# Patient Record
Sex: Female | Born: 1950 | Race: White | Hispanic: No | Marital: Married | Smoking: Current every day smoker
Health system: Southern US, Community
[De-identification: ages and names within clinical notes are randomized; demographics above are authoritative.]

## PROBLEM LIST (undated history)

## (undated) DIAGNOSIS — H269 Unspecified cataract: Secondary | ICD-10-CM

## (undated) DIAGNOSIS — S62109A Fracture of unspecified carpal bone, unspecified wrist, initial encounter for closed fracture: Secondary | ICD-10-CM

## (undated) DIAGNOSIS — D126 Benign neoplasm of colon, unspecified: Secondary | ICD-10-CM

## (undated) DIAGNOSIS — E785 Hyperlipidemia, unspecified: Secondary | ICD-10-CM

## (undated) DIAGNOSIS — M5030 Other cervical disc degeneration, unspecified cervical region: Secondary | ICD-10-CM

## (undated) DIAGNOSIS — N6019 Diffuse cystic mastopathy of unspecified breast: Secondary | ICD-10-CM

## (undated) DIAGNOSIS — K219 Gastro-esophageal reflux disease without esophagitis: Secondary | ICD-10-CM

## (undated) DIAGNOSIS — R55 Syncope and collapse: Secondary | ICD-10-CM

## (undated) DIAGNOSIS — I10 Essential (primary) hypertension: Secondary | ICD-10-CM

## (undated) DIAGNOSIS — J439 Emphysema, unspecified: Secondary | ICD-10-CM

## (undated) DIAGNOSIS — T7840XA Allergy, unspecified, initial encounter: Secondary | ICD-10-CM

## (undated) DIAGNOSIS — N3010 Interstitial cystitis (chronic) without hematuria: Secondary | ICD-10-CM

## (undated) DIAGNOSIS — F419 Anxiety disorder, unspecified: Secondary | ICD-10-CM

## (undated) DIAGNOSIS — D649 Anemia, unspecified: Secondary | ICD-10-CM

## (undated) DIAGNOSIS — M8580 Other specified disorders of bone density and structure, unspecified site: Secondary | ICD-10-CM

## (undated) DIAGNOSIS — J302 Other seasonal allergic rhinitis: Secondary | ICD-10-CM

## (undated) HISTORY — DX: Benign neoplasm of colon, unspecified: D12.6

## (undated) HISTORY — DX: Hyperlipidemia, unspecified: E78.5

## (undated) HISTORY — PX: WISDOM TOOTH EXTRACTION: SHX21

## (undated) HISTORY — PX: CATARACT EXTRACTION, BILATERAL: SHX1313

## (undated) HISTORY — DX: Interstitial cystitis (chronic) without hematuria: N30.10

## (undated) HISTORY — DX: Other seasonal allergic rhinitis: J30.2

## (undated) HISTORY — PX: COLONOSCOPY W/ POLYPECTOMY: SHX1380

## (undated) HISTORY — DX: Unspecified cataract: H26.9

## (undated) HISTORY — DX: Anemia, unspecified: D64.9

## (undated) HISTORY — PX: CERVICAL FUSION: SHX112

## (undated) HISTORY — DX: Fracture of unspecified carpal bone, unspecified wrist, initial encounter for closed fracture: S62.109A

## (undated) HISTORY — DX: Other specified disorders of bone density and structure, unspecified site: M85.80

## (undated) HISTORY — DX: Essential (primary) hypertension: I10

## (undated) HISTORY — DX: Syncope and collapse: R55

## (undated) HISTORY — DX: Emphysema, unspecified: J43.9

## (undated) HISTORY — PX: OTHER SURGICAL HISTORY: SHX169

## (undated) HISTORY — DX: Gastro-esophageal reflux disease without esophagitis: K21.9

## (undated) HISTORY — DX: Allergy, unspecified, initial encounter: T78.40XA

## (undated) HISTORY — DX: Anxiety disorder, unspecified: F41.9

## (undated) HISTORY — PX: TUBAL LIGATION: SHX77

## (undated) HISTORY — PX: SEPTOPLASTY: SUR1290

## (undated) HISTORY — DX: Other cervical disc degeneration, unspecified cervical region: M50.30

## (undated) HISTORY — DX: Diffuse cystic mastopathy of unspecified breast: N60.19

---

## 1994-07-14 HISTORY — PX: ABDOMINAL HYSTERECTOMY: SHX81

## 1997-12-01 ENCOUNTER — Other Ambulatory Visit: Admission: RE | Admit: 1997-12-01 | Discharge: 1997-12-01 | Payer: Self-pay | Admitting: Obstetrics and Gynecology

## 1998-05-30 ENCOUNTER — Encounter: Payer: Self-pay | Admitting: Emergency Medicine

## 1998-05-30 ENCOUNTER — Emergency Department (HOSPITAL_COMMUNITY): Admission: EM | Admit: 1998-05-30 | Discharge: 1998-05-30 | Payer: Self-pay | Admitting: Emergency Medicine

## 1998-12-17 ENCOUNTER — Other Ambulatory Visit: Admission: RE | Admit: 1998-12-17 | Discharge: 1998-12-17 | Payer: Self-pay | Admitting: Obstetrics and Gynecology

## 1999-08-12 ENCOUNTER — Inpatient Hospital Stay (HOSPITAL_COMMUNITY): Admission: RE | Admit: 1999-08-12 | Discharge: 1999-08-13 | Payer: Self-pay | Admitting: *Deleted

## 1999-08-12 ENCOUNTER — Encounter: Payer: Self-pay | Admitting: *Deleted

## 1999-09-18 ENCOUNTER — Encounter: Payer: Self-pay | Admitting: *Deleted

## 1999-09-18 ENCOUNTER — Ambulatory Visit (HOSPITAL_COMMUNITY): Admission: RE | Admit: 1999-09-18 | Discharge: 1999-09-18 | Payer: Self-pay | Admitting: *Deleted

## 1999-10-09 ENCOUNTER — Encounter: Payer: Self-pay | Admitting: *Deleted

## 1999-10-09 ENCOUNTER — Ambulatory Visit (HOSPITAL_COMMUNITY): Admission: RE | Admit: 1999-10-09 | Discharge: 1999-10-09 | Payer: Self-pay | Admitting: *Deleted

## 1999-12-04 ENCOUNTER — Encounter: Payer: Self-pay | Admitting: *Deleted

## 1999-12-04 ENCOUNTER — Ambulatory Visit (HOSPITAL_COMMUNITY): Admission: RE | Admit: 1999-12-04 | Discharge: 1999-12-04 | Payer: Self-pay | Admitting: *Deleted

## 1999-12-26 ENCOUNTER — Other Ambulatory Visit: Admission: RE | Admit: 1999-12-26 | Discharge: 1999-12-26 | Payer: Self-pay | Admitting: Obstetrics and Gynecology

## 2000-05-30 ENCOUNTER — Emergency Department (HOSPITAL_COMMUNITY): Admission: EM | Admit: 2000-05-30 | Discharge: 2000-05-30 | Payer: Self-pay | Admitting: Emergency Medicine

## 2000-05-30 ENCOUNTER — Encounter: Payer: Self-pay | Admitting: Emergency Medicine

## 2000-07-25 ENCOUNTER — Emergency Department (HOSPITAL_COMMUNITY): Admission: EM | Admit: 2000-07-25 | Discharge: 2000-07-25 | Payer: Self-pay | Admitting: *Deleted

## 2000-12-28 ENCOUNTER — Other Ambulatory Visit: Admission: RE | Admit: 2000-12-28 | Discharge: 2000-12-28 | Payer: Self-pay | Admitting: Obstetrics and Gynecology

## 2003-09-14 ENCOUNTER — Emergency Department (HOSPITAL_COMMUNITY): Admission: EM | Admit: 2003-09-14 | Discharge: 2003-09-15 | Payer: Self-pay

## 2004-09-13 ENCOUNTER — Ambulatory Visit: Payer: Self-pay | Admitting: Internal Medicine

## 2004-09-16 ENCOUNTER — Ambulatory Visit: Payer: Self-pay | Admitting: Internal Medicine

## 2005-07-14 DIAGNOSIS — R55 Syncope and collapse: Secondary | ICD-10-CM

## 2005-07-14 HISTORY — DX: Syncope and collapse: R55

## 2005-09-12 ENCOUNTER — Ambulatory Visit: Payer: Self-pay | Admitting: Internal Medicine

## 2005-09-30 ENCOUNTER — Emergency Department (HOSPITAL_COMMUNITY): Admission: EM | Admit: 2005-09-30 | Discharge: 2005-09-30 | Payer: Self-pay | Admitting: Emergency Medicine

## 2005-10-01 ENCOUNTER — Ambulatory Visit: Payer: Self-pay | Admitting: Internal Medicine

## 2005-10-16 ENCOUNTER — Ambulatory Visit: Payer: Self-pay | Admitting: Internal Medicine

## 2005-10-28 ENCOUNTER — Encounter (INDEPENDENT_AMBULATORY_CARE_PROVIDER_SITE_OTHER): Payer: Self-pay | Admitting: Specialist

## 2005-10-28 ENCOUNTER — Ambulatory Visit: Payer: Self-pay | Admitting: Internal Medicine

## 2006-09-09 ENCOUNTER — Ambulatory Visit: Payer: Self-pay | Admitting: Internal Medicine

## 2006-09-09 LAB — CONVERTED CEMR LAB
ALT: 22 units/L (ref 0–40)
Basophils Absolute: 0 10*3/uL (ref 0.0–0.1)
CO2: 29 meq/L (ref 19–32)
Calcium: 9.4 mg/dL (ref 8.4–10.5)
Cholesterol: 195 mg/dL (ref 0–200)
Eosinophils Absolute: 0.1 10*3/uL (ref 0.0–0.6)
Eosinophils Relative: 0.7 % (ref 0.0–5.0)
Free T4: 0.8 ng/dL (ref 0.6–1.6)
GFR calc non Af Amer: 92 mL/min
HDL: 63.5 mg/dL (ref >39.0)
MCV: 92.9 fL (ref 78.0–100.0)
Monocytes Absolute: 0.3 10*3/uL (ref 0.2–0.7)
Monocytes Relative: 4.7 % (ref 3.0–11.0)
Neutro Abs: 4.8 10*3/uL (ref 1.4–7.7)
Potassium: 4.1 meq/L (ref 3.5–5.1)
Sodium: 142 meq/L (ref 135–145)
T3, Free: 3 pg/mL (ref 2.3–4.2)
TSH: 0.9 microintl units/mL (ref 0.35–5.50)
Total CHOL/HDL Ratio: 3.1

## 2006-09-21 ENCOUNTER — Ambulatory Visit: Payer: Self-pay | Admitting: Internal Medicine

## 2006-12-01 ENCOUNTER — Ambulatory Visit: Payer: Self-pay | Admitting: Family Medicine

## 2006-12-17 ENCOUNTER — Encounter: Payer: Self-pay | Admitting: Internal Medicine

## 2007-05-24 ENCOUNTER — Telehealth (INDEPENDENT_AMBULATORY_CARE_PROVIDER_SITE_OTHER): Payer: Self-pay | Admitting: *Deleted

## 2007-05-27 DIAGNOSIS — N809 Endometriosis, unspecified: Secondary | ICD-10-CM | POA: Insufficient documentation

## 2007-05-27 DIAGNOSIS — N83209 Unspecified ovarian cyst, unspecified side: Secondary | ICD-10-CM | POA: Insufficient documentation

## 2007-05-27 DIAGNOSIS — M949 Disorder of cartilage, unspecified: Secondary | ICD-10-CM

## 2007-05-27 DIAGNOSIS — I10 Essential (primary) hypertension: Secondary | ICD-10-CM | POA: Insufficient documentation

## 2007-05-27 DIAGNOSIS — M899 Disorder of bone, unspecified: Secondary | ICD-10-CM | POA: Insufficient documentation

## 2007-06-25 ENCOUNTER — Encounter: Payer: Self-pay | Admitting: Internal Medicine

## 2007-07-23 ENCOUNTER — Encounter: Payer: Self-pay | Admitting: Internal Medicine

## 2007-07-29 ENCOUNTER — Ambulatory Visit: Payer: Self-pay | Admitting: Internal Medicine

## 2007-07-29 DIAGNOSIS — N3010 Interstitial cystitis (chronic) without hematuria: Secondary | ICD-10-CM | POA: Insufficient documentation

## 2008-03-07 ENCOUNTER — Ambulatory Visit: Payer: Self-pay | Admitting: Internal Medicine

## 2008-03-07 DIAGNOSIS — R1310 Dysphagia, unspecified: Secondary | ICD-10-CM

## 2008-03-07 DIAGNOSIS — Z720 Tobacco use: Secondary | ICD-10-CM

## 2008-03-07 DIAGNOSIS — F17210 Nicotine dependence, cigarettes, uncomplicated: Secondary | ICD-10-CM | POA: Insufficient documentation

## 2008-03-07 DIAGNOSIS — E785 Hyperlipidemia, unspecified: Secondary | ICD-10-CM | POA: Insufficient documentation

## 2008-03-07 DIAGNOSIS — R1319 Other dysphagia: Secondary | ICD-10-CM | POA: Insufficient documentation

## 2008-03-16 ENCOUNTER — Ambulatory Visit: Payer: Self-pay | Admitting: Internal Medicine

## 2008-03-17 ENCOUNTER — Ambulatory Visit: Payer: Self-pay | Admitting: Internal Medicine

## 2008-03-17 ENCOUNTER — Encounter (INDEPENDENT_AMBULATORY_CARE_PROVIDER_SITE_OTHER): Payer: Self-pay | Admitting: *Deleted

## 2008-03-17 LAB — CONVERTED CEMR LAB: OCCULT 1: NEGATIVE

## 2008-03-18 ENCOUNTER — Encounter (INDEPENDENT_AMBULATORY_CARE_PROVIDER_SITE_OTHER): Payer: Self-pay | Admitting: *Deleted

## 2008-03-21 ENCOUNTER — Encounter (INDEPENDENT_AMBULATORY_CARE_PROVIDER_SITE_OTHER): Payer: Self-pay | Admitting: *Deleted

## 2008-03-21 ENCOUNTER — Telehealth (INDEPENDENT_AMBULATORY_CARE_PROVIDER_SITE_OTHER): Payer: Self-pay | Admitting: *Deleted

## 2008-03-22 ENCOUNTER — Encounter: Admission: RE | Admit: 2008-03-22 | Discharge: 2008-03-22 | Payer: Self-pay | Admitting: Obstetrics and Gynecology

## 2008-03-27 ENCOUNTER — Encounter: Admission: RE | Admit: 2008-03-27 | Discharge: 2008-03-27 | Payer: Self-pay | Admitting: Internal Medicine

## 2008-03-28 ENCOUNTER — Telehealth (INDEPENDENT_AMBULATORY_CARE_PROVIDER_SITE_OTHER): Payer: Self-pay | Admitting: *Deleted

## 2008-03-28 ENCOUNTER — Encounter (INDEPENDENT_AMBULATORY_CARE_PROVIDER_SITE_OTHER): Payer: Self-pay | Admitting: *Deleted

## 2008-03-28 DIAGNOSIS — E041 Nontoxic single thyroid nodule: Secondary | ICD-10-CM | POA: Insufficient documentation

## 2008-04-06 ENCOUNTER — Encounter: Admission: RE | Admit: 2008-04-06 | Discharge: 2008-04-06 | Payer: Self-pay | Admitting: Internal Medicine

## 2008-04-11 ENCOUNTER — Telehealth (INDEPENDENT_AMBULATORY_CARE_PROVIDER_SITE_OTHER): Payer: Self-pay | Admitting: *Deleted

## 2008-09-14 ENCOUNTER — Encounter: Admission: RE | Admit: 2008-09-14 | Discharge: 2008-09-14 | Payer: Self-pay | Admitting: Obstetrics and Gynecology

## 2008-09-19 ENCOUNTER — Ambulatory Visit: Payer: Self-pay | Admitting: Internal Medicine

## 2008-09-19 ENCOUNTER — Telehealth (INDEPENDENT_AMBULATORY_CARE_PROVIDER_SITE_OTHER): Payer: Self-pay | Admitting: *Deleted

## 2008-09-19 DIAGNOSIS — Z860100 Personal history of colon polyps, unspecified: Secondary | ICD-10-CM | POA: Insufficient documentation

## 2008-09-19 DIAGNOSIS — F411 Generalized anxiety disorder: Secondary | ICD-10-CM | POA: Insufficient documentation

## 2008-11-16 ENCOUNTER — Encounter: Payer: Self-pay | Admitting: Internal Medicine

## 2009-01-16 ENCOUNTER — Encounter: Payer: Self-pay | Admitting: Internal Medicine

## 2009-01-16 ENCOUNTER — Ambulatory Visit: Payer: Self-pay | Admitting: Family Medicine

## 2009-02-05 ENCOUNTER — Encounter (INDEPENDENT_AMBULATORY_CARE_PROVIDER_SITE_OTHER): Payer: Self-pay | Admitting: *Deleted

## 2009-03-20 ENCOUNTER — Encounter: Payer: Self-pay | Admitting: Internal Medicine

## 2009-03-20 ENCOUNTER — Ambulatory Visit: Payer: Self-pay | Admitting: Internal Medicine

## 2009-03-20 DIAGNOSIS — Z8669 Personal history of other diseases of the nervous system and sense organs: Secondary | ICD-10-CM | POA: Insufficient documentation

## 2009-03-22 ENCOUNTER — Encounter (INDEPENDENT_AMBULATORY_CARE_PROVIDER_SITE_OTHER): Payer: Self-pay | Admitting: *Deleted

## 2009-03-25 LAB — CONVERTED CEMR LAB
ALT: 16 units/L (ref 0–35)
CO2: 29 meq/L (ref 19–32)
Calcium: 9.2 mg/dL (ref 8.4–10.5)
Cholesterol: 189 mg/dL (ref 0–200)
Creatinine, Ser: 0.8 mg/dL (ref 0.4–1.2)
Eosinophils Relative: 1.1 % (ref 0.0–5.0)
GFR calc non Af Amer: 78.01 mL/min (ref >60)
Glucose, Bld: 82 mg/dL (ref 70–99)
HCT: 45.9 % (ref 36.0–46.0)
Hemoglobin: 15.7 g/dL — ABNORMAL HIGH (ref 12.0–15.0)
LDL Cholesterol: 112 mg/dL — ABNORMAL HIGH (ref 0–99)
Lymphs Abs: 2.1 10*3/uL (ref 0.7–4.0)
MCHC: 34.3 g/dL (ref 30.0–36.0)
Neutro Abs: 5.1 10*3/uL (ref 1.4–7.7)
Platelets: 165 10*3/uL (ref 150.0–400.0)
Total Protein: 7.2 g/dL (ref 6.0–8.3)
Vit D, 25-Hydroxy: 40 ng/mL (ref 30–89)
WBC: 7.7 10*3/uL (ref 4.5–10.5)

## 2009-03-27 ENCOUNTER — Encounter (INDEPENDENT_AMBULATORY_CARE_PROVIDER_SITE_OTHER): Payer: Self-pay | Admitting: *Deleted

## 2009-03-28 ENCOUNTER — Ambulatory Visit: Payer: Self-pay | Admitting: Internal Medicine

## 2009-03-28 ENCOUNTER — Encounter (INDEPENDENT_AMBULATORY_CARE_PROVIDER_SITE_OTHER): Payer: Self-pay | Admitting: *Deleted

## 2009-03-28 LAB — CONVERTED CEMR LAB: OCCULT 3: NEGATIVE

## 2009-04-02 ENCOUNTER — Telehealth (INDEPENDENT_AMBULATORY_CARE_PROVIDER_SITE_OTHER): Payer: Self-pay | Admitting: *Deleted

## 2009-04-03 ENCOUNTER — Encounter: Admission: RE | Admit: 2009-04-03 | Discharge: 2009-04-03 | Payer: Self-pay | Admitting: Obstetrics and Gynecology

## 2009-04-23 ENCOUNTER — Ambulatory Visit: Payer: Self-pay | Admitting: Internal Medicine

## 2009-05-01 ENCOUNTER — Ambulatory Visit (HOSPITAL_COMMUNITY): Admission: RE | Admit: 2009-05-01 | Discharge: 2009-05-01 | Payer: Self-pay | Admitting: Internal Medicine

## 2009-05-02 ENCOUNTER — Encounter (INDEPENDENT_AMBULATORY_CARE_PROVIDER_SITE_OTHER): Payer: Self-pay

## 2009-05-04 ENCOUNTER — Ambulatory Visit: Payer: Self-pay | Admitting: Internal Medicine

## 2009-05-15 ENCOUNTER — Ambulatory Visit: Payer: Self-pay | Admitting: Internal Medicine

## 2009-05-15 ENCOUNTER — Encounter: Payer: Self-pay | Admitting: Internal Medicine

## 2009-05-20 ENCOUNTER — Encounter: Payer: Self-pay | Admitting: Internal Medicine

## 2009-05-24 ENCOUNTER — Encounter: Payer: Self-pay | Admitting: Internal Medicine

## 2009-09-18 ENCOUNTER — Telehealth (INDEPENDENT_AMBULATORY_CARE_PROVIDER_SITE_OTHER): Payer: Self-pay | Admitting: *Deleted

## 2010-01-24 ENCOUNTER — Telehealth: Payer: Self-pay | Admitting: Internal Medicine

## 2010-03-14 ENCOUNTER — Telehealth (INDEPENDENT_AMBULATORY_CARE_PROVIDER_SITE_OTHER): Payer: Self-pay | Admitting: *Deleted

## 2010-05-09 ENCOUNTER — Encounter: Payer: Self-pay | Admitting: Internal Medicine

## 2010-05-09 ENCOUNTER — Ambulatory Visit: Payer: Self-pay | Admitting: Internal Medicine

## 2010-05-10 ENCOUNTER — Encounter: Payer: Self-pay | Admitting: Internal Medicine

## 2010-05-13 LAB — CONVERTED CEMR LAB
AST: 20 units/L (ref 0–37)
BUN: 9 mg/dL (ref 6–23)
Chloride: 100 meq/L (ref 96–112)
Eosinophils Absolute: 0.1 10*3/uL (ref 0.0–0.7)
Eosinophils Relative: 1.7 % (ref 0.0–5.0)
GFR calc non Af Amer: 92.18 mL/min (ref >60)
Glucose, Bld: 68 mg/dL — ABNORMAL LOW (ref 70–99)
HDL: 66.3 mg/dL (ref >39.00)
LDL Cholesterol: 95 mg/dL (ref 0–99)
Monocytes Relative: 4.9 % (ref 3.0–12.0)
Neutro Abs: 4.9 10*3/uL (ref 1.4–7.7)
Neutrophils Relative %: 65.1 % (ref 43.0–77.0)
Platelets: 175 10*3/uL (ref 150.0–400.0)
Potassium: 3.9 meq/L (ref 3.5–5.1)
RDW: 14 % (ref 11.5–14.6)
Sodium: 134 meq/L — ABNORMAL LOW (ref 135–145)
TSH: 0.63 microintl units/mL (ref 0.35–5.50)
Triglycerides: 104 mg/dL (ref 0.0–149.0)
VLDL: 20.8 mg/dL (ref 0.0–40.0)
Vit D, 25-Hydroxy: 70 ng/mL (ref 30–89)
WBC: 7.5 10*3/uL (ref 4.5–10.5)

## 2010-05-23 ENCOUNTER — Encounter: Payer: Self-pay | Admitting: Internal Medicine

## 2010-07-04 ENCOUNTER — Encounter: Payer: Self-pay | Admitting: Internal Medicine

## 2010-08-05 ENCOUNTER — Encounter: Payer: Self-pay | Admitting: Internal Medicine

## 2010-08-11 LAB — CONVERTED CEMR LAB
AST: 15 units/L (ref 0–37)
BUN: 11 mg/dL (ref 6–23)
Basophils Absolute: 0 10*3/uL (ref 0.0–0.1)
Basophils Relative: 0.6 % (ref 0.0–3.0)
Bilirubin, Direct: 0.1 mg/dL (ref 0.0–0.3)
CO2: 30 meq/L (ref 19–32)
Cholesterol: 162 mg/dL (ref 0–200)
Creatinine, Ser: 0.7 mg/dL (ref 0.4–1.2)
Glucose, Bld: 85 mg/dL (ref 70–99)
Lymphocytes Relative: 24.5 % (ref 12.0–46.0)
MCV: 95.5 fL (ref 78.0–100.0)
Monocytes Absolute: 0.3 10*3/uL (ref 0.1–1.0)
Monocytes Relative: 4.6 % (ref 3.0–12.0)
Neutro Abs: 4.7 10*3/uL (ref 1.4–7.7)
Neutrophils Relative %: 68.8 % (ref 43.0–77.0)
Potassium: 4.4 meq/L (ref 3.5–5.1)
Total Bilirubin: 0.8 mg/dL (ref 0.3–1.2)
Triglycerides: 134 mg/dL (ref 0–149)
WBC: 6.8 10*3/uL (ref 4.5–10.5)

## 2010-08-13 NOTE — Progress Notes (Signed)
Summary: NEEDS [facility] FOR LORAZAPAM  Phone Note Call from Patient Call back at Home Phone [phone]   Caller: Patient Summary of Call: SEES DR ON OCTOBER 27TH---OUT OF LORAZAPAM NOW------HAS FIVE DAYS LEFT ---PLEASE FAX REQUEST TO [facility] AS SOON AS POSSIBLE Initial call taken by: Jerolyn Shin,  March 14, 2010 10:40 AM    Prescriptions: LORAZEPAM 0.5 MG  TABS (LORAZEPAM) 1 q 6-8 hrs as needed only; not maintenance med  #90 x 0   Entered by:   Shonna Chock CMA   Authorized by:   Marga Melnick MD   Signed by:   Shonna Chock CMA on 03/14/2010   Method used:   Printed then faxed to ...       [facility] (retail)             ,          Ph: [phone]       Fax: [phone]   RxID:   [id number]

## 2010-08-13 NOTE — Letter (Signed)
Summary: [facility]  [facility]   Imported By: Lanelle Bal 06/04/2010 10:05:49  _____________________________________________________________________  External Attachment:    Type:   Image     Comment:   External Document

## 2010-08-13 NOTE — Progress Notes (Signed)
Summary: Lorazepam & Gum  Phone Note Call from Patient   Caller: Patient Summary of Call: pt states that she will be starting a quit class tomorrow for her smoking. she will be using the gum to help her stop but she is taking lorazepam. she was told to ask if she can take the lorazepam and the gum at the same time or will it mess her up? Please advise [phone] or [phone]. Initial call taken by: Lavell Islam,  January 24, 2010 9:27 AM  Follow-up for Phone Call        Patient notified that per MD she can use the two together. Lucious Groves CMA  January 24, 2010 12:53 PM

## 2010-08-13 NOTE — Assessment & Plan Note (Signed)
Summary: cpx/fasting//kn   Vital Signs:  Patient profile:   60 year old female Height:      62.5 inches Weight:      88.4 pounds BMI:     15.97 Temp:     98.2 degrees F oral Pulse rate:   60 / minute Resp:     14 per minute BP sitting:   130 / 80  (left arm) Cuff size:   regular  Vitals Entered By: Shonna Chock CMA (May 09, 2010 9:48 AM)  CC: CPX with fasting labs , General Medical Evaluation, Lipid Management   Primary Care Provider:  Marga Melnick, MD  CC:  CPX with fasting labs , General Medical Evaluation, and Lipid Management.  History of Present Illness:        Jennifer Stephenson is here for a physical; she is asymptomatic. The patient reports headaches related to chronic neck issues and  occasional ankle edema, but denies lightheadedness, urinary frequency, and fatigue.  Associated symptoms include dyspnea @ high levels of exercise.  The patient denies the following associated symptoms: chest pain, chest pressure, exercise intolerance, palpitations, and syncope.  Compliance with medications (by patient report) has been near 100%.  The patient reports that dietary compliance has been poor.  The patient reports exercising 3-4X per week as walking , calisthentics.  Currently NOT  employing  salt restriction.   BP @ home 120/70 on average.  Lipid Management History:      Positive NCEP/ATP III risk factors include female age 25 years old or older, current tobacco user, and hypertension.  Negative NCEP/ATP III risk factors include no history of early menopause without estrogen hormone replacement, non-diabetic, no family history for ischemic heart disease, no ASHD (atherosclerotic heart disease), no prior stroke/TIA, no peripheral vascular disease, and no history of aortic aneurysm.     Current Medications (verified): 1)  Lorazepam 0.5 Mg  Tabs (Lorazepam) .Marland Kitchen.. 1 Q 6-8 Hrs As Needed Only; Not Maintenance Med 2)  Premarin 0.625 Mg  Tabs (Estrogens Conjugated) .... Daily 3)  Diltiazem Hcl  Coated Beads 180 Mg  Cp24 (Diltiazem Hcl Coated Beads) .Marland Kitchen.. 1 By Mouth Qd 4)  Boniva 150 Mg  Tabs (Ibandronate Sodium) .Marland Kitchen.. 1 By Mouth Qmonth 5)  Gabapentin 100 Mg Caps (Gabapentin) .Marland Kitchen.. 1 Q 8 Hrs As Needed Headache 6)  Vitamin D 400 Unit Tabs (Cholecalciferol) .... Once Daily 7)  Vitamin C 500 Mg Chew (Ascorbic Acid) .... Take 2 Chewables Daily 8)  Calcium-Vitamin D 600-125 Mg-Unit Tabs (Calcium-Vitamin D) .... Take 2 Tablets Daily  Allergies: 1)  ! Paxil 2)  ! Prednisone 3)  ! Wellbutrin 4)  ! Sulfa 5)  ! Talwin  Past History:  Past Medical History: Anemia, PMH of  Hypertension Osteopenia (vit D level 52 in 2008); S/P wrist fractures bilaterally  @ age 63 (skating)Note: on Bisphoshonates 10 years,D/Ced 07/2009 Endometriosis ; Ovarian cyst, Dr Henderson Cloud , Gyn Fibrocytic breast disease , S/P 2 biopsies Hyperlipidemia: LDL   goal = < 100 based on NMR Lipoprofile. Framingham Study LDL goal = < 130. Interstitial cystitis, Dr Isabel Caprice Colonic polyps, hx of adenomas 2004 & 2007, Dr Leone Payor DDD of cervical spine  Past Surgical History: Syncope 2007(ER ); recurrence 2010, ? related to heat exhaustion ( no evaluation) Cervical fusion X2 , 2001 C4-6 , Dr Gasper Sells; 2007 C6-7, Dr Lamount Cohen Colon polypectomy (adenomatous) 2004, 2007 Hysterectomy & BSO for Endometriosis, Dr Laureen Ochs 1996 Septoplasty; Laser for retinal bleed EGD with esophageal dilation  2010, Dr Leone Payor  [facility]  History: Father: CHF, COAD Mother: dementia Siblings: sister: ulcer;bro: ulcer;bro: CAD; bro :pancreatic cancer; 2 bro: DM,CVA MGM: cns cancer ; P uncle: DM; M uncle: ? prostate  cancer  Social History: no diet Current Smoker: 1/2 ppd or > Alcohol use-no Regular exercise-yes Occupation: Disabled due to neck Daily Caffeine Use -3 cups / day   Review of Systems  The patient denies anorexia, fever, weight loss, weight gain, vision loss, decreased hearing, hoarseness, abdominal pain, melena, hematochezia, severe  indigestion/heartburn, hematuria, suspicious skin lesions, depression, unusual weight change, abnormal bleeding, enlarged lymph nodes, and angioedema.         Easy bruising. Difficulty swallowing saliva w/o frank dysphagia.  Resp:  Denies chest pain with inspiration, cough, coughing up blood, shortness of breath, sputum productive, and wheezing.  Physical Exam  General:  Thin,in no acute distress; alert,appropriate and cooperative throughout examination Head:  Normocephalic and atraumatic without obvious abnormalities.  Eyes:  No corneal or conjunctival inflammation noted. Perrla. Funduscopic exam benign, without hemorrhages, exudates or papilledema.  Ears:  External ear exam shows no significant lesions or deformities.  Otoscopic examination reveals clear canals, tympanic membranes are intact bilaterally without bulging, retraction, inflammation or discharge. Hearing is grossly normal bilaterally. Nose:  External nasal examination shows no deformity or inflammation. Nasal mucosa are pink and moist without lesions or exudates. Mouth:  Oral mucosa and oropharynx without lesions or exudates.  Dentures Neck:  No deformities, masses, or tenderness noted. Thyroid asymmetric w/o palpable nodule Lungs:  Normal respiratory effort, chest expands symmetrically. Lungs are clear to auscultation, no crackles or wheezes. Heart:  Normal rate and regular rhythm. S1 and S2 normal without gallop, murmur, click, rub or other extra sounds. Abdomen:  Bowel sounds positive,abdomen soft and non-tender without masses, organomegaly or hernias noted. Aorta palpable w/o AAA Genitalia:  Dr Henderson Cloud Msk:  No deformity or scoliosis noted of thoracic or lumbar spine.   Pulses:  R and L carotid,radial and posterior tibial pulses are full and equal bilaterally. Decreased DPP Extremities:  No clubbing, cyanosis, edema, or deformity noted with normal full range of motion of all joints.   Neurologic:  alert & oriented X3 and DTRs  symmetrical and normal.   Skin:  Nevus  22X 15 R cheek Cervical Nodes:  No lymphadenopathy noted Axillary Nodes:  No palpable lymphadenopathy Psych:  memory intact for recent and remote, normally interactive, and good eye contact.     Impression & Recommendations:  Problem # 1:  ROUTINE GENERAL MEDICAL EXAM@HEALTH  CARE FACL (ICD-V70.0)  Orders: EKG w/ Interpretation (93000) Venipuncture (14782) TLB-Lipid Panel (80061-LIPID) TLB-BMP (Basic Metabolic Panel-BMET) (80048-METABOL) TLB-CBC Platelet - w/Differential (85025-CBCD) TLB-Hepatic/Liver Function Pnl (80076-HEPATIC) TLB-TSH (Thyroid Stimulating Hormone) (84443-TSH) T-Vitamin D (25-Hydroxy) (95621-30865) T-2 View CXR (71020TC)  Problem # 2:  HYPERTENSION (ICD-401.9) controlled Her updated medication list for this problem includes:    Diltiazem Hcl Coated Beads 180 Mg Cp24 (Diltiazem hcl coated beads) .Marland Kitchen... 1 by mouth qd  Problem # 3:  HYPERLIPIDEMIA (ICD-272.4)  Problem # 4:  CIGARETTE SMOKER (ICD-305.1)  risks discussed  Orders: T-2 View CXR (71020TC)  Problem # 5:  OSTEOPENIA (ICD-733.90)  The following medications were removed from the medication list:    Boniva 150 Mg Tabs (Ibandronate sodium) .Marland Kitchen... 1 by mouth qmonth  Orders: T-Vitamin D (25-Hydroxy) (78469-62952)  Complete Medication List: 1)  Lorazepam 0.5 Mg Tabs (Lorazepam) .Marland Kitchen.. 1 q 6-8 hrs as needed only; not maintenance med 2)  Premarin 0.625 Mg Tabs (Estrogens conjugated) .... Daily 3)  Diltiazem  Hcl Coated Beads 180 Mg Cp24 (Diltiazem hcl coated beads) .Marland Kitchen.. 1 by mouth qd 4)  Gabapentin 100 Mg Caps (Gabapentin) .Marland Kitchen.. 1 q 8 hrs as needed headache 5)  Vitamin D 400 Unit Tabs (Cholecalciferol) .... Once daily 6)  Vitamin C 500 Mg Chew (Ascorbic acid) .... Take 2 chewables daily 7)  Calcium-vitamin D 600-125 Mg-unit Tabs (Calcium-vitamin d) .... Take 2 tablets daily 8)  Hydrocodone-acetaminophen 7.5-500 Mg Tabs (Hydrocodone-acetaminophen) .... 1/2 -1 by  mouth three times a day as needed for pain  Other Orders: Admin 1st Vaccine (76160) Flu Vaccine 67yrs + (73710)  Lipid Assessment/Plan:      Based on NCEP/ATP III, the patient's risk factor category is "2 or more risk factors and a calculated 10 year CAD risk of < 20%".  The patient's lipid goals are as follows: Total cholesterol goal is 200; LDL cholesterol goal is 100; HDL cholesterol goal is 50; Triglyceride goal is 150.  Her LDL cholesterol goal has not been met.  Secondary causes for hyperlipidemia have been ruled out.  She has been counseled on adjunctive measures for lowering her cholesterol and has been provided with dietary instructions.    Patient Instructions: 1)  Stop Smoking Tips: Choose a Quit date. Cut down before the Quit date. decide what you will do as a substitute when you feel the urge to smoke(gum,toothpick,exercise). YOU WILL SUCCEED !  Prescriptions: DILTIAZEM HCL COATED BEADS 180 MG  CP24 (DILTIAZEM HCL COATED BEADS) 1 by mouth qd  #90 x 3   Entered and Authorized by:   Marga Melnick MD   Signed by:   Marga Melnick MD on 05/09/2010   Method used:   Print then Give to Patient   RxID:   [id number] LORAZEPAM 0.5 MG  TABS (LORAZEPAM) 1 q 6-8 hrs as needed only; not maintenance med  #90 x 0   Entered and Authorized by:   Marga Melnick MD   Signed by:   Marga Melnick MD on 05/09/2010   Method used:   Print then Give to Patient   RxID:   [id number]    Orders Added: 1)  Admin 1st Vaccine [90471] 2)  Flu Vaccine 38yrs + [93810] 3)  Est. Patient 40-64 years [99396] 4)  EKG w/ Interpretation [93000] 5)  Venipuncture [36415] 6)  TLB-Lipid Panel [80061-LIPID] 7)  TLB-BMP (Basic Metabolic Panel-BMET) [80048-METABOL] 8)  TLB-CBC Platelet - w/Differential [85025-CBCD] 9)  TLB-Hepatic/Liver Function Pnl [80076-HEPATIC] 10)  TLB-TSH (Thyroid Stimulating Hormone) [84443-TSH] 11)  T-Vitamin D (25-Hydroxy) [17510-25852] 12)  T-2 View CXR [71020TC] Flu Vaccine  Consent Questions     Do you have a history of severe allergic reactions to this vaccine? no    Any prior history of allergic reactions to egg and/or gelatin? no    Do you have a sensitivity to the preservative Thimersol? no    Do you have a past history of Guillan-Barre Syndrome? no    Do you currently have an acute febrile illness? no    Have you ever had a severe reaction to latex? no    Vaccine information given and explained to patient? yes    Are you currently pregnant? no    Lot Number:[id number]   Exp Date:01/11/2011   Site Given  Left Deltoid IM Added: 1)  Admin 1st Vaccine [90471] 2)  Flu Vaccine 71yrs + [77824]     .lbflu

## 2010-08-13 NOTE — Progress Notes (Signed)
Summary: Refill Request(Controlled Med)  Phone Note Refill Request Call back at Home Phone [phone] Message from:  Patient  Refills Requested: Medication #1:  LORAZEPAM 0.5 MG  TABS 1 q 6-8 hrs as needed only; not maintenance med  Medication #2:  DILTIAZEM HCL COATED BEADS 180 MG  CP24 1 by mouth qd [facility]  Initial call taken by: Shonna Chock,  September 18, 2009 10:50 AM  Follow-up for Phone Call        Last appointment 03/2009, Last Refill on Lorazepam was 03/2009 # 60 with 5 refills. Instructions indicate (NOT  a Maintenance Med)..  Dr.Hopper please advise on Lorazepam for a 90 day supply Follow-up by: Shonna Chock,  September 18, 2009 1:41 PM  Additional Follow-up for Phone Call Additional follow up Details #1::        1 q 8 -12 hrs as needed only; #90, RX2 Additional Follow-up by: Marga Melnick MD,  September 18, 2009 5:48 PM    Prescriptions: LORAZEPAM 0.5 MG  TABS (LORAZEPAM) 1 q 6-8 hrs as needed only; not maintenance med  #90 x 2   Entered by:   Shonna Chock   Authorized by:   Marga Melnick MD   Signed by:   Shonna Chock on 09/19/2009   Method used:   Printed then faxed to ...       [facility] (mail[facility]order)             ,          Ph: [phone]       Fax: [phone]   RxID:   [id number] DILTIAZEM HCL COATED BEADS 180 MG  CP24 (DILTIAZEM HCL COATED BEADS) 1 by mouth qd  #90 x 2   Entered by:   Shonna Chock   Authorized by:   Marga Melnick MD   Signed by:   Shonna Chock on 09/19/2009   Method used:   Printed then faxed to ...       [facility] (mail-order)             ,          Ph: [phone]       Fax: [phone]   RxID:   [id number]

## 2010-08-15 NOTE — Letter (Signed)
Summary: [facility]  [facility]   Imported By: Lanelle Bal 07/19/2010 10:45:23  _____________________________________________________________________  External Attachment:    Type:   Image     Comment:   External Document

## 2010-08-29 NOTE — Letter (Signed)
Summary: [facility]  [facility]   Imported By: Maryln Gottron 08/19/2010 15:50:35  _____________________________________________________________________  External Attachment:    Type:   Image     Comment:   External Document

## 2010-09-05 ENCOUNTER — Telehealth (INDEPENDENT_AMBULATORY_CARE_PROVIDER_SITE_OTHER): Payer: Self-pay | Admitting: *Deleted

## 2010-09-10 NOTE — Progress Notes (Signed)
Summary: Refill  Phone Note Refill Request Call back at Home Phone [phone] Message from:  Patient on September 05, 2010 12:26 PM  Refills Requested: Medication #1:  LORAZEPAM 0.5 MG  TABS 1 q 6-8 hrs as needed only; not maintenance med   Dosage confirmed as above?Dosage Confirmed   Supply Requested: 3 months [facility]  Next Appointment Scheduled: none Initial call taken by: Lavell Islam,  September 05, 2010 12:27 PM    Prescriptions: LORAZEPAM 0.5 MG  TABS (LORAZEPAM) 1 q 6-8 hrs as needed only; not maintenance med  #90 x 0   Entered by:   Shonna Chock CMA   Authorized by:   Marga Melnick MD   Signed by:   Shonna Chock CMA on 09/05/2010   Method used:   Printed then faxed to ...       [facility] (mail-order)             , [place]         Ph: [phone]       Fax: [phone]   RxID:   [id number]

## 2010-11-29 NOTE — Discharge Summary (Signed)
[place]. [facility]  Patient:    Jennifer Stephenson                       MRN: [id number] Adm. Date:  [id number] Disc. Date: [id number] Attending:  Evonnie Dawes CC:         Jearld Adjutant, M.D.                           Discharge Summary  ADMISSION DIAGNOSIS:  Cervical spondylosis with right cervical 5, and to a lesser degree, cervical radiculopathy.  HOSPITAL COURSE: This is a 60 year old whom I saw in my office with a mixed right C5 and C6 radiculopathy, and a clear cut rupture of the C4, 5 disk eccentric to the right and and spondylosis at C5-6.  The only that is disturbing in her history s that she has a history of bleeding.  The patient is on supplemental vitamin E. I performed a prothrombin time and partial thromboplastin time, and also a bleeding time on her; and, the bleeding time was normal as were the prothrombin time and  partial thromboplastin time, so it was probably the soft vitamin E.  The patient has clear cut of weakness of the deltoid muscles and to a lesser degree the supraspinatus, and some intermittent numbness and tingling involving the thumb.  For this reason I felt two levels were involved.  We performed an anterior approach and performed a C4-5 and C5-6 diskectomy, and  _____________ fused from C4 to C6 with Stryker graft and a Codman plate securely. Postoperative films showed excellent positioning.  The patient tolerated the procedure well and left surgery with voice intact, able to swallow and essentially no pain in the right arm.  The patient was discharged home.  She was given Vicodin on a p.r.n. basis for pain.  CONDITION AT DISCHARGE:  Markedly improved.  DISPOSITION:  Home. DD:  08/13/99 TD:  08/14/99 Job: [id number]

## 2010-11-29 NOTE — Op Note (Signed)
[place]. [facility]  Patient:    Jennifer Stephenson, Jennifer Stephenson                      MRN: [id number] Attending:  Donnal Debar. Gasper Sells, M.D.                           Operative Report  PREOPERATIVE DIAGNOSES:  Herniated nucleus pulposus, right L4-5, and tight foramen, right C5-6.  POSTOPERATIVE DIAGNOSES:  OPERATION: 1. C4-5 and C5-6 anterior ______ , microsurgical. 2. Corpectomy of C5 for harvesting of autograft. 3. Strut graft fusion, C4 to C6, using femoral cortical bone and lateral fusion    using Symphony autologous growth factor extraction substrate and the autograft    from the corpectomy of C5 at the C4-5 and C5-6 levels, bilaterally/bilaterally,    and finally fixation using Codman titanium plate, 38 mm, and 12-mm cancellous    screws x 6.  Intraoperative control x-ray confirmed the level.  SURGEON:  Ricky D. Gasper Sells, M.D.  ASSISTANT:  Annie Sable, Montez Hageman., M.D.  ANESTHESIA:  General controlled.  COMPLICATIONS:  Nil.  COUNTS:  Instrument count, sponge count and needle count correct.  SUMMARY:  This is a 60 year old female with a clear-cut right C5 radiculopathy s well as a C6 radiculopathy, mixed.  MRI showed a ruptured disc at C4-5 on the right and a tight foramen of C6 on the right.  It was felt that a C4 through C6 anterior decompression was required; this was carried out today in an uncomplicated fashion. Autologous growth factor harvested from the patients bone was then used. Patient tolerated the procedure well and awoke from surgery moving all four extremities  without her usual right arm pain.  Intraoperative control x-rays confirmed the appropriate level had been done.  DESCRIPTION OF PROCEDURE:  With the patient in the supine position, patient was  prepped and draped in the usual fashion for an anterior decompression of the cervical spine from the left side.  Bair Hugger blanket was in place as well as PAS hose.  A suitable  incision was made in the anterior triangle on the left in a convenient skin fold and subcutaneous dissection was carried out following hemostasis with  unipolar coagulation to expose approximately 8 cm of the platysma muscle, inferior and superiorly.  The platysma was then split along its muscle fibers and retracted medially and laterally with a total of four fishhooks and then dissection was carried out down to the carotid artery where a right-hand turn was made, exposing the anterior cervical spine.  Intraoperative control x-rays confirmed the level and then a Caspar pin was placed in the vertebral body of C4 and C6, with the retractor in place, and then the paraspinal muscles and ligaments, that is, longus colli ere dissected free of the anterior bodies of C4, C5 and C6, bilaterally, with Caspar retractors placed in so as to expose the C4 through C6 levels bilaterally.  A Leksell instrument was used to remove the anterior osteophytes of C4-5 and C5-6 and then diskectomies at C4-5 and C5-6 were carried out under the microscope using 15 blade knife followed by pituitary instruments.  A high-speed drill was then used to drill off the cartilage endplates of C4-5 and C5-6 and then a corpectomy of C5 as carried out with the Leksell instrument down to the cortical bone posteriorly.   Under the microscope, a diskectomy -- radical -- of C4-5 was carried out first  ith an eccentric-to-the-right rupture, which was identified.  C5 foraminotomies were carried out bilaterally and exactly the same procedure was carried out at C5-6,  except for the fact that was not a significant disc rupture.  Using a coronary artery dilator instrument, the foramina of C5 and C6 were cleared bilaterally.  A custom-made cortical bone strut was fashioned from femoral bone using the Midas Rex instrument and pounded into place.  The high-speed drill was then used to drill out the C4-5 and C5-6 levels  bilaterally; this was then packed with the patients own bone from the corpectomy as well as autologous growth factor extract, that s, platelets mixed with thrombin, having mixed the bone with that prior.  Once this was done, the Caspar pins were removed and a 38-mm plate was placed over the graft in the C4 through C6-7 levels and secured in place with six 12-mm Codman titanium screws.  The mid screws were between the cancellous bone and the strut so they eld it in place.  These were then locked in place.  An intraoperative control x-ray was done and the appropriate level had been done.   The remainder of the autologous  growth factor was injected under the plate to bathe the anterior graft in growth factor substrate.  Retractors were removed and hemostasis was achieved in the subcutaneous tissues and then the platysma was closed with three interrupted 2-0 Vicryl sutures and the skin was closed with 4-0 Vicryl inverted-interrupted x 4. Steri-Strips were used on the skin.  A Betadine ointment and dry dressings were  applied.  The patients Newton collar was placed on her and she left the operating room in good condition. DD:  08/12/99 TD:  08/13/99 Job: [id number]

## 2010-12-19 ENCOUNTER — Telehealth: Payer: Self-pay | Admitting: Internal Medicine

## 2010-12-19 MED ORDER — LORAZEPAM 0.5 MG PO TABS: ORAL_TABLET | ORAL | Status: DC

## 2010-12-19 NOTE — Telephone Encounter (Signed)
Patient needs new rx for lorazpam - [facility] - 90 day supply - she said pharmacy wont fax refill request

## 2010-12-19 NOTE — Telephone Encounter (Signed)
RX faxed to pharmacy.

## 2011-01-20 ENCOUNTER — Other Ambulatory Visit (HOSPITAL_COMMUNITY): Payer: Self-pay | Admitting: Otolaryngology

## 2011-01-24 ENCOUNTER — Ambulatory Visit (HOSPITAL_COMMUNITY)
Admission: RE | Admit: 2011-01-24 | Discharge: 2011-01-24 | Disposition: A | Discharge status: Home / Self Care | Payer: 59 | Source: Ambulatory Visit | Attending: Otolaryngology | Admitting: Otolaryngology

## 2011-01-24 DIAGNOSIS — R1310 Dysphagia, unspecified: Secondary | ICD-10-CM | POA: Insufficient documentation

## 2011-03-04 ENCOUNTER — Other Ambulatory Visit: Payer: Self-pay | Admitting: Internal Medicine

## 2011-03-04 NOTE — Telephone Encounter (Signed)
Patient need refill lorazapam .5 mg - [facility]  - cpx scheduled [id number]

## 2011-03-05 MED ORDER — LORAZEPAM 0.5 MG PO TABS: ORAL_TABLET | ORAL | Status: DC

## 2011-03-05 NOTE — Telephone Encounter (Signed)
RX sent to pharmacy  

## 2011-05-09 ENCOUNTER — Other Ambulatory Visit: Payer: Self-pay | Admitting: Obstetrics and Gynecology

## 2011-05-20 ENCOUNTER — Encounter: Payer: Self-pay | Admitting: Internal Medicine

## 2011-05-22 ENCOUNTER — Ambulatory Visit (INDEPENDENT_AMBULATORY_CARE_PROVIDER_SITE_OTHER)
Admission: RE | Admit: 2011-05-22 | Discharge: 2011-05-22 | Disposition: A | Discharge status: Home / Self Care | Payer: 59 | Source: Ambulatory Visit | Attending: Internal Medicine | Admitting: Internal Medicine

## 2011-05-22 ENCOUNTER — Encounter: Payer: Self-pay | Admitting: Internal Medicine

## 2011-05-22 ENCOUNTER — Ambulatory Visit (INDEPENDENT_AMBULATORY_CARE_PROVIDER_SITE_OTHER): Payer: 59 | Admitting: Internal Medicine

## 2011-05-22 VITALS — BP 122/70 | HR 86 | Temp 98.2°F | Resp 12 | Ht 62.25 in | Wt 84.2 lb

## 2011-05-22 DIAGNOSIS — E785 Hyperlipidemia, unspecified: Secondary | ICD-10-CM

## 2011-05-22 DIAGNOSIS — E041 Nontoxic single thyroid nodule: Secondary | ICD-10-CM

## 2011-05-22 DIAGNOSIS — Z86010 Personal history of colonic polyps: Secondary | ICD-10-CM

## 2011-05-22 DIAGNOSIS — N3010 Interstitial cystitis (chronic) without hematuria: Secondary | ICD-10-CM

## 2011-05-22 DIAGNOSIS — M899 Disorder of bone, unspecified: Secondary | ICD-10-CM

## 2011-05-22 DIAGNOSIS — Z23 Encounter for immunization: Secondary | ICD-10-CM

## 2011-05-22 DIAGNOSIS — R1319 Other dysphagia: Secondary | ICD-10-CM

## 2011-05-22 DIAGNOSIS — Z0000 Encounter for general adult medical examination without abnormal findings: Secondary | ICD-10-CM

## 2011-05-22 DIAGNOSIS — I10 Essential (primary) hypertension: Secondary | ICD-10-CM

## 2011-05-22 LAB — TSH: TSH: 0.94 u[IU]/mL (ref 0.35–5.50)

## 2011-05-22 LAB — HEPATIC FUNCTION PANEL
ALT: 15 U/L (ref 0–35)
Total Bilirubin: 0.7 mg/dL (ref 0.3–1.2)

## 2011-05-22 LAB — LIPID PANEL
LDL Cholesterol: 103 mg/dL — ABNORMAL HIGH (ref 0–99)
Total CHOL/HDL Ratio: 3
VLDL: 18.8 mg/dL (ref 0.0–40.0)

## 2011-05-22 LAB — BASIC METABOLIC PANEL
Chloride: 104 mEq/L (ref 96–112)
GFR: 79.74 mL/min (ref >60.00)
Potassium: 4.2 mEq/L (ref 3.5–5.1)
Sodium: 139 mEq/L (ref 135–145)

## 2011-05-22 LAB — CBC WITH DIFFERENTIAL/PLATELET
Basophils Relative: 0.6 % (ref 0.0–3.0)
Eosinophils Relative: 1.3 % (ref 0.0–5.0)
HCT: 45.6 % (ref 36.0–46.0)
Hemoglobin: 15.6 g/dL — ABNORMAL HIGH (ref 12.0–15.0)
Lymphs Abs: 2.2 10*3/uL (ref 0.7–4.0)
Monocytes Relative: 5.3 % (ref 3.0–12.0)
Neutro Abs: 5.6 10*3/uL (ref 1.4–7.7)
WBC: 8.4 10*3/uL (ref 4.5–10.5)

## 2011-05-22 MED ORDER — DILTIAZEM HCL ER 180 MG PO CP24: 180.0000 mg | ORAL_CAPSULE | Freq: Every day | ORAL | Status: DC

## 2011-05-22 MED ORDER — LORAZEPAM 0.5 MG PO TABS: ORAL_TABLET | ORAL | Status: DC

## 2011-05-22 NOTE — Progress Notes (Signed)
Subjective:    Patient ID: Jennifer Stephenson, female    DOB: 1951/02/09, 60 y.o.   MRN: [id number]  HPI  Jennifer Stephenson  is here for a physical;acute issues include ongoing shoulder pain treated by Dr Renae Fickle , Orthopedist.Cervical fusion by Dr Tyrone Sage, [facility] , did not relieve  neck pain, present since initial cervical procedure in 2001.       Review of Systems SHOULDER PAIN: Location: R    Onset: 06/2010   Severity: up to 3 Pain is described as: dull  Worse with: ROM    Better with: no relievers  Pain radiates to: no   Impaired range of motion: yes History of repetitive motion:  no  History of overuse or hyperextension:  no  History of trauma:  no   Symptoms Numbness/tingling:  no  Weakness:  yes, some in RUE Red Flags Fever:  no  Bowel/bladder dysfunction:  no       Objective:   Physical Exam Gen.: Extremely thin. Alert, appropriate and cooperative throughout exam. Head: Normocephalic without obvious abnormalities  Eyes: No corneal or conjunctival inflammation noted. Pupils equal round reactive to light and accommodation. Fundal exam is benign without hemorrhages, exudate, papilledema. Extraocular motion intact. Vision grossly normal with lenses. Ears: External  ear exam reveals no significant lesions or deformities. Canals clear .TMs normal. Hearing is grossly normal bilaterally. Nose: External nasal exam reveals no deformity or inflammation. Nasal mucosa are pink and moist. No lesions or exudates noted.   Mouth: Oral mucosa and oropharynx reveal no lesions or exudates. Dentures. Neck: No deformities, masses, or tenderness noted. Range of motion markedly decreased. Thyroid w/o nodules by palpation. Lungs: Normal respiratory effort; chest expands symmetrically. Lungs are clear to auscultation without rales, wheezes, or increased work of breathing. Heart: Normal rate and rhythm. Normal S1 and S2. No gallop, click, or rub. S 4 w/o  murmur. Abdomen: Bowel sounds normal; abdomen soft and  nontender. No masses, organomegaly or hernias noted.Aorta palpable w/o AAA Genitalia: Dr Henderson Cloud   .                                                                                   Musculoskeletal/extremities: No deformity or scoliosis noted of  the thoracic or lumbar spine. No clubbing, cyanosis, edema, or deformity noted. Range of motion  Norma except neck .Tone & strength  normal.Joints normal. Nail health  good. Vascular: Carotid, radial artery, dorsalis pedis and  posterior tibial pulses are full and equal. No bruits present. Neurologic: Alert and oriented x3. Deep tendon reflexes symmetrical and normal.          Skin: Intact without suspicious lesions or rashes. Lymph: No cervical, axillary lymphadenopathy present. Psych: Mood and affect are normal. 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 There is a Q wave in V1 and V2. Significantly father had heart attack after 55. Based on her advanced cholesterol testing; LDL goal should be less than 100. Her greatest risk at this time is the one pack per day smoking.

## 2011-05-22 NOTE — Patient Instructions (Signed)
Preventive Health Care: Exercise  30-45  minutes a day, 3-4 days a week. Walking is especially valuable in preventing Osteoporosis. Eat a low-fat diet with lots of fruits and vegetables, up to 7-9 servings per day. Consume less than 30 grams of sugar per day from foods & drinks with High Fructose Corn Syrup as # 1,2,3 or #4 on label. Health Care Power of Attorney & Living Will place you in charge of your health care  decisions. Verify these are  in place. Consider  [facility]'s smoking cessation program @ www.[place].com or 431 722 4498.   Please think about quitting smoking. Review the risks we discussed. Please call 1-800-QUIT-NOW  for free smoking cessation counseling.

## 2011-05-23 LAB — VITAMIN D 25 HYDROXY (VIT D DEFICIENCY, FRACTURES): Vit D, 25-Hydroxy: 60 ng/mL (ref 30–89)

## 2011-07-02 ENCOUNTER — Encounter: Payer: Self-pay | Admitting: Internal Medicine

## 2011-07-02 ENCOUNTER — Ambulatory Visit (INDEPENDENT_AMBULATORY_CARE_PROVIDER_SITE_OTHER): Payer: 59 | Admitting: Internal Medicine

## 2011-07-02 VITALS — BP 124/80 | HR 76 | Ht 62.0 in | Wt 88.0 lb

## 2011-07-02 DIAGNOSIS — R1319 Other dysphagia: Secondary | ICD-10-CM

## 2011-07-02 DIAGNOSIS — F17200 Nicotine dependence, unspecified, uncomplicated: Secondary | ICD-10-CM

## 2011-07-02 DIAGNOSIS — Z86010 Personal history of colonic polyps: Secondary | ICD-10-CM

## 2011-07-02 DIAGNOSIS — K219 Gastro-esophageal reflux disease without esophagitis: Secondary | ICD-10-CM

## 2011-07-02 NOTE — Assessment & Plan Note (Signed)
Recall colonoscopy was placed for April 2012. She is a bit overdue from that but medically, this is not urgent. We'll keep this on the radar screen and plan to do that in 2013, potentially even going to 2014 go we talked about doing it next year.

## 2011-07-02 NOTE — Assessment & Plan Note (Signed)
I'm not sure what the cause of this is. In the past it sounded more like a cervical esophageal problem. Now she has intact dysphagia that could be a distal esophageal stricture. There is a component of reflux symptomatology as well. We discussed different workup options include repeat barium swallow, esophageal manometry and repeat EGD with larger sized Maloney dilation. We have opted for the last.The risks and benefits as well as alternatives of endoscopic procedure(s) have been discussed and reviewed. All questions answered. The patient agrees to proceed. Further plans pending that. I anticipate that I will prescribe a PPI. Await the EGD. She will do this tomorrow.

## 2011-07-02 NOTE — Patient Instructions (Addendum)
You have been given a separate informational sheet regarding your tobacco use, the importance of quitting and local resources to help you quit. Keep trying to quit this as you are. You have been scheduled for an Endoscopy with separate instructions given.

## 2011-07-02 NOTE — Assessment & Plan Note (Signed)
Reemphasized need to quit. She is reducing, using nicotine gum, and intends to quit.

## 2011-07-02 NOTE — Progress Notes (Signed)
Subjective:    Patient ID: Jennifer Stephenson, female    DOB: 1950-07-27, 60 y.o.   MRN: [id number]  HPI This 60 year old white woman returns for problems with dysphagia. I had seen her in 2010 and thought that she had problems in the neck area and cervical esophagus, performed a 48 French dilation for suspected issues there is no not proven but it did not seem to help. She had cervical spine surgery again in March of this year and increase in dysphagia. At that time she was having some aspiration issues it sounds like early on with liquids. She is reporting now written solid food dysphagia fairly frequently. She actually has heartburn which I don't think is an issue in the past. Previously she described more like things feeling like they were stopping in her neck she went through she still has some of that but she also describes swallowing an impact dysphagia with things like bread and or rice. She saw Dr. Haroldine Laws of ENT. 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 She is also describing hoarseness but began actually before her recent C-spine surgery, having difficulty when she tries to sitting at high notes. Heartburn is most days of the week. Postprandial and at other times. She is not currently on any medication. Modified barium swallow was performed demonstrating reflux of fluids and to and fro motion in the esophagus. The oropharyngeal portion seemed relatively okay. No cough. She still smokes. Trying to quit. Caffeine about 3 cups of coffee and one soda a day. This is less than it used to be. Allergies  Allergen Reactions  . Bupropion Hcl     rash  . Paroxetine     rash  . Pentazocine Lactate     ([place])hospitalized due to mental status changes  . Prednisone     Nausea & cramps  . Sulfonamide Derivatives     nausea   Outpatient Prescriptions Prior to Visit  Medication Sig Dispense Refill  . Calcium-Vitamin D 600-125 MG-UNIT  TABS Take by mouth daily. Take 2       . cholecalciferol (VITAMIN D) 400 UNITS TABS Take by mouth daily.        Marland Kitchen diltiazem (DILACOR XR) 180 MG 24 hr capsule Take 1 capsule (180 mg total) by mouth daily.  90 capsule  3  . estrogens, conjugated, (PREMARIN) 0.625 MG tablet Take 0.625 mg by mouth daily. Take daily for 21 days then do not take for 7 days.       Marland Kitchen LORazepam (ATIVAN) 0.5 MG tablet 1 q 6-8 hrs as needed only; not maintenance med  90 tablet  1  . vitamin C (ASCORBIC ACID) 500 MG tablet Take 500 mg by mouth daily.         Past Medical History  Diagnosis Date  . Anemia     PMH of   . Hyperlipidemia   . Osteopenia   . Endometriosis   . Interstitial cystitis   . Adenomatous colon polyp 2007  . Syncope 2007    seen in ER ; Dx: dehydration  . Fracture of wrist, closed     bilaterally age 53 (  fell skating )  . HTN (hypertension)   . DDD (degenerative disc disease), cervical   . Fibrocystic breast disease    Past Surgical History  Procedure Date  . Cervical fusion     X3:.2001 C4-6, 2007 C6-7,2012C3-4 (?)  . Colonoscopy w/ polypectomy 10/28/2005  ADENOMATOUS polyp, internal hemorrhoids  . Abdominal hysterectomy 1996    & BSO FOR ENDOMETRIOSIS  . Septoplasty   . Egd with esophageal dilation 05/15/2009    gastritis   History   Social History  . Marital Status: Married    Spouse Name: N/A    Number of Children: N/A  . Years of Education: N/A   Occupational History  . DISABLED    Social History Main Topics  . Smoking status: Current Everyday Smoker -- 1.0 packs/day  . Smokeless tobacco: Never Used   Comment: Counseling sheet to quit smoking given in exam room   . Alcohol Use: No  . Drug Use: No  . Sexually Active: None   Other Topics Concern  . None   Social History Narrative    CAFFEINE 3 CUPS DAILY   Family History  Problem Relation Age of Onset  . Dementia Mother   . Ulcers Sister   . Ulcers Brother   . Diabetes Paternal Uncle   . Cancer Maternal  Grandmother     CNS  . Coronary artery disease Brother     MI after 52  . Cancer Brother     PANCREATIC   . Diabetes Brother     X 2  . Stroke Brother          Review of Systems As above. Objective:   Physical Exam General:  NAD thin Eyes:   Anicteric Pharynx: Normal Abdomen:  soft and nontender, BS+     Data Reviewed: X-ray reports modified barium swallow physician correspondence and notes          Assessment & Plan:

## 2011-07-03 ENCOUNTER — Encounter: Payer: Self-pay | Admitting: Internal Medicine

## 2011-07-03 ENCOUNTER — Ambulatory Visit (AMBULATORY_SURGERY_CENTER): Payer: 59 | Admitting: Internal Medicine

## 2011-07-03 DIAGNOSIS — K219 Gastro-esophageal reflux disease without esophagitis: Secondary | ICD-10-CM

## 2011-07-03 DIAGNOSIS — R1319 Other dysphagia: Secondary | ICD-10-CM

## 2011-07-03 MED ORDER — OMEPRAZOLE 20 MG PO CPDR: 20.0000 mg | DELAYED_RELEASE_CAPSULE | Freq: Every day | ORAL | Status: DC

## 2011-07-03 MED ORDER — SODIUM CHLORIDE 0.9 % IV SOLN: 500.0000 mL | INTRAVENOUS | Status: DC

## 2011-07-03 NOTE — Patient Instructions (Addendum)
No cause of your swallowing problems was seen. The esophagus and stomach looked normal. I did dilate the esophagus as we had discussed. Let's see how that works. I started omeprazole, an acid-blocking medication, also. It will be at your pharmacy and you can get that tomorrow. Make an appointment to see me in 2-3 months (end of February or early March). Iva Boop, MD, [facility]  Please take omeprazole 30 minutes before breakfast daily

## 2011-07-03 NOTE — Op Note (Signed)
[facility] [address] [place], [place]  [address]  ENDOSCOPY PROCEDURE REPORT  PATIENT:  Jennifer Stephenson, Jennifer Stephenson  MR#:  [id number] BIRTHDATE:  [birth year], 60 yrs. old  GENDER:  female  ENDOSCOPIST:  Iva Boop, MD, [facility]  PROCEDURE DATE:  07/03/2011 PROCEDURE:  Esophagoscopy, Elease Hashimoto Dilation of the Esophagus ASA CLASS:  Class II INDICATIONS:  1) dysphagia esophageal dilation  MEDICATIONS:   These medications were titrated to patient response per physician's verbal order, Fentanyl 50 mcg, Versed 7 mg IV TOPICAL ANESTHETIC:  cetacaine  DESCRIPTION OF PROCEDURE:   After the risks benefits and alternatives of the procedure were thoroughly explained, informed consent was obtained.  The [id number]-H180 [id number] endoscope was introduced through the mouth and advanced to the distal stomach, without limitations.  The instrument was slowly withdrawn as the mucosa was carefully examined. <<PROCEDUREIMAGES>>  The esophagus and gastroesophageal junction were completely normal in appearance.  The stomach was entered and closely examined. The antrum, angularis, and lesser curvature were well visualized, including a retroflexed view of the cardia and fundus. The stomach wall was normally distensable.     Dilation was then performed at the total esophagus  1) Dilator:  Elease Hashimoto  Size(s):  54 French Resistance:  minimal  Heme:  none  COMPLICATIONS:  None  ENDOSCOPIC IMPRESSION: 1) Normal esophagus - dilated 54 french due to dysphagia 2) Normal stomach RECOMMENDATIONS: 1)post-dilation diet today 2) start omeprazole for heartburn/reflux - prescription sent 3) See Dr. Leone Payor in 2-3 months  Iva Boop, MD, [facility]  CC:  The Patient  n. eSIGNED:   Iva Boop at 07/03/2011 04:26 PM  Mancel Bale, [phone]

## 2011-07-03 NOTE — Progress Notes (Signed)
Patient did not experience any of the following events: a burn prior to discharge; a fall within the facility; wrong site/side/patient/procedure/implant event; or a hospital transfer or hospital admission upon discharge from the facility. (G8907) Patient did not have preoperative order for IV antibiotic SSI prophylaxis. (G8918)  

## 2011-07-04 ENCOUNTER — Telehealth: Payer: Self-pay | Admitting: *Deleted

## 2011-07-04 NOTE — Telephone Encounter (Signed)

## 2011-07-15 HISTORY — PX: COLONOSCOPY: SHX174

## 2011-09-11 IMAGING — MG MM DIGITAL DIAGNOSTIC BILAT CAD
9 of 10 series · 9 of 10 positions shown · non-contrast
Comparison: 03/22/2008 and 09/14/2008.

CLINICAL DATA: Annual mammogram and follow-up left breast
calcifications.

DIGITAL DIAGNOSTIC BILATERAL MAMMOGRAM WITH CAD

[R CC]
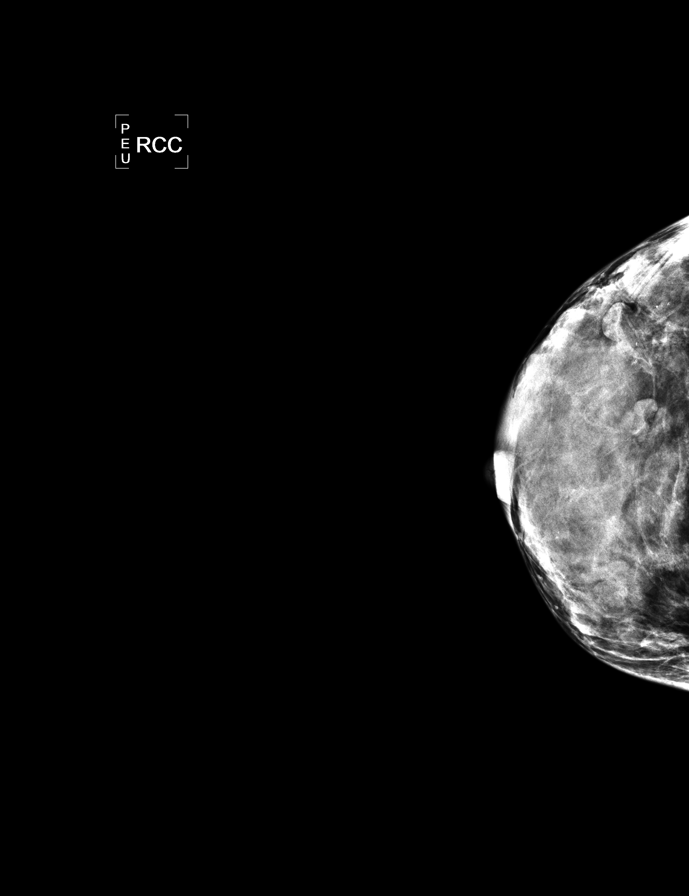

[L CC (1 of 3)]
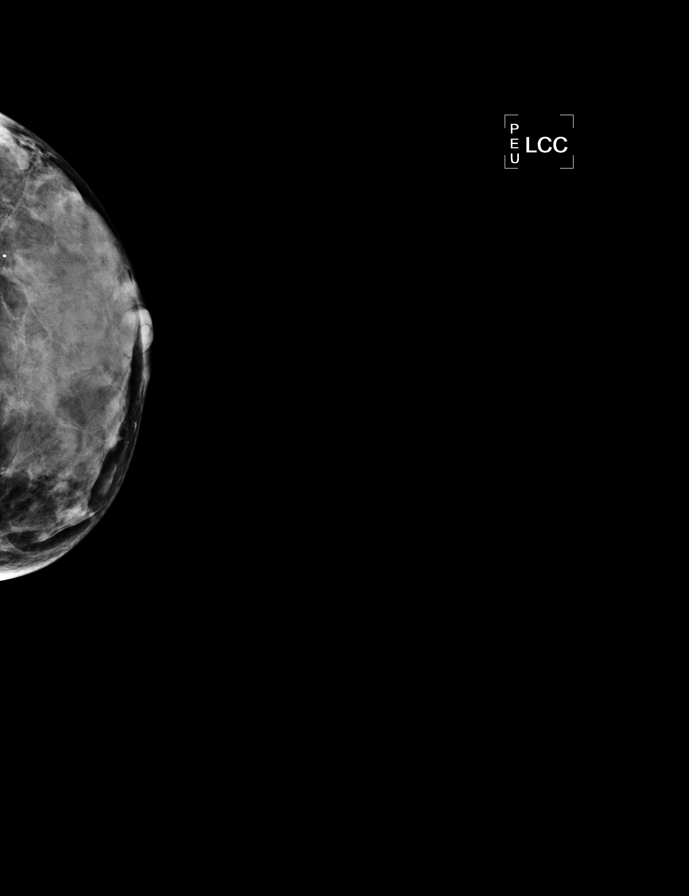

[L MLO]
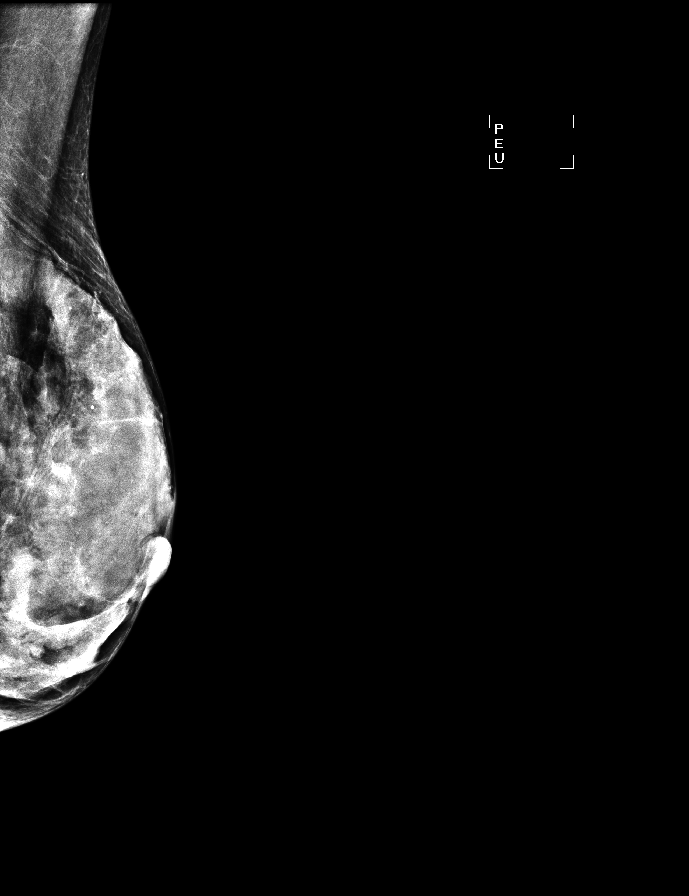

[R MLO]
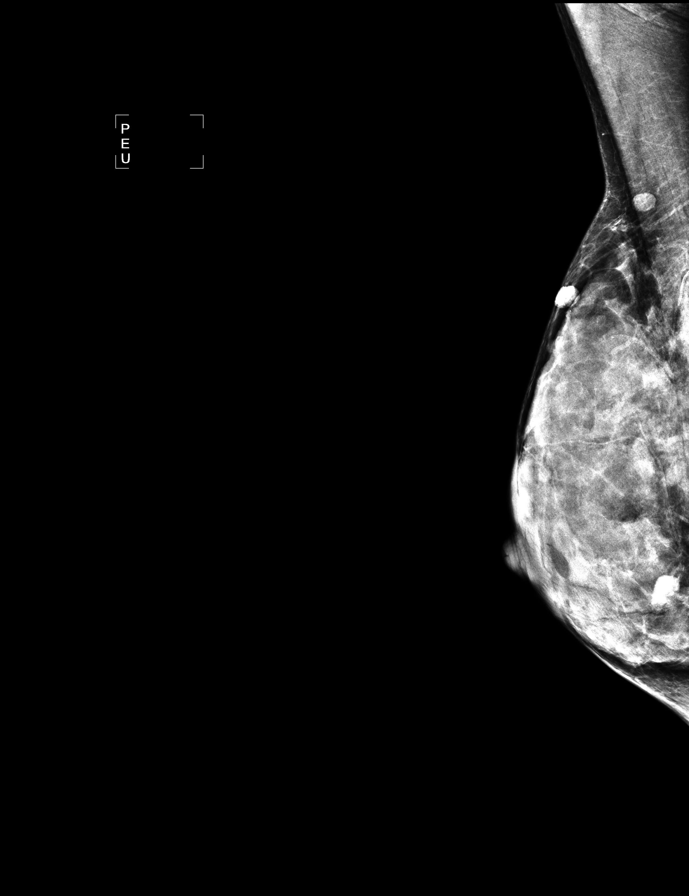

[L CC (2 of 3)]
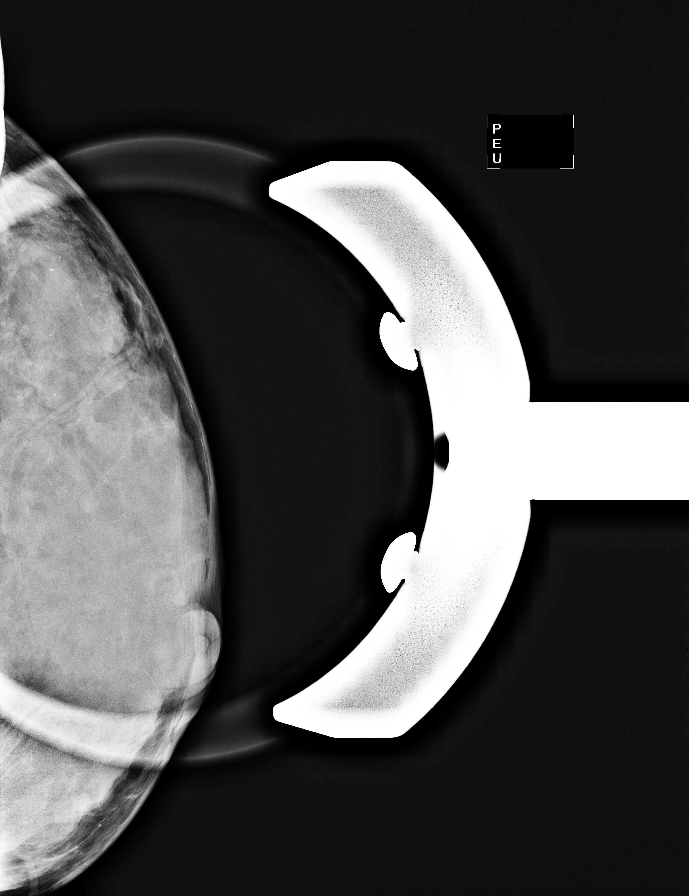

[L ML (1 of 3)]
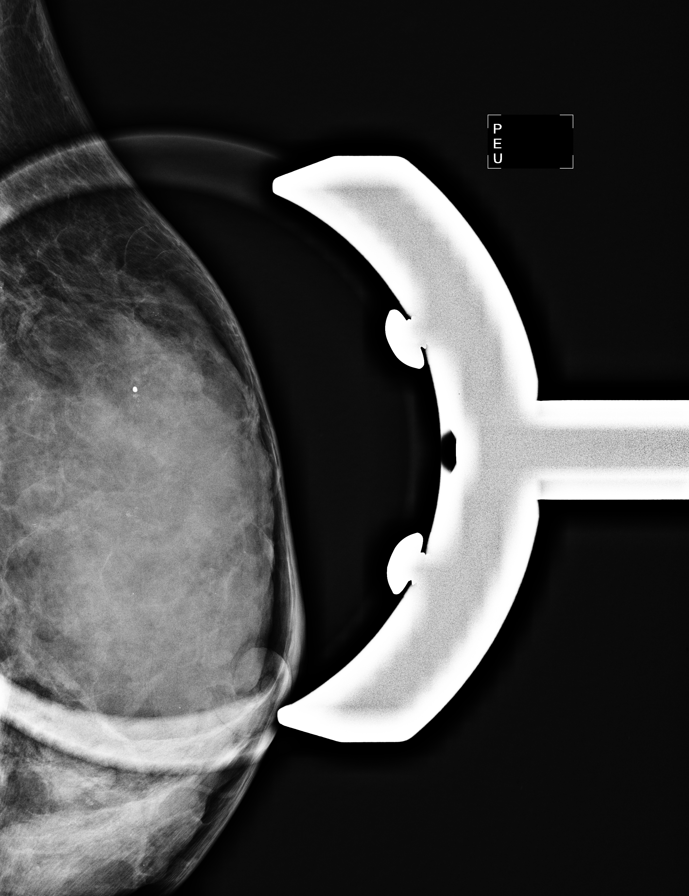

[L ML (2 of 3)]
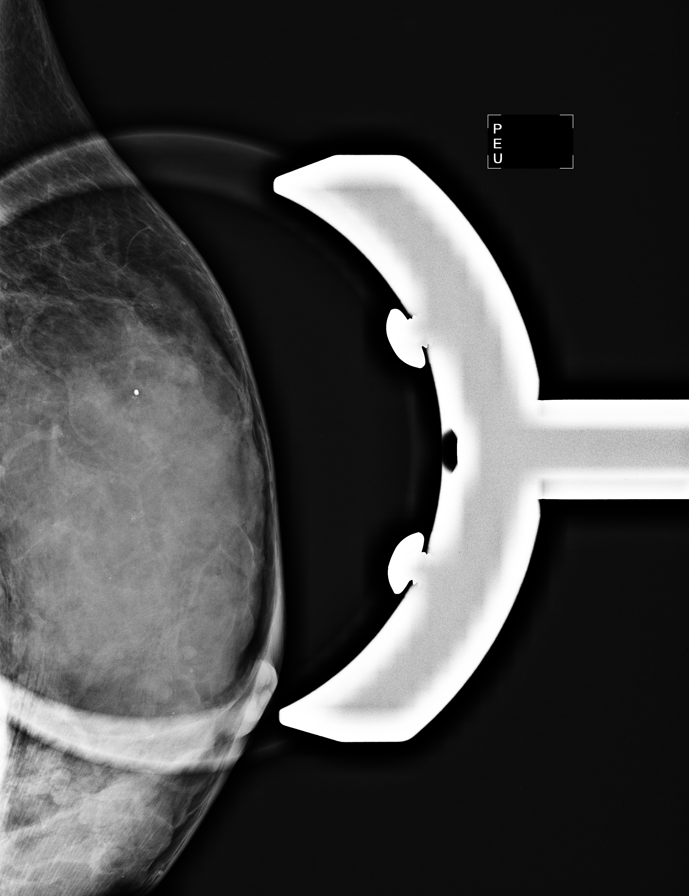

[L CC (3 of 3)]
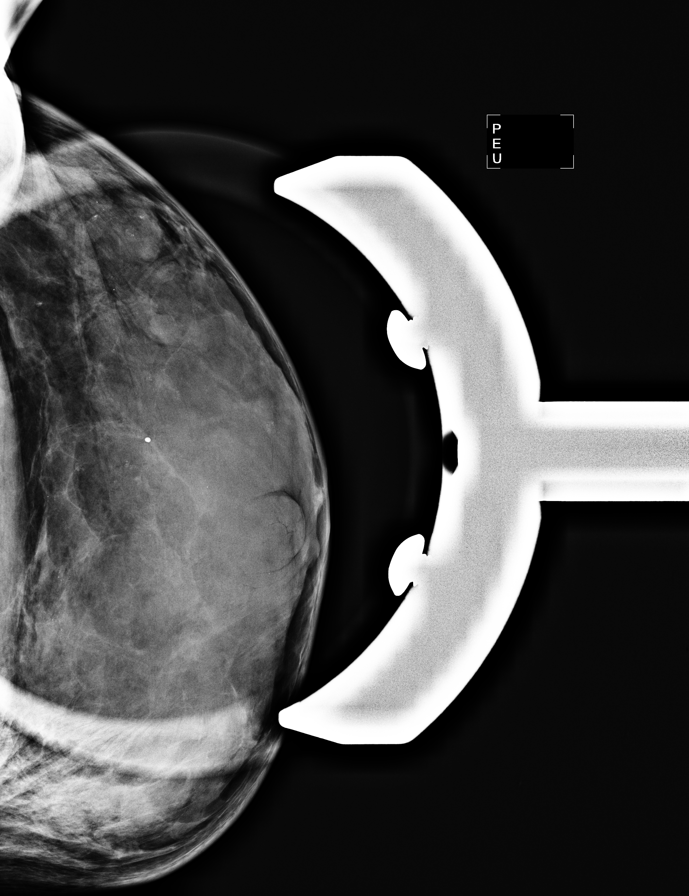

[L ML (3 of 3)]
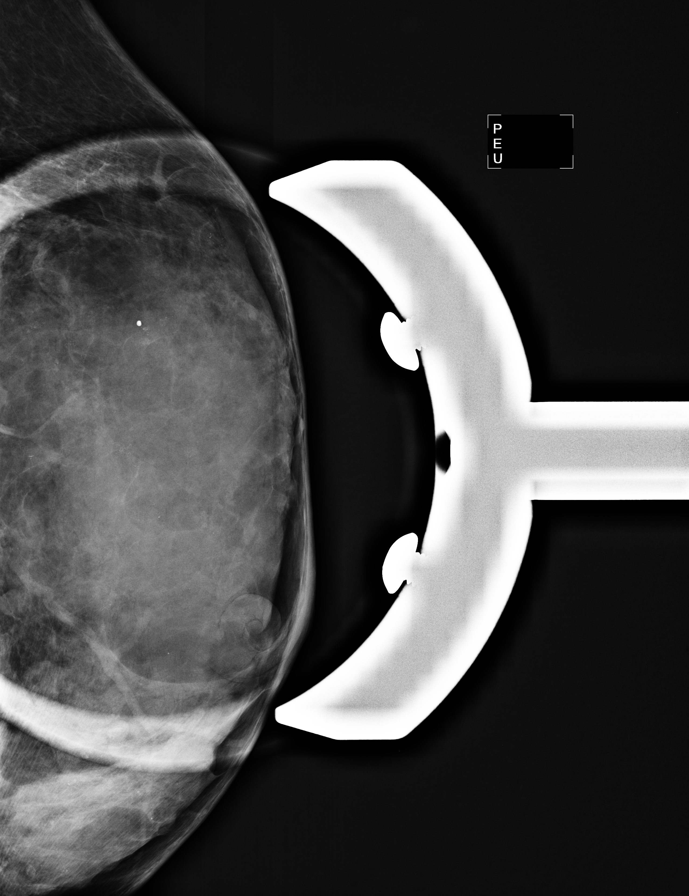

[9 of 10 positions shown; findings below may reference images not displayed]

FINDINGS: Magnification views of the left breast and CC and MLO
views of both breasts again demonstrate extremely dense breast
tissue bilaterally.
Scattered benign-appearing calcifications within the left breast
are stable.
There is no evidence of suspicious mass, new calcifications, or
distortion.
Mammographic images were processed with CAD.
IMPRESSION: Stable benign-appearing left breast calcifications.

No specific mammographic evidence of malignancy.

These findings were discussed with the patient.  She was encouraged
to continue monthly self exams and to contact her primary physician
if any changes noted.

BI-RADS CATEGORY 2:  Benign finding(s).

Recommend bilateral screening mammograms in 1 year.

## 2011-10-03 ENCOUNTER — Ambulatory Visit: Payer: 59 | Admitting: Internal Medicine

## 2011-10-24 ENCOUNTER — Encounter: Payer: Self-pay | Admitting: Internal Medicine

## 2011-10-24 ENCOUNTER — Ambulatory Visit (INDEPENDENT_AMBULATORY_CARE_PROVIDER_SITE_OTHER): Payer: 59 | Admitting: Internal Medicine

## 2011-10-24 VITALS — BP 100/60 | HR 80 | Ht 62.0 in | Wt 89.9 lb

## 2011-10-24 DIAGNOSIS — R1319 Other dysphagia: Secondary | ICD-10-CM

## 2011-10-24 DIAGNOSIS — Z1211 Encounter for screening for malignant neoplasm of colon: Secondary | ICD-10-CM

## 2011-10-24 DIAGNOSIS — Z86010 Personal history of colonic polyps: Secondary | ICD-10-CM

## 2011-10-24 DIAGNOSIS — R1310 Dysphagia, unspecified: Secondary | ICD-10-CM

## 2011-10-24 DIAGNOSIS — R1314 Dysphagia, pharyngoesophageal phase: Secondary | ICD-10-CM

## 2011-10-24 MED ORDER — PEG-KCL-NACL-NASULF-NA ASC-C 100 G PO SOLR: 1.0000 | Freq: Once | ORAL | Status: DC

## 2011-10-24 NOTE — Assessment & Plan Note (Addendum)
54 French Maloney dilation in December 2012 did not help. Previous dilation did not help either. This was a larger caliber, increasing from 48 [place]. Proceed with esophageal manometry evaluation.

## 2011-10-24 NOTE — Progress Notes (Signed)
  Subjective:    Patient ID: Jennifer Stephenson, female    DOB: January 29, 1951, 61 y.o.   MRN: [id number]  HPI The patient returns for followup dysphagia. She underwent an upper endoscopy with an empiric Maloney dilation, 62 [place], in December 2012. It has not helped her swallowing problems at all. She still has suprasternal sticking point with foods, typically solids, it does help some by drinking liquids. She does not have any signs or symptoms of aspiration. Previous modified barium swallow in 2012 did not show problems there. She has less heartburn and indigestion on a PPI.  She is asking about scheduling for followup colonoscopy because of a history of a diminutive adenoma.  Medications, allergies, past medical history, past surgical history, family history and social history are reviewed and updated in the EMR.   Review of Systems As above    Objective:   Physical Exam WDWN NAD       Assessment & Plan:   1. Dysphagia, pharyngoesophageal phase   Causes not clear. She does not really have a discrete stricture. Barium swallow in 2010 did not show obvious motility problems, it did not show any effects from her cervical spine surgery and hardware either. I've explained to her that a motility problem is possible and that esophageal manometry indicated. We will schedule that.  She does think she had some dysphagia prior to her surgery though it seems to be worse after she had cervical spine surgery.   2. Personal history of colonic polyps   Diminutive adenoma removed 12/01/2010. Will schedule screening and surveillance colonoscopy.   3. Special screening for malignant neoplasms, colon

## 2011-10-24 NOTE — Patient Instructions (Addendum)
You have been scheduled for a colonoscopy. Please follow written instructions given to you at your visit today.  Please pick up your prep kit at the pharmacy within the next 1-3 days.  You have been set up for an esophageal manometry 10/27/11 at 11:30AM, arrive 10:45AM, please follow the directions you have been given.

## 2011-10-27 ENCOUNTER — Ambulatory Visit (HOSPITAL_COMMUNITY)
Admission: RE | Admit: 2011-10-27 | Discharge: 2011-10-27 | Disposition: A | Discharge status: Home / Self Care | Payer: 59 | Source: Ambulatory Visit | Attending: Internal Medicine | Admitting: Internal Medicine

## 2011-10-27 ENCOUNTER — Encounter (HOSPITAL_COMMUNITY)
Admission: RE | Disposition: A | Discharge status: Home / Self Care | Payer: Self-pay | Source: Ambulatory Visit | Attending: Internal Medicine

## 2011-10-27 DIAGNOSIS — R1310 Dysphagia, unspecified: Secondary | ICD-10-CM | POA: Insufficient documentation

## 2011-10-27 DIAGNOSIS — R1319 Other dysphagia: Secondary | ICD-10-CM

## 2011-10-27 DIAGNOSIS — R1314 Dysphagia, pharyngoesophageal phase: Secondary | ICD-10-CM

## 2011-10-27 HISTORY — PX: ESOPHAGEAL MANOMETRY: SHX5429

## 2011-10-27 SURGERY — MANOMETRY, ESOPHAGUS
Anesthesia: Choice

## 2011-10-27 MED ORDER — LIDOCAINE VISCOUS 2 % MT SOLN
OROMUCOSAL | Status: AC
  Filled 2011-10-27: qty 15

## 2011-10-28 ENCOUNTER — Encounter (HOSPITAL_COMMUNITY): Payer: Self-pay | Admitting: Internal Medicine

## 2011-10-31 ENCOUNTER — Encounter: Payer: Self-pay | Admitting: Internal Medicine

## 2011-10-31 ENCOUNTER — Ambulatory Visit (AMBULATORY_SURGERY_CENTER): Payer: 59 | Admitting: Internal Medicine

## 2011-10-31 VITALS — BP 140/81 | HR 83 | Temp 99.4°F | Resp 20 | Ht 62.0 in | Wt 89.0 lb

## 2011-10-31 DIAGNOSIS — Z860100 Personal history of colon polyps, unspecified: Secondary | ICD-10-CM

## 2011-10-31 DIAGNOSIS — Z1211 Encounter for screening for malignant neoplasm of colon: Secondary | ICD-10-CM

## 2011-10-31 DIAGNOSIS — K648 Other hemorrhoids: Secondary | ICD-10-CM

## 2011-10-31 DIAGNOSIS — R1314 Dysphagia, pharyngoesophageal phase: Secondary | ICD-10-CM

## 2011-10-31 MED ORDER — SODIUM CHLORIDE 0.9 % IV SOLN: 500.0000 mL | INTRAVENOUS | Status: DC

## 2011-10-31 NOTE — Patient Instructions (Signed)

## 2011-10-31 NOTE — Op Note (Signed)
[facility] [address] [place], [place]  [address]  COLONOSCOPY PROCEDURE REPORT  PATIENT:  Jennifer, Stephenson  MR#:  [id number] BIRTHDATE:  [birth year], 60 yrs. old  GENDER:  female ENDOSCOPIST:  Iva Boop, MD, [facility]  PROCEDURE DATE:  10/31/2011 PROCEDURE:  Colonoscopy 19147 ASA CLASS:  Class II INDICATIONS:  surveillance and high-risk screening, history of pre-cancerous (adenomatous) colon polyps 5 mm adenoma removed at index 2007 MEDICATIONS:   These medications were titrated to patient response per physician's verbal order, Fentanyl 100 mcg IV, Versed 12 mg IV  DESCRIPTION OF PROCEDURE:   After the risks benefits and alternatives of the procedure were thoroughly explained, informed consent was obtained.  Digital rectal exam was performed and revealed no abnormalities.   The LB [id number]-H180AL [id number] endoscope was introduced through the anus and advanced to the cecum, which was identified by both the appendix and ileocecal valve, without limitations.  The quality of the prep was good, using MoviPrep. The instrument was then slowly withdrawn as the colon was fully examined. <<PROCEDUREIMAGES>>  FINDINGS:  A normal appearing cecum, ileocecal valve, and appendiceal orifice were identified. The ascending, hepatic flexure, transverse, splenic flexure, descending, sigmoid colon, and rectum appeared unremarkable.   Retroflexed views in the rectum revealed internal hemorrhoids.    The time to cecum = 5:56 minutes. The scope was then withdrawn in 9:11 minutes from the cecum and the procedure completed. COMPLICATIONS:  None ENDOSCOPIC IMPRESSION: 1) Normal colon 2) Internal hemorrhoids in the rectum 3) Personal history of diminutive adenoma removal 2007  REPEAT EXAM:  In 7 year(s) for routine screening colonoscopy.  Iva Boop, MD, [name]  CC:  Pecola Lawless, MD and The Patient  n. eSIGNED:   Iva Boop at 10/31/2011 04:05 PM  Mancel Bale,  [phone]

## 2011-10-31 NOTE — Progress Notes (Signed)
Patient did not have preoperative order for IV antibiotic SSI prophylaxis. (G8918)  Patient did not experience any of the following events: a burn prior to discharge; a fall within the facility; wrong site/side/patient/procedure/implant event; or a hospital transfer or hospital admission upon discharge from the facility. (G8907)  

## 2011-10-31 NOTE — Op Note (Signed)
ESOPHAGEAL MANOMETRY  Indications: Dysphagia  Findings:  1) Lower esophageal sphincter 40.5-43 cm from nares and normal pressure at 34.5 mm Hg. Relaxes normally.  2) Esophageal body peristalsis and contraction is normal.  3) Upper esophageal sphincter pressure and function is normal except for minimally elevated residual pressure at 9 mm Hg (top normal usually 8 mm Hg).  Impression:  Normal esophageal manometry.  Results discussed with patient - suspect post-op phenomenon after C spine surgery. She will modify diet as needed (has had negative EGD and not responded to esophageal dilation).

## 2011-11-03 ENCOUNTER — Telehealth: Payer: Self-pay | Admitting: *Deleted

## 2011-11-03 NOTE — Telephone Encounter (Signed)
  Follow up Call-  Call back number 10/31/2011 07/03/2011  Post procedure Call Back phone  # [phone] message ok  Permission to leave phone message Yes -     Patient questions:  Do you have a fever, pain , or abdominal swelling? no Pain Score  0 *  Have you tolerated food without any problems? yes  Have you been able to return to your normal activities? yes  Do you have any questions about your discharge instructions: Diet   no Medications  no Follow up visit  no  Do you have questions or concerns about your Care? no  Actions: * If pain score is 4 or above: No action needed, pain <4.  PATIENT STATING DHE HAS AN ALLERGY PROBLEM AND HAD A LOW GRADE FEVER ON ADMISSION TO [facility] ON Friday. PATIENT STATING HER TEMPERATURE UP TO 101 YESTERDAY. PATIENT STATING THIS TIME OF YEAR SHE HAS PROBLEMS WITH ALLERGIES. DENIES ABDOMINAL PAIN, BLEEDING OR ANY PROBLEMS RELATED TO COLONOSCOPY. ENCOURAGE PATIENT TO SEE PCP IF FEVER CONTINUES. PATIENT AGREEING TO SEEK MEDICAL ATTENTION IF CONTINUES.

## 2012-01-07 ENCOUNTER — Other Ambulatory Visit: Payer: Self-pay | Admitting: *Deleted

## 2012-01-07 MED ORDER — OMEPRAZOLE 20 MG PO CPDR: 20.0000 mg | DELAYED_RELEASE_CAPSULE | Freq: Every day | ORAL | Status: DC

## 2012-04-05 DIAGNOSIS — Z23 Encounter for immunization: Secondary | ICD-10-CM | POA: Diagnosis not present

## 2012-05-11 DIAGNOSIS — Z1231 Encounter for screening mammogram for malignant neoplasm of breast: Secondary | ICD-10-CM | POA: Diagnosis not present

## 2012-05-11 DIAGNOSIS — Z124 Encounter for screening for malignant neoplasm of cervix: Secondary | ICD-10-CM | POA: Diagnosis not present

## 2012-05-12 ENCOUNTER — Other Ambulatory Visit: Payer: Self-pay | Admitting: Internal Medicine

## 2012-06-01 ENCOUNTER — Ambulatory Visit (INDEPENDENT_AMBULATORY_CARE_PROVIDER_SITE_OTHER): Payer: Medicare Other | Admitting: Internal Medicine

## 2012-06-01 ENCOUNTER — Ambulatory Visit (INDEPENDENT_AMBULATORY_CARE_PROVIDER_SITE_OTHER)
Admission: RE | Admit: 2012-06-01 | Discharge: 2012-06-01 | Disposition: A | Discharge status: Home / Self Care | Payer: MEDICARE | Source: Ambulatory Visit | Attending: Internal Medicine | Admitting: Internal Medicine

## 2012-06-01 ENCOUNTER — Encounter: Payer: Self-pay | Admitting: Internal Medicine

## 2012-06-01 VITALS — BP 118/70 | HR 79 | Temp 97.5°F | Resp 12 | Ht 62.5 in | Wt 88.6 lb

## 2012-06-01 DIAGNOSIS — E785 Hyperlipidemia, unspecified: Secondary | ICD-10-CM

## 2012-06-01 DIAGNOSIS — F17200 Nicotine dependence, unspecified, uncomplicated: Secondary | ICD-10-CM

## 2012-06-01 DIAGNOSIS — Z0000 Encounter for general adult medical examination without abnormal findings: Secondary | ICD-10-CM

## 2012-06-01 DIAGNOSIS — I10 Essential (primary) hypertension: Secondary | ICD-10-CM

## 2012-06-01 DIAGNOSIS — J449 Chronic obstructive pulmonary disease, unspecified: Secondary | ICD-10-CM | POA: Diagnosis not present

## 2012-06-01 DIAGNOSIS — K635 Polyp of colon: Secondary | ICD-10-CM

## 2012-06-01 DIAGNOSIS — E041 Nontoxic single thyroid nodule: Secondary | ICD-10-CM | POA: Diagnosis not present

## 2012-06-01 DIAGNOSIS — M949 Disorder of cartilage, unspecified: Secondary | ICD-10-CM

## 2012-06-01 DIAGNOSIS — M899 Disorder of bone, unspecified: Secondary | ICD-10-CM

## 2012-06-01 DIAGNOSIS — D126 Benign neoplasm of colon, unspecified: Secondary | ICD-10-CM | POA: Diagnosis not present

## 2012-06-01 DIAGNOSIS — R9431 Abnormal electrocardiogram [ECG] [EKG]: Secondary | ICD-10-CM | POA: Insufficient documentation

## 2012-06-01 LAB — CBC WITH DIFFERENTIAL/PLATELET
Basophils Relative: 0.7 % (ref 0.0–3.0)
Eosinophils Relative: 1.5 % (ref 0.0–5.0)
HCT: 46.3 % — ABNORMAL HIGH (ref 36.0–46.0)
Hemoglobin: 15.5 g/dL — ABNORMAL HIGH (ref 12.0–15.0)
MCHC: 33.5 g/dL (ref 30.0–36.0)
MCV: 90.2 fl (ref 78.0–100.0)
Monocytes Absolute: 0.5 10*3/uL (ref 0.1–1.0)
Neutro Abs: 4.1 10*3/uL (ref 1.4–7.7)
Neutrophils Relative %: 66.3 % (ref 43.0–77.0)
RBC: 5.14 Mil/uL — ABNORMAL HIGH (ref 3.87–5.11)
WBC: 6.3 10*3/uL (ref 4.5–10.5)

## 2012-06-01 LAB — BASIC METABOLIC PANEL
CO2: 27 mEq/L (ref 19–32)
Chloride: 102 mEq/L (ref 96–112)
Creatinine, Ser: 0.9 mg/dL (ref 0.4–1.2)
Potassium: 4.4 mEq/L (ref 3.5–5.1)
Sodium: 138 mEq/L (ref 135–145)

## 2012-06-01 LAB — LIPID PANEL
Cholesterol: 236 mg/dL — ABNORMAL HIGH (ref 0–200)
Total CHOL/HDL Ratio: 4
VLDL: 13.6 mg/dL (ref 0.0–40.0)

## 2012-06-01 LAB — HEPATIC FUNCTION PANEL
ALT: 18 U/L (ref 0–35)
Albumin: 4.4 g/dL (ref 3.5–5.2)
Bilirubin, Direct: 0.1 mg/dL (ref 0.0–0.3)
Total Protein: 7.7 g/dL (ref 6.0–8.3)

## 2012-06-01 LAB — TSH: TSH: 0.96 u[IU]/mL (ref 0.35–5.50)

## 2012-06-01 MED ORDER — LORAZEPAM 0.5 MG PO TABS: ORAL_TABLET | ORAL | Status: DC

## 2012-06-01 MED ORDER — DILTIAZEM HCL ER 180 MG PO CP24: ORAL_CAPSULE | ORAL | Status: DC

## 2012-06-01 NOTE — Progress Notes (Signed)
Subjective:    Patient ID: Jennifer Stephenson, female    DOB: 06/17/1951, 61 y.o.   MRN: [id number]  HPI [name]  is here for her initial Medicare physical;acute issues are noted. Medicare Wellness Visit:  The following psychosocial & medical history were reviewed as required by Medicare.   Social history: caffeine: 1pot of coffee / day & 1 cola , alcohol:  no ,  tobacco use : 1/2 ppd  & exercise : walking 1 mpd 2-3X/week.   Home & personal  safety / fall risk: no issues, activities of daily living: no limitations, seatbelt use : yes , and smoke alarm employment : yes .  Power of Attorney/Living Will status : NO  Vision ( as recorded per Nurse) & Hearing  evaluation : Ophth exam 2012 . No hearing exam Orientation :oriented x 3 , memory & recall : good,  math testing: good,and mood & affect : normal . Depression / anxiety: some anxiety , prn Lorazepam Travel history : never out of [place] , immunization status :PNA needed , transfusion history:  no, and preventive health surveillance ( colonoscopies, BMD , etc as per protocol/ [facility]): colonoscopy up to date, Dental care:  dentures . Chart reviewed &  Updated. Active issues reviewed & addressed.         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 She's been told that her only option would be surgery. She continues to PPI.  She has exertional dyspnea which has been somewhat progressive over the last year. She has rare palpitations. Blood pressure has been ranging 120-125/68-70. She denies chest pain, claudication, edema, or paroxysmal nocturnal dyspnea.  She continues to smoke; it is now slightly more than half a pack a day     Objective:   Physical Exam Gen.: Thin but adequately nourished in appearance. Alert, appropriate and cooperative throughout exam. Head: Normocephalic without obvious abnormalities  Eyes: No corneal or conjunctival inflammation noted. Pupils equal round reactive to light and  accommodation. Fundal exam is benign without hemorrhages, exudate, papilledema. Extraocular motion intact. Vision grossly normal with lenses. Ears: External  ear exam reveals no significant lesions or deformities. Canals clear .TMs normal. Hearing is grossly normal bilaterally. Nose: External nasal exam reveals no deformity or inflammation. Nasal mucosa are pink and moist. No lesions or exudates noted.   Mouth: Oral mucosa and oropharynx reveal no lesions or exudates but erythema present. Dentures. Neck: No deformities, masses, or tenderness noted. Range of motion decreased. Thyroid asymmetric w/o definite nodule. Lungs: Normal respiratory effort; chest expands symmetrically. Lungs are clear to auscultation without rales, wheezes, or increased work of breathing. Heart: Normal rate and rhythm. Normal S1 and S2. No gallop, click, or rub. S4 w/o murmur. Abdomen: Bowel sounds normal; abdomen soft and nontender. No masses, organomegaly or hernias noted. Genitalia:Dr Henderson Cloud, [place]                                                 Musculoskeletal/extremities: No deformity or scoliosis noted of  the thoracic or lumbar spine. Slight  Clubbing w/o  cyanosis, edema, or deformity. Range of motion  normal .Tone & strength  normal.Joints normal. Nail health  good. Vascular: Carotid, radial artery, dorsalis pedis and  posterior tibial pulses are full and equal. No bruits present. Neurologic: Alert and oriented x3. Deep tendon reflexes symmetrical  and normal.          Skin: Intact without suspicious lesions or rashes. Lymph: No cervical, axillary lymphadenopathy present. Psych: Mood and affect are normal. Normally interactive                                                                                         Assessment & Plan:  #1 Medicare Wellness Exam; criteria met ; data entered #2 Problem List reviewed ; Assessment/ Recommendations made #3 COPD with progressive DOE & EKG changes Plan: see Orders

## 2012-06-01 NOTE — Patient Instructions (Addendum)
Preventive Health Care: Exercise > 30  minutes a day, 3-4 days a week. Walking is especially valuable in preventing Osteoporosis. Eat a low-fat diet with lots of fruits and vegetables, up to 7-9 servings per day.  Eye Doctor - have an eye exam @ least annually. Health Care Power of Attorney & Living Will place you in charge of your health care  decisions. Verify these are  in place. Order for x-rays entered into  the computer; these will be performed at [address] [facility] across from [facility]. No appointment is necessary. Please think about quitting smoking. Review the risks we discussed. Please call 1-800-QUIT-NOW (737-163-3590) for free smoking cessation counseling.   If you activate My Chart; the results can be released to you as soon as they populate from the lab. If you choose not to use this program; the labs have to be reviewed, copied & mailed   causing a delay in getting the results to you.  Review and correct the record as indicated. Please share record with all medical staff seen.

## 2012-06-07 ENCOUNTER — Ambulatory Visit
Admission: RE | Admit: 2012-06-07 | Discharge: 2012-06-07 | Disposition: A | Discharge status: Home / Self Care | Payer: Medicare Other | Source: Ambulatory Visit | Attending: Internal Medicine | Admitting: Internal Medicine

## 2012-06-07 ENCOUNTER — Telehealth: Payer: Self-pay

## 2012-06-07 DIAGNOSIS — E042 Nontoxic multinodular goiter: Secondary | ICD-10-CM | POA: Diagnosis not present

## 2012-06-07 DIAGNOSIS — E041 Nontoxic single thyroid nodule: Secondary | ICD-10-CM

## 2012-06-07 MED ORDER — PRAVASTATIN SODIUM 20 MG PO TABS: 20.0000 mg | ORAL_TABLET | Freq: Every day | ORAL | Status: DC

## 2012-06-07 NOTE — Telephone Encounter (Signed)
Message copied by Maurice Small on Mon Jun 07, 2012 11:25 AM ------      Message from: Pecola Lawless      Created: Sat Jun 05, 2012  6:47 AM       Please send new  Rx for Pravastatin 20 mg #90 with labs

## 2012-06-08 DIAGNOSIS — N3941 Urge incontinence: Secondary | ICD-10-CM | POA: Diagnosis not present

## 2012-08-19 ENCOUNTER — Other Ambulatory Visit (INDEPENDENT_AMBULATORY_CARE_PROVIDER_SITE_OTHER): Payer: MEDICARE

## 2012-08-19 DIAGNOSIS — T887XXA Unspecified adverse effect of drug or medicament, initial encounter: Secondary | ICD-10-CM

## 2012-08-19 DIAGNOSIS — E785 Hyperlipidemia, unspecified: Secondary | ICD-10-CM | POA: Diagnosis not present

## 2012-08-19 LAB — HEPATIC FUNCTION PANEL
AST: 18 U/L (ref 0–37)
Albumin: 4.2 g/dL (ref 3.5–5.2)
Alkaline Phosphatase: 77 U/L (ref 39–117)

## 2012-08-19 LAB — LIPID PANEL
Cholesterol: 161 mg/dL (ref 0–200)
Total CHOL/HDL Ratio: 3
Triglycerides: 83 mg/dL (ref 0.0–149.0)

## 2012-09-14 ENCOUNTER — Telehealth: Payer: Self-pay | Admitting: Internal Medicine

## 2012-09-14 MED ORDER — PRAVASTATIN SODIUM 20 MG PO TABS: 20.0000 mg | ORAL_TABLET | Freq: Every day | ORAL | Status: DC

## 2012-09-14 NOTE — Telephone Encounter (Signed)
Refill: Pravastatin 20 mg tablets. Take 1 tablet by mouth daily. Qty 90. Last fill 06-07-12

## 2012-09-14 NOTE — Telephone Encounter (Signed)
RX sent electronically 

## 2012-09-21 ENCOUNTER — Telehealth: Payer: Self-pay | Admitting: Internal Medicine

## 2012-09-21 NOTE — Telephone Encounter (Signed)
I called the pharmacy and informed them that Dr.Hopper is currently out of the office and will address medication change tomorrow Hopp please advise

## 2012-09-21 NOTE — Telephone Encounter (Signed)
[facility] calling and states MD prescribed Lorazepam 0.5mg  take 1 tab every 6-8 hrs as needed and script was written 3-10. Is not available and pharmacy only has 1mg  tabs. Wants to know if can dispense 1mg  tabs with instructions to take 1/2 tab PLEASE CALL PHARMACY/AMBER/[phone] AND ADVISE IF CAN DISPENSE 1MG  TABS

## 2012-09-22 MED ORDER — LORAZEPAM 1 MG PO TABS: ORAL_TABLET | ORAL | Status: DC

## 2012-09-22 NOTE — Telephone Encounter (Signed)
RX called in .

## 2012-09-22 NOTE — Telephone Encounter (Signed)
1 mg ; 1/2 as written  OK

## 2012-11-24 DIAGNOSIS — H6120 Impacted cerumen, unspecified ear: Secondary | ICD-10-CM | POA: Diagnosis not present

## 2012-12-01 DIAGNOSIS — H60399 Other infective otitis externa, unspecified ear: Secondary | ICD-10-CM | POA: Diagnosis not present

## 2013-02-13 ENCOUNTER — Other Ambulatory Visit: Payer: Self-pay | Admitting: Internal Medicine

## 2013-02-14 ENCOUNTER — Other Ambulatory Visit: Payer: Self-pay | Admitting: *Deleted

## 2013-02-14 ENCOUNTER — Telehealth: Payer: Self-pay | Admitting: *Deleted

## 2013-02-14 DIAGNOSIS — F411 Generalized anxiety disorder: Secondary | ICD-10-CM

## 2013-02-14 MED ORDER — LORAZEPAM 1 MG PO TABS: ORAL_TABLET | ORAL | Status: DC

## 2013-02-14 NOTE — Telephone Encounter (Signed)
#  90 ; pick up & sign contract. Please explain new policy

## 2013-02-14 NOTE — Telephone Encounter (Signed)
Pharmacy's requesting a refill for Lorazepam 1 mg.  Patient last ov 06/01/12 Last filled 09/22/12 90 ct 0 R patient does not have a controlled substance contract on file and no record of UDS. Please Advise.

## 2013-02-14 NOTE — Telephone Encounter (Signed)
Spoke with pt and advised that she would need to sign agreement and submit UDS when script is picked up

## 2013-02-14 NOTE — Telephone Encounter (Signed)
Attempted to reprint RX

## 2013-02-14 NOTE — Addendum Note (Signed)
Addended by: Maurice Small on: 02/14/2013 02:49 PM   Modules accepted: Orders

## 2013-02-16 DIAGNOSIS — Z79899 Other long term (current) drug therapy: Secondary | ICD-10-CM | POA: Diagnosis not present

## 2013-03-29 ENCOUNTER — Encounter: Payer: Self-pay | Admitting: Internal Medicine

## 2013-04-15 DIAGNOSIS — Z23 Encounter for immunization: Secondary | ICD-10-CM | POA: Diagnosis not present

## 2013-06-03 ENCOUNTER — Telehealth: Payer: Self-pay

## 2013-06-03 NOTE — Telephone Encounter (Signed)
Medication and allergies: reviewed and updated  90 day supply/mail order: na Local pharmacy: [facility]   Immunizations due:  Shingles  A/P:   No changes to FH or PSH Health Maintenance  Topic Date Due  . Zostavax  02/23/2010  . Mammogram  04/04/2011  . Influenza Vaccine  02/11/2014  . Pap Smear  05/08/2014  . Tetanus/tdap  03/07/2018  . Colonoscopy  10/31/2018  MMG Scheduled for 06/13/2013  To Discuss with Provider: Simvastatin has not taken in 30 days Legs hurting/stopped after not taking statin

## 2013-06-06 ENCOUNTER — Encounter: Payer: Self-pay | Admitting: Internal Medicine

## 2013-06-06 ENCOUNTER — Ambulatory Visit (INDEPENDENT_AMBULATORY_CARE_PROVIDER_SITE_OTHER): Payer: MEDICARE | Admitting: Internal Medicine

## 2013-06-06 VITALS — BP 135/78 | HR 79 | Temp 97.2°F | Resp 18 | Ht 62.5 in | Wt 89.0 lb

## 2013-06-06 DIAGNOSIS — E785 Hyperlipidemia, unspecified: Secondary | ICD-10-CM | POA: Diagnosis not present

## 2013-06-06 DIAGNOSIS — F17200 Nicotine dependence, unspecified, uncomplicated: Secondary | ICD-10-CM | POA: Diagnosis not present

## 2013-06-06 DIAGNOSIS — M899 Disorder of bone, unspecified: Secondary | ICD-10-CM

## 2013-06-06 DIAGNOSIS — Z0000 Encounter for general adult medical examination without abnormal findings: Secondary | ICD-10-CM | POA: Diagnosis not present

## 2013-06-06 LAB — LIPID PANEL
HDL: 46.4 mg/dL (ref >39.00)
Total CHOL/HDL Ratio: 5
VLDL: 24 mg/dL (ref 0.0–40.0)

## 2013-06-06 LAB — CBC WITH DIFFERENTIAL/PLATELET
Eosinophils Relative: 1.8 % (ref 0.0–5.0)
Lymphocytes Relative: 24.1 % (ref 12.0–46.0)
Monocytes Relative: 6.7 % (ref 3.0–12.0)
Neutrophils Relative %: 66.7 % (ref 43.0–77.0)
Platelets: 236 10*3/uL (ref 150.0–400.0)
WBC: 7 10*3/uL (ref 4.5–10.5)

## 2013-06-06 LAB — HEPATIC FUNCTION PANEL
ALT: 15 U/L (ref 0–35)
AST: 20 U/L (ref 0–37)
Bilirubin, Direct: 0 mg/dL (ref 0.0–0.3)
Total Bilirubin: 0.8 mg/dL (ref 0.3–1.2)

## 2013-06-06 LAB — BASIC METABOLIC PANEL
BUN: 13 mg/dL (ref 6–23)
Calcium: 9.9 mg/dL (ref 8.4–10.5)
Creatinine, Ser: 0.8 mg/dL (ref 0.4–1.2)
GFR: 81.62 mL/min (ref >60.00)
Potassium: 4.3 mEq/L (ref 3.5–5.1)

## 2013-06-06 LAB — LDL CHOLESTEROL, DIRECT: Direct LDL: 148.6 mg/dL

## 2013-06-06 NOTE — Progress Notes (Signed)
Pre-visit discussion using our clinic review tool. No additional management support is needed unless otherwise documented below in the visit note.  

## 2013-06-06 NOTE — Progress Notes (Signed)
  Subjective:    Patient ID: Jennifer Stephenson, female    DOB: 07-26-1950, 62 y.o.   MRN: [id number]  HPI Jennifer Stephenson is here for a physical;acute issues denied     Review of Systems  A modified heart healthy diet is followed; exercise only in Summer as walking & bike 3-4  times per week 30-45 minutes without symptoms except DOE.  Family history is positive for premature coronary disease in her brother. Advanced cholesterol testing reveals  LDL goal is less than 100 . Pravastatin D/Ced by her due to leg pain.  Low dose ASA not  Taken; PMH of gastritis Specifically denied are  chest pain, palpitations, or claudication.  Significant abdominal symptoms of dyspepsia off Omeprazole. No memory deficit not present.      Objective:   Physical Exam  Gen.: Thin but adequately nourished in appearance. Alert, appropriate and cooperative throughout exam. Head: Normocephalic without obvious abnormalities  Eyes: No corneal or conjunctival inflammation noted. Pupils equal round reactive to light and accommodation. Extraocular motion intact.  Ears: External  ear exam reveals no significant lesions or deformities. Canals clear .TMs normal.  Nose: External nasal exam reveals no deformity or inflammation. Nasal mucosa are pink and moist. No lesions or exudates noted. Septum  To R Mouth: Oral mucosa and oropharynx reveal no lesions or exudates. Dentures in good repair. Neck: No deformities, masses, or tenderness noted. Range of motion decreased. Thyroid grainy w/o definite nodules. Lungs: Normal respiratory effort; chest expands symmetrically. Lungs are clear to auscultation without rales, wheezes, or increased work of breathing. Decreased BS Heart: Normal rate and rhythm. Normal S1 and S2. No gallop, click, or rub. S4 w/o murmur. Abdomen: Bowel sounds normal; abdomen soft and nontender. No masses, organomegaly or hernias noted. Genitalia: as per Gyn                                  Musculoskeletal/extremities: No  deformity or scoliosis noted of  the thoracic or lumbar spine.   No clubbing, cyanosis, edema, or significant extremity  deformity noted. Range of motion normal .Tone & strength normal. Hand joints normal .Gzanglion R palm. Fingernail health good. Able to lie down & sit up w/o help. Negative SLR bilaterally Vascular: Carotid, radial artery, dorsalis pedis and  posterior tibial pulses are  equal. Decreased prdal pulses.No bruits present. Neurologic: Alert and oriented x3. Deep tendon reflexes symmetrical and normal.         Skin: Intact without suspicious lesions or rashes. Lymph: No cervical, axillary lymphadenopathy present. Psych: Mood and affect are normal. Normally interactive                                                                                        Assessment & Plan:  #1 comprehensive physical exam; no acute findings  Plan: see Orders  & Recommendations

## 2013-06-06 NOTE — Patient Instructions (Signed)
Your next office appointment will be determined based upon review of your pending labs . Those instructions will be transmitted to you through My Chart .  Please think about quitting smoking. Review the risks we discussed. Please call 1-800-QUIT-NOW (1-800-784-8669) for free smoking cessation counseling.  

## 2013-06-08 ENCOUNTER — Encounter: Payer: Self-pay | Admitting: *Deleted

## 2013-06-10 ENCOUNTER — Encounter: Payer: Self-pay | Admitting: *Deleted

## 2013-06-13 DIAGNOSIS — Z1289 Encounter for screening for malignant neoplasm of other sites: Secondary | ICD-10-CM | POA: Diagnosis not present

## 2013-06-13 DIAGNOSIS — Z1231 Encounter for screening mammogram for malignant neoplasm of breast: Secondary | ICD-10-CM | POA: Diagnosis not present

## 2013-06-14 ENCOUNTER — Other Ambulatory Visit: Payer: Self-pay | Admitting: *Deleted

## 2013-06-14 ENCOUNTER — Ambulatory Visit (INDEPENDENT_AMBULATORY_CARE_PROVIDER_SITE_OTHER)
Admission: RE | Admit: 2013-06-14 | Discharge: 2013-06-14 | Disposition: A | Discharge status: Home / Self Care | Payer: MEDICARE | Source: Ambulatory Visit | Attending: Internal Medicine | Admitting: Internal Medicine

## 2013-06-14 ENCOUNTER — Other Ambulatory Visit: Payer: Self-pay | Admitting: Internal Medicine

## 2013-06-14 ENCOUNTER — Telehealth: Payer: Self-pay | Admitting: *Deleted

## 2013-06-14 DIAGNOSIS — M899 Disorder of bone, unspecified: Secondary | ICD-10-CM

## 2013-06-14 DIAGNOSIS — I10 Essential (primary) hypertension: Secondary | ICD-10-CM

## 2013-06-14 DIAGNOSIS — F411 Generalized anxiety disorder: Secondary | ICD-10-CM

## 2013-06-14 MED ORDER — LORAZEPAM 1 MG PO TABS: ORAL_TABLET | ORAL | Status: DC

## 2013-06-14 MED ORDER — DILTIAZEM HCL ER 180 MG PO CP24: ORAL_CAPSULE | ORAL | Status: DC

## 2013-06-14 MED ORDER — ATORVASTATIN CALCIUM 20 MG PO TABS: 20.0000 mg | ORAL_TABLET | Freq: Every day | ORAL | Status: DC

## 2013-06-14 NOTE — Telephone Encounter (Signed)
Medications refills sent to pharmacy

## 2013-06-14 NOTE — Telephone Encounter (Signed)
Refills OK.See new Rx

## 2013-06-14 NOTE — Telephone Encounter (Signed)
Pt called and stated that she would like to begin Lipitor. Please advise of what to call in to pharmacy. She would also like a refill for Lorazepam and Diltiazem. Are these ok to refill?

## 2013-06-20 ENCOUNTER — Encounter: Payer: Self-pay | Admitting: Internal Medicine

## 2013-06-20 ENCOUNTER — Telehealth: Payer: Self-pay

## 2013-06-20 DIAGNOSIS — Z1211 Encounter for screening for malignant neoplasm of colon: Secondary | ICD-10-CM | POA: Diagnosis not present

## 2013-06-20 NOTE — Telephone Encounter (Addendum)
Message copied by Wende Mott on Mon Jun 20, 2013  3:53 PM ------      Message from: Pecola Lawless      Created: Mon Jun 20, 2013 12:57 PM       Findings : lowest T score -  2.5 @  femoral neck (hip)      Diagnosis: Osteoporosis      Recommended lifestyle interventions to prevent Osteoporosis progression include calcium 600 mg daily  & vitamin D3 supplementation to keep vit D  level @ least 40-60. The usual vitamin D3 dose is 1000 IU daily; but individual dose is determined by annual vitamin D level monitor. Also weight bearing exercise such as  walking 30-45 minutes 3-4  X per week is recommended. Smoking also speeds up osteoporotic bone loss.      Repeat BMD every 25 months.              ------spoke with patient and advised of results. Mailed copy and AVS from OV.

## 2013-08-02 ENCOUNTER — Other Ambulatory Visit: Payer: Self-pay | Admitting: Internal Medicine

## 2013-08-16 ENCOUNTER — Other Ambulatory Visit: Payer: Self-pay | Admitting: *Deleted

## 2013-08-16 DIAGNOSIS — I10 Essential (primary) hypertension: Secondary | ICD-10-CM

## 2013-08-16 MED ORDER — DILTIAZEM HCL ER 180 MG PO CP24: ORAL_CAPSULE | ORAL | Status: DC

## 2013-08-29 ENCOUNTER — Other Ambulatory Visit: Payer: Self-pay | Admitting: Internal Medicine

## 2013-09-05 ENCOUNTER — Other Ambulatory Visit: Payer: Self-pay | Admitting: Internal Medicine

## 2013-09-05 ENCOUNTER — Telehealth: Payer: Self-pay | Admitting: Internal Medicine

## 2013-09-05 DIAGNOSIS — E785 Hyperlipidemia, unspecified: Secondary | ICD-10-CM

## 2013-09-05 NOTE — Telephone Encounter (Signed)
You had issues with 2 cholesterol medications. Nutrition and exercise should be pursued with repeat fasting cholesterol panel after 4 months.  Please review Dr Nunzio Cory book Eat, [place] for best  dietary cholesterol information.  Cardiovascular exercise, this can be as simple a program as walking, is recommended 30-45 minutes 3-4 times per week. If you're not exercising you should take 6-8 weeks to build up to this level.  Please  schedule fasting  Lipid Panel after 4 months of dietary  & exercise changes to optimally assess LDL risk.

## 2013-09-05 NOTE — Telephone Encounter (Signed)
Pt called stated that atorvastatin is giving her leg pain when she takes this med. Pt stop taking it and the leg pain stop. Please advise.

## 2013-09-06 ENCOUNTER — Encounter: Payer: Self-pay | Admitting: *Deleted

## 2013-09-06 NOTE — Telephone Encounter (Signed)
Spoke with the pt and informed her of Dr. Clayborn Heron note and recommendation below.  Pt understood and agreed.  Informed the pt that I will sent her the information below in a letter.  Pt agreed.//AB/CMA

## 2013-09-07 ENCOUNTER — Other Ambulatory Visit: Payer: Self-pay | Admitting: Internal Medicine

## 2013-09-09 DIAGNOSIS — S93609A Unspecified sprain of unspecified foot, initial encounter: Secondary | ICD-10-CM | POA: Diagnosis not present

## 2013-10-25 ENCOUNTER — Ambulatory Visit (INDEPENDENT_AMBULATORY_CARE_PROVIDER_SITE_OTHER): Payer: MEDICARE | Admitting: Internal Medicine

## 2013-10-25 ENCOUNTER — Encounter: Payer: Self-pay | Admitting: Internal Medicine

## 2013-10-25 VITALS — BP 110/78 | HR 76 | Ht 61.5 in | Wt 92.1 lb

## 2013-10-25 DIAGNOSIS — R1310 Dysphagia, unspecified: Secondary | ICD-10-CM

## 2013-10-25 DIAGNOSIS — K219 Gastro-esophageal reflux disease without esophagitis: Secondary | ICD-10-CM

## 2013-10-25 DIAGNOSIS — R1319 Other dysphagia: Secondary | ICD-10-CM

## 2013-10-25 DIAGNOSIS — R1314 Dysphagia, pharyngoesophageal phase: Secondary | ICD-10-CM

## 2013-10-25 MED ORDER — OMEPRAZOLE 20 MG PO CPDR: 20.0000 mg | DELAYED_RELEASE_CAPSULE | Freq: Every day | ORAL | Status: DC

## 2013-10-25 NOTE — Assessment & Plan Note (Signed)
Stable sxs - does not prefer any other investigation - tertiary referral offered but she is not interested

## 2013-10-25 NOTE — Patient Instructions (Addendum)
You have been given a separate informational sheet regarding your tobacco use, the importance of quitting and local resources to help you quit.  We have sent the following medications to your pharmacy for you to pick up at your convenience: Omeprazole, we can refill this for up to two years for you per Dr. Carlean Purl.    You may get the omeprazole refilled thru your PCP in the future if you wish.   I appreciate the opportunity to care for you.

## 2013-10-25 NOTE — Assessment & Plan Note (Signed)
Says omeprazole helps control PM reflux so will continue It has not helped dysphagia Osteoporosis dx unlikely related as on low-dose PPI and only a few years - we discussed

## 2013-10-26 NOTE — Progress Notes (Signed)
         Subjective:    Patient ID: Jennifer Stephenson, female    DOB: 1950-11-09, 63 y.o.   MRN: [id number]  HPI The patient is here for f/u. Still has some intermittent dysphagia that we both think is related to prior C-spine surgeries. Needs a refill on omeprazole. Medications, allergies, past medical history, past surgical history, family history and social history are reviewed and updated in the EMR.  Review of Systems As above    Objective:   Physical Exam WDWN NAD   Assessment & Plan:  Esophageal dysphagia Stable sxs - does not prefer any other investigation - tertiary referral offered but she is not interested  GERD (gastroesophageal reflux disease) Says omeprazole helps control PM reflux so will continue It has not helped dysphagia Osteoporosis dx unlikely related as on low-dose PPI and only a few years - we discussed

## 2013-12-07 ENCOUNTER — Other Ambulatory Visit: Payer: Self-pay

## 2013-12-07 DIAGNOSIS — F411 Generalized anxiety disorder: Secondary | ICD-10-CM

## 2013-12-07 MED ORDER — LORAZEPAM 1 MG PO TABS: ORAL_TABLET | ORAL | Status: DC

## 2013-12-07 NOTE — Telephone Encounter (Signed)
Last ov 06/06/13 Med last filled 06/14/13 #90

## 2013-12-07 NOTE — Telephone Encounter (Signed)
OK #90, NR

## 2014-04-17 DIAGNOSIS — Z23 Encounter for immunization: Secondary | ICD-10-CM | POA: Diagnosis not present

## 2014-04-22 ENCOUNTER — Other Ambulatory Visit: Payer: Self-pay | Admitting: Internal Medicine

## 2014-04-24 ENCOUNTER — Other Ambulatory Visit: Payer: Self-pay

## 2014-04-24 DIAGNOSIS — F411 Generalized anxiety disorder: Secondary | ICD-10-CM

## 2014-04-24 MED ORDER — LORAZEPAM 1 MG PO TABS: ORAL_TABLET | ORAL | Status: DC

## 2014-06-07 ENCOUNTER — Other Ambulatory Visit (INDEPENDENT_AMBULATORY_CARE_PROVIDER_SITE_OTHER): Payer: MEDICARE

## 2014-06-07 ENCOUNTER — Other Ambulatory Visit: Payer: Self-pay | Admitting: Internal Medicine

## 2014-06-07 ENCOUNTER — Ambulatory Visit (INDEPENDENT_AMBULATORY_CARE_PROVIDER_SITE_OTHER): Payer: MEDICARE | Admitting: Internal Medicine

## 2014-06-07 ENCOUNTER — Encounter: Payer: Self-pay | Admitting: Internal Medicine

## 2014-06-07 VITALS — BP 150/82 | HR 72 | Temp 97.9°F | Resp 13 | Ht 61.5 in | Wt 89.2 lb

## 2014-06-07 DIAGNOSIS — Z0000 Encounter for general adult medical examination without abnormal findings: Secondary | ICD-10-CM | POA: Diagnosis not present

## 2014-06-07 DIAGNOSIS — F17200 Nicotine dependence, unspecified, uncomplicated: Secondary | ICD-10-CM

## 2014-06-07 DIAGNOSIS — F411 Generalized anxiety disorder: Secondary | ICD-10-CM

## 2014-06-07 DIAGNOSIS — Z0189 Encounter for other specified special examinations: Secondary | ICD-10-CM

## 2014-06-07 DIAGNOSIS — E041 Nontoxic single thyroid nodule: Secondary | ICD-10-CM | POA: Diagnosis not present

## 2014-06-07 DIAGNOSIS — I10 Essential (primary) hypertension: Secondary | ICD-10-CM | POA: Diagnosis not present

## 2014-06-07 DIAGNOSIS — Z720 Tobacco use: Secondary | ICD-10-CM

## 2014-06-07 DIAGNOSIS — E785 Hyperlipidemia, unspecified: Secondary | ICD-10-CM

## 2014-06-07 LAB — HEPATIC FUNCTION PANEL
ALBUMIN: 4.4 g/dL (ref 3.5–5.2)
ALK PHOS: 71 U/L (ref 39–117)
ALT: 15 U/L (ref 0–35)
AST: 23 U/L (ref 0–37)
BILIRUBIN DIRECT: 0.1 mg/dL (ref 0.0–0.3)
Total Bilirubin: 0.9 mg/dL (ref 0.2–1.2)
Total Protein: 7.4 g/dL (ref 6.0–8.3)

## 2014-06-07 LAB — BASIC METABOLIC PANEL
BUN: 16 mg/dL (ref 6–23)
CO2: 24 meq/L (ref 19–32)
CREATININE: 0.7 mg/dL (ref 0.4–1.2)
Calcium: 9.4 mg/dL (ref 8.4–10.5)
Chloride: 104 mEq/L (ref 96–112)
GFR: 86.6 mL/min (ref >60.00)
GLUCOSE: 74 mg/dL (ref 70–99)
Potassium: 3.9 mEq/L (ref 3.5–5.1)
Sodium: 138 mEq/L (ref 135–145)

## 2014-06-07 LAB — CBC WITH DIFFERENTIAL/PLATELET
BASOS ABS: 0 10*3/uL (ref 0.0–0.1)
Basophils Relative: 0.6 % (ref 0.0–3.0)
EOS ABS: 0.1 10*3/uL (ref 0.0–0.7)
Eosinophils Relative: 1.4 % (ref 0.0–5.0)
HCT: 46.9 % — ABNORMAL HIGH (ref 36.0–46.0)
Hemoglobin: 16 g/dL — ABNORMAL HIGH (ref 12.0–15.0)
Lymphocytes Relative: 23.3 % (ref 12.0–46.0)
Lymphs Abs: 1.9 10*3/uL (ref 0.7–4.0)
MCHC: 34.2 g/dL (ref 30.0–36.0)
MCV: 88.9 fl (ref 78.0–100.0)
MONO ABS: 0.5 10*3/uL (ref 0.1–1.0)
Monocytes Relative: 6.6 % (ref 3.0–12.0)
NEUTROS ABS: 5.5 10*3/uL (ref 1.4–7.7)
NEUTROS PCT: 68.1 % (ref 43.0–77.0)
Platelets: 195 10*3/uL (ref 150.0–400.0)
RBC: 5.28 Mil/uL — ABNORMAL HIGH (ref 3.87–5.11)
RDW: 14.1 % (ref 11.5–15.5)
WBC: 8 10*3/uL (ref 4.0–10.5)

## 2014-06-07 LAB — TSH: TSH: 0.89 u[IU]/mL (ref 0.35–4.50)

## 2014-06-07 MED ORDER — LORAZEPAM 1 MG PO TABS: ORAL_TABLET | ORAL | Status: DC

## 2014-06-07 MED ORDER — DILTIAZEM HCL ER 180 MG PO CP24: ORAL_CAPSULE | ORAL | Status: DC

## 2014-06-07 NOTE — Progress Notes (Signed)
Pre visit review using our clinic review tool, if applicable. No additional management support is needed unless otherwise documented below in the visit note. 

## 2014-06-07 NOTE — Progress Notes (Signed)
   Subjective:    Patient ID: Jennifer Stephenson, female    DOB: 10-09-1950, 63 y.o.   MRN: [id number]  HPI  She is here for a physical;acute issues denied.  She has been compliant with her medications. Blood pressure averages 120/72. She did not take her blood pressure pill today.  Her diet has improved; it is modified heart healthy, low-salt.  She's walking 3-5 miles per day  5X/week without cardiopulmonary symptoms unless she rushes. If she walks rapidly she will get exertional dyspnea.  There is a family history of premature coronary disease in a brother who had heart attack in his 33s. There is also a strong family history of stroke in 2 brothers and 2 sisters. Advanced cholesterol testing reveals that her LDL goal is less than 100, ideally less than 70.    Review of Systems   Chest pain, palpitations, tachycardia,  paroxysmal nocturnal dyspnea, claudication or edema are absent.       Objective:   Physical Exam Gen.: Thin but adequately nourished in appearance. Alert, appropriate and cooperative throughout exam. Appears younger than stated age  Head: Normocephalic without obvious abnormalities  Eyes: No corneal or conjunctival inflammation noted. Pupils equal round reactive to light and accommodation. Extraocular motion intact. Ears: External  ear exam reveals no significant lesions or deformities. Canals clear .TMs normal. Hearing is grossly normal bilaterally. Nose: External nasal exam reveals no deformity or inflammation. Nasal mucosa are pink and moist. No lesions or exudates noted.   Mouth: Oral mucosa and oropharynx reveal no lesions or exudates. Dentures in good repair. Neck: No deformities, masses, or tenderness noted. Range of motion decreased. Thyroid slightly irregular. Lungs: Normal respiratory effort; chest expands symmetrically. Lungs are clear to auscultation without rales, wheezes, or increased work of breathing.Decreased BS Heart: Normal rate and rhythm. Normal S1  and S2. No gallop, click, or rub. S4 w/o murmur. Abdomen: Bowel sounds normal; abdomen soft and nontender. No masses, organomegaly or hernias noted.Aorta palpable ; no AAA Genitalia: as per Gyn                                  Musculoskeletal/extremities: No deformity or scoliosis noted of  the thoracic or lumbar spine.  No clubbing, cyanosis, edema, or significant extremity  deformity noted. Range of motion normal .Tone & strength normal. Hand joints normal Fingernail health good. Able to lie down & sit up w/o help. Negative SLR bilaterally Vascular: Carotid, radial artery, dorsalis pedis and  posterior tibial pulses are equal. Decreased DPP.No bruits present. Neurologic: Alert and oriented x3. Deep tendon reflexes symmetrical and normal.  Gait normal    Skin: Intact without suspicious lesions or rashes. Lymph: No cervical, axillary lymphadenopathy present. Psych: Mood and affect are normal. Normally interactive                                                                                        Assessment & Plan:  #1 comprehensive physical exam; no acute findings  Plan: see Orders  & Recommendations

## 2014-06-07 NOTE — Patient Instructions (Addendum)
Your next office appointment will be determined based upon review of your pending labs. Those instructions will be transmitted to you through My Chart  Minimal Blood Pressure Goal= AVERAGE < 140/90;  Ideal is an AVERAGE < 135/85. This AVERAGE should be calculated from @ least 5-7 BP readings taken @ different times of day on different days of week. You should not respond to isolated BP readings , but rather the AVERAGE for that week .Please bring your  blood pressure cuff to office visits to verify that it is reliable.It  can also be checked against the blood pressure device at the pharmacy. Finger or wrist cuffs are not dependable; an arm cuff is. Please think about quitting smoking. Review the risks we discussed. Please call 1-800-QUIT-NOW (1-800-784-8669) for free smoking cessation counseling.  

## 2014-06-10 ENCOUNTER — Other Ambulatory Visit: Payer: Self-pay | Admitting: Internal Medicine

## 2014-06-10 DIAGNOSIS — F17200 Nicotine dependence, unspecified, uncomplicated: Secondary | ICD-10-CM

## 2014-06-10 DIAGNOSIS — R718 Other abnormality of red blood cells: Secondary | ICD-10-CM

## 2014-06-10 LAB — NMR LIPOPROFILE WITH LIPIDS
Cholesterol, Total: 206 mg/dL — ABNORMAL HIGH (ref 100–199)
HDL PARTICLE NUMBER: 36.4 umol/L (ref >=30.5)
HDL Size: 9.6 nm (ref >=9.2)
HDL-C: 66 mg/dL (ref >39)
LDL CALC: 127 mg/dL — AB (ref 0–99)
LDL PARTICLE NUMBER: 1610 nmol/L — AB (ref <1000)
LDL Size: 21.2 nm (ref >=20.8)
Large HDL-P: 9.2 umol/L (ref >=4.8)
Large VLDL-P: <0.8 nmol/L (ref <=2.7)
Small LDL Particle Number: 461 nmol/L (ref <=527)
Triglycerides: 66 mg/dL (ref 0–149)
VLDL Size: 38.8 nm (ref <=46.6)

## 2014-06-14 ENCOUNTER — Ambulatory Visit
Admission: RE | Admit: 2014-06-14 | Discharge: 2014-06-14 | Disposition: A | Discharge status: Home / Self Care | Payer: MEDICARE | Source: Ambulatory Visit | Attending: Internal Medicine | Admitting: Internal Medicine

## 2014-06-14 DIAGNOSIS — E041 Nontoxic single thyroid nodule: Secondary | ICD-10-CM

## 2014-06-14 DIAGNOSIS — E042 Nontoxic multinodular goiter: Secondary | ICD-10-CM | POA: Diagnosis not present

## 2014-06-27 ENCOUNTER — Encounter: Payer: Self-pay | Admitting: Internal Medicine

## 2014-06-28 ENCOUNTER — Other Ambulatory Visit: Payer: Self-pay | Admitting: Obstetrics and Gynecology

## 2014-06-28 DIAGNOSIS — Z1289 Encounter for screening for malignant neoplasm of other sites: Secondary | ICD-10-CM | POA: Diagnosis not present

## 2014-06-28 DIAGNOSIS — Z1231 Encounter for screening mammogram for malignant neoplasm of breast: Secondary | ICD-10-CM | POA: Diagnosis not present

## 2014-06-28 LAB — HM PAP SMEAR

## 2014-07-01 LAB — CYTOLOGY - PAP

## 2014-10-16 DIAGNOSIS — J0100 Acute maxillary sinusitis, unspecified: Secondary | ICD-10-CM | POA: Diagnosis not present

## 2014-12-06 DIAGNOSIS — J320 Chronic maxillary sinusitis: Secondary | ICD-10-CM | POA: Diagnosis not present

## 2014-12-06 DIAGNOSIS — H6501 Acute serous otitis media, right ear: Secondary | ICD-10-CM | POA: Diagnosis not present

## 2014-12-06 DIAGNOSIS — J322 Chronic ethmoidal sinusitis: Secondary | ICD-10-CM | POA: Diagnosis not present

## 2014-12-06 DIAGNOSIS — H6521 Chronic serous otitis media, right ear: Secondary | ICD-10-CM | POA: Diagnosis not present

## 2014-12-06 DIAGNOSIS — H6061 Unspecified chronic otitis externa, right ear: Secondary | ICD-10-CM | POA: Diagnosis not present

## 2014-12-06 DIAGNOSIS — J040 Acute laryngitis: Secondary | ICD-10-CM | POA: Diagnosis not present

## 2014-12-14 DIAGNOSIS — J322 Chronic ethmoidal sinusitis: Secondary | ICD-10-CM | POA: Diagnosis not present

## 2014-12-14 DIAGNOSIS — H6061 Unspecified chronic otitis externa, right ear: Secondary | ICD-10-CM | POA: Diagnosis not present

## 2014-12-14 DIAGNOSIS — H6693 Otitis media, unspecified, bilateral: Secondary | ICD-10-CM | POA: Diagnosis not present

## 2014-12-14 DIAGNOSIS — J320 Chronic maxillary sinusitis: Secondary | ICD-10-CM | POA: Diagnosis not present

## 2014-12-14 DIAGNOSIS — J040 Acute laryngitis: Secondary | ICD-10-CM | POA: Diagnosis not present

## 2015-02-20 ENCOUNTER — Other Ambulatory Visit: Payer: Self-pay

## 2015-02-20 DIAGNOSIS — I10 Essential (primary) hypertension: Secondary | ICD-10-CM

## 2015-02-20 MED ORDER — DILTIAZEM HCL ER 180 MG PO CP24: ORAL_CAPSULE | ORAL | Status: DC

## 2015-02-20 MED ORDER — OMEPRAZOLE 20 MG PO CPDR: 20.0000 mg | DELAYED_RELEASE_CAPSULE | Freq: Every day | ORAL | Status: DC

## 2015-05-11 ENCOUNTER — Encounter: Payer: Self-pay | Admitting: Internal Medicine

## 2015-05-11 ENCOUNTER — Other Ambulatory Visit (INDEPENDENT_AMBULATORY_CARE_PROVIDER_SITE_OTHER): Payer: MEDICARE

## 2015-05-11 ENCOUNTER — Ambulatory Visit (INDEPENDENT_AMBULATORY_CARE_PROVIDER_SITE_OTHER): Payer: MEDICARE | Admitting: Internal Medicine

## 2015-05-11 VITALS — BP 130/88 | HR 84 | Temp 97.8°F | Ht 61.5 in | Wt 89.5 lb

## 2015-05-11 DIAGNOSIS — E041 Nontoxic single thyroid nodule: Secondary | ICD-10-CM | POA: Diagnosis not present

## 2015-05-11 DIAGNOSIS — I10 Essential (primary) hypertension: Secondary | ICD-10-CM | POA: Diagnosis not present

## 2015-05-11 DIAGNOSIS — Z23 Encounter for immunization: Secondary | ICD-10-CM

## 2015-05-11 DIAGNOSIS — F411 Generalized anxiety disorder: Secondary | ICD-10-CM

## 2015-05-11 DIAGNOSIS — Z860100 Personal history of colon polyps, unspecified: Secondary | ICD-10-CM

## 2015-05-11 DIAGNOSIS — H8111 Benign paroxysmal vertigo, right ear: Secondary | ICD-10-CM | POA: Diagnosis not present

## 2015-05-11 DIAGNOSIS — E785 Hyperlipidemia, unspecified: Secondary | ICD-10-CM

## 2015-05-11 DIAGNOSIS — F17200 Nicotine dependence, unspecified, uncomplicated: Secondary | ICD-10-CM

## 2015-05-11 LAB — TSH: TSH: 0.68 u[IU]/mL (ref 0.35–4.50)

## 2015-05-11 LAB — HEPATIC FUNCTION PANEL
ALK PHOS: 74 U/L (ref 39–117)
ALT: 15 U/L (ref 0–35)
AST: 22 U/L (ref 0–37)
Albumin: 4.5 g/dL (ref 3.5–5.2)
BILIRUBIN DIRECT: 0.2 mg/dL (ref 0.0–0.3)
BILIRUBIN TOTAL: 0.7 mg/dL (ref 0.2–1.2)
TOTAL PROTEIN: 7.6 g/dL (ref 6.0–8.3)

## 2015-05-11 LAB — CBC WITH DIFFERENTIAL/PLATELET
BASOS ABS: 0 10*3/uL (ref 0.0–0.1)
Basophils Relative: 0.5 % (ref 0.0–3.0)
EOS ABS: 0.1 10*3/uL (ref 0.0–0.7)
Eosinophils Relative: 1.6 % (ref 0.0–5.0)
HEMATOCRIT: 46.3 % — AB (ref 36.0–46.0)
Hemoglobin: 15.7 g/dL — ABNORMAL HIGH (ref 12.0–15.0)
Lymphocytes Relative: 20 % (ref 12.0–46.0)
Lymphs Abs: 1.6 10*3/uL (ref 0.7–4.0)
MCHC: 33.9 g/dL (ref 30.0–36.0)
MCV: 88.4 fl (ref 78.0–100.0)
MONO ABS: 0.6 10*3/uL (ref 0.1–1.0)
Monocytes Relative: 8.1 % (ref 3.0–12.0)
NEUTROS ABS: 5.6 10*3/uL (ref 1.4–7.7)
NEUTROS PCT: 69.8 % (ref 43.0–77.0)
PLATELETS: 193 10*3/uL (ref 150.0–400.0)
RBC: 5.24 Mil/uL — ABNORMAL HIGH (ref 3.87–5.11)
RDW: 14.6 % (ref 11.5–15.5)
WBC: 8 10*3/uL (ref 4.0–10.5)

## 2015-05-11 LAB — LIPID PANEL
CHOL/HDL RATIO: 3
Cholesterol: 196 mg/dL (ref 0–200)
HDL: 58.5 mg/dL (ref >39.00)
LDL CALC: 121 mg/dL — AB (ref 0–99)
NONHDL: 137.6
TRIGLYCERIDES: 84 mg/dL (ref 0.0–149.0)
VLDL: 16.8 mg/dL (ref 0.0–40.0)

## 2015-05-11 LAB — BASIC METABOLIC PANEL
BUN: 12 mg/dL (ref 6–23)
CALCIUM: 10.1 mg/dL (ref 8.4–10.5)
CO2: 25 meq/L (ref 19–32)
CREATININE: 0.82 mg/dL (ref 0.40–1.20)
Chloride: 107 mEq/L (ref 96–112)
GFR: 74.31 mL/min (ref >60.00)
Glucose, Bld: 88 mg/dL (ref 70–99)
Potassium: 4.2 mEq/L (ref 3.5–5.1)
Sodium: 143 mEq/L (ref 135–145)

## 2015-05-11 MED ORDER — LORAZEPAM 1 MG PO TABS: ORAL_TABLET | ORAL | Status: DC

## 2015-05-11 MED ORDER — DILTIAZEM HCL ER 180 MG PO CP24: ORAL_CAPSULE | ORAL | Status: DC

## 2015-05-11 NOTE — Assessment & Plan Note (Signed)
No change on physical exam  TSH

## 2015-05-11 NOTE — Assessment & Plan Note (Signed)
Blood pressure goals reviewed. BMET 

## 2015-05-11 NOTE — Assessment & Plan Note (Signed)
CBC

## 2015-05-11 NOTE — Patient Instructions (Signed)
Go to Web M.D. for information on benign positional vertigo (BPV) . Physical therapy exercises can treat that if it is recurrent. Perform isometric exercise of calves  ( while seated go up on toes to count of 5 & then onto heels for 5 count). Repeat  4- 5 times prior to standing if you've been seated or supine for any significant period of time as BP drops with such positions.   Your next office appointment will be determined based upon review of your pending labs  and  xrays  Those written interpretation of the lab results and instructions will be transmitted to you by My Chart   Critical results will be called.   Followup as needed for any active or acute issue. Please report any significant change in your symptoms.

## 2015-05-11 NOTE — Progress Notes (Signed)
Pre visit review using our clinic review tool, if applicable. No additional management support is needed unless otherwise documented below in the visit note. 

## 2015-05-11 NOTE — Assessment & Plan Note (Signed)
Risk discussed 

## 2015-05-11 NOTE — Progress Notes (Signed)
   Subjective:    Patient ID: Jennifer Stephenson, female    DOB: 17-Sep-1950, 64 y.o.   MRN: [id number]  HPI The patient is here to assess status of active health conditions.  PMH, FH, & Social History reviewed & updated.No change in [name] as recorded.   She has been compliant with her medicines without adverse effects. She does avoid excess red meat, fried foods, or salt. She's been walking 3-5 miles per day at least 4 days a week without cardio pulmonary symptoms except for dyspnea if she walks at a fast clip.  She's been monitoring blood pressure at home; it averages 124/80.  Colonoscopy was completed 2013; this should be repeated in 2023. She does have some difficulty swallowing pills but this relates to her prior neurosurgical procedure @ the cervical spine. Prior endoscopy was negative. She has no other GI symptoms.   Recently over a 1.5 week period she had classic symptoms of benign positional vertigoin the right lateral decubitus position. She also had milder symptoms standing up or looking down. Dramamine was of benefit.  Review of systems is otherwise negative except for cold intolerance and nocturia once nightly  She rarely takes the lorazepam; usually less than 4 times a week.  Review of Systems  Chest pain, palpitations, tachycardia, exertional dyspnea, paroxysmal nocturnal dyspnea, claudication or edema are absent. No unexplained weight loss, abdominal pain, significant dyspepsia, dysphagia, melena, rectal bleeding, or persistently small caliber stools. Dysuria, pyuria, hematuria, frequency,  or polyuria are denied. Change in hair, skin, nails denied. No bowel changes of constipation or diarrhea. No intolerance to heat .     Objective:   Physical Exam  Pertinent or positive findings include:Hairy nevus R mandibular area.Markedly decreased cx spine ROM.R thyroid lobe prominent w/o nodules.Breath sounds decreased.  General appearance :Thin adequately nourished; in no  distress.  Eyes: No conjunctival inflammation or scleral icterus is present.  Oral exam:  Lips and gums are healthy appearing.There is no oropharyngeal erythema or exudate noted. Complete dentures.  Heart:  Normal rate and regular rhythm. S1 and S2 normal without gallop, murmur, click, rub or other extra sounds    Lungs:Chest clear to auscultation; no wheezes, rhonchi,rales ,or rubs present.No increased work of breathing.   Abdomen: bowel sounds normal, soft and non-tender without masses, organomegaly or hernias noted.  No guarding or rebound.  Vascular : all pulses equal ; no bruits present.  Skin:Warm & dry.  Intact without suspicious lesions or rashes ; no tenting or jaundice   Lymphatic: No lymphadenopathy is noted about the head, neck, axilla.   Neuro: Strength, tone & DTRs normal. Testing for benign positional vertigo was negative for nystagmus.     Assessment & Plan:  See Current Assessment & Plan in Problem List under specific Diagnosis

## 2015-05-11 NOTE — Assessment & Plan Note (Signed)
Lipids, LFTs, TSH  

## 2015-06-06 ENCOUNTER — Encounter: Payer: Self-pay | Admitting: Internal Medicine

## 2015-07-24 ENCOUNTER — Ambulatory Visit: Payer: MEDICARE | Admitting: Internal Medicine

## 2015-09-22 ENCOUNTER — Ambulatory Visit (INDEPENDENT_AMBULATORY_CARE_PROVIDER_SITE_OTHER): Payer: MEDICARE | Admitting: Physician Assistant

## 2015-09-22 ENCOUNTER — Encounter: Payer: Self-pay | Admitting: Physician Assistant

## 2015-09-22 VITALS — BP 120/80 | HR 86 | Temp 97.6°F | Resp 18 | Ht 61.5 in | Wt 92.0 lb

## 2015-09-22 DIAGNOSIS — J019 Acute sinusitis, unspecified: Secondary | ICD-10-CM | POA: Diagnosis not present

## 2015-09-22 DIAGNOSIS — B9689 Other specified bacterial agents as the cause of diseases classified elsewhere: Secondary | ICD-10-CM

## 2015-09-22 MED ORDER — AZITHROMYCIN 250 MG PO TABS: ORAL_TABLET | ORAL | Status: DC

## 2015-09-22 MED ORDER — ALBUTEROL SULFATE HFA 108 (90 BASE) MCG/ACT IN AERS: 2.0000 | INHALATION_SPRAY | Freq: Four times a day (QID) | RESPIRATORY_TRACT | Status: DC | PRN

## 2015-09-22 NOTE — Assessment & Plan Note (Signed)
Rx Azithromycin.  Increase fluids.  Rest.  Saline nasal spray.  Probiotic.  Mucinex as directed.  Humidifier in bedroom. Rx Albuterol given to use as directed PRN if chest tightness develops due to history of emphysema.  Call or return to clinic if symptoms are not improving.

## 2015-09-22 NOTE — Progress Notes (Signed)
Pre visit review using our clinic review tool, if applicable. No additional management support is needed unless otherwise documented below in the visit note. 

## 2015-09-22 NOTE — Patient Instructions (Signed)
Please take antibiotic as directed.  Increase fluid intake.  Use Saline nasal spray.  Take a daily multivitamin. Use Albuterol as directed when needed for chest tightness.  Place a humidifier in the bedroom.  Please call or return clinic if symptoms are not improving.  Sinusitis Sinusitis is redness, soreness, and swelling (inflammation) of the paranasal sinuses. Paranasal sinuses are air pockets within the bones of your face (beneath the eyes, the middle of the forehead, or above the eyes). In healthy paranasal sinuses, mucus is able to drain out, and air is able to circulate through them by way of your nose. However, when your paranasal sinuses are inflamed, mucus and air can become trapped. This can allow bacteria and other germs to grow and cause infection. Sinusitis can develop quickly and last only a short time (acute) or continue over a long period (chronic). Sinusitis that lasts for more than 12 weeks is considered chronic.  CAUSES  Causes of sinusitis include:  Allergies.  Structural abnormalities, such as displacement of the cartilage that separates your nostrils (deviated septum), which can decrease the air flow through your nose and sinuses and affect sinus drainage.  Functional abnormalities, such as when the small hairs (cilia) that line your sinuses and help remove mucus do not work properly or are not present. SYMPTOMS  Symptoms of acute and chronic sinusitis are the same. The primary symptoms are pain and pressure around the affected sinuses. Other symptoms include:  Upper toothache.  Earache.  Headache.  Bad breath.  Decreased sense of smell and taste.  A cough, which worsens when you are lying flat.  Fatigue.  Fever.  Thick drainage from your nose, which often is green and may contain pus (purulent).  Swelling and warmth over the affected sinuses. DIAGNOSIS  Your caregiver will perform a physical exam. During the exam, your caregiver may:  Look in your nose  for signs of abnormal growths in your nostrils (nasal polyps).  Tap over the affected sinus to check for signs of infection.  View the inside of your sinuses (endoscopy) with a special imaging device with a light attached (endoscope), which is inserted into your sinuses. If your caregiver suspects that you have chronic sinusitis, one or more of the following tests may be recommended:  Allergy tests.  Nasal culture A sample of mucus is taken from your nose and sent to a lab and screened for bacteria.  Nasal cytology A sample of mucus is taken from your nose and examined by your caregiver to determine if your sinusitis is related to an allergy. TREATMENT  Most cases of acute sinusitis are related to a viral infection and will resolve on their own within 10 days. Sometimes medicines are prescribed to help relieve symptoms (pain medicine, decongestants, nasal steroid sprays, or saline sprays).  However, for sinusitis related to a bacterial infection, your caregiver will prescribe antibiotic medicines. These are medicines that will help kill the bacteria causing the infection.  Rarely, sinusitis is caused by a fungal infection. In theses cases, your caregiver will prescribe antifungal medicine. For some cases of chronic sinusitis, surgery is needed. Generally, these are cases in which sinusitis recurs more than 3 times per year, despite other treatments. HOME CARE INSTRUCTIONS   Drink plenty of water. Water helps thin the mucus so your sinuses can drain more easily.  Use a humidifier.  Inhale steam 3 to 4 times a day (for example, sit in the bathroom with the shower running).  Apply a warm, moist washcloth  to your face 3 to 4 times a day, or as directed by your caregiver.  Use saline nasal sprays to help moisten and clean your sinuses.  Take over-the-counter or prescription medicines for pain, discomfort, or fever only as directed by your caregiver. SEEK IMMEDIATE MEDICAL CARE IF:  You  have increasing pain or severe headaches.  You have nausea, vomiting, or drowsiness.  You have swelling around your face.  You have vision problems.  You have a stiff neck.  You have difficulty breathing. MAKE SURE YOU:   Understand these instructions.  Will watch your condition.  Will get help right away if you are not doing well or get worse. Document Released: 06/30/2005 Document Revised: 09/22/2011 Document Reviewed: 07/15/2011 [facility] Patient Information 2014 [place], [place].

## 2015-09-22 NOTE — Progress Notes (Signed)
Patient presents to clinic today c/o 5 days of sinus pressure, sinus pain and fatigue. Now with chest congestion and dry cough. Endorses sinus headaches and facial pain.  Denies fever or chills.  Patient with history of emphysema and is currently smoking. Has taken OTC benadryl for symptom relief.  Past Medical History  Diagnosis Date  . Anemia     PMH of   . Hyperlipidemia   . Osteopenia     [place] DEXA  . Endometriosis   . Interstitial cystitis   . Adenomatous colon polyp 2007 & 2010  . Syncope 2007    seen in ER ; Dx: dehydration  . Fracture of wrist, closed     bilaterally age 65 (  fell skating )  . HTN (hypertension)   . DDD (degenerative disc disease), cervical   . Fibrocystic breast disease   . GERD (gastroesophageal reflux disease)   . Dysphagia   . Allergy     seasonal    Current Outpatient Prescriptions on File Prior to Visit  Medication Sig Dispense Refill  . Calcium-Vitamin D 600-125 MG-UNIT TABS Take by mouth daily. Take 2     . cholecalciferol (VITAMIN D) 400 UNITS TABS Take by mouth daily.      Marland Kitchen diltiazem (DILACOR XR) 180 MG 24 hr capsule TAKE ONE CAPSULE BY MOUTH DAILY 90 capsule 3  . LORazepam (ATIVAN) 1 MG tablet 1/2 by mouth every 6 -8 hours as needed, not a maintenance medication 30 tablet 1  . omeprazole (PRILOSEC) 20 MG capsule Take 1 capsule (20 mg total) by mouth daily. 90 capsule 3  . vitamin C (ASCORBIC ACID) 500 MG tablet Take 500 mg by mouth daily.      . vitamin E (VITAMIN E) 400 UNIT capsule Take 400 Units by mouth daily.     Current Facility-Administered Medications on File Prior to Visit  Medication Dose Route Frequency Provider Last Rate Last Dose  . 0.9 %  sodium chloride infusion  500 mL Intravenous Continuous Gatha Mayer, MD        Allergies  Allergen Reactions  . Bupropion Hcl     rash  . Paroxetine     rash  . Pentazocine Lactate     ([place])hospitalized due to mental status changes  . Lipitor [Atorvastatin]     09/05/13  leg pain as per phone call  . Pravastatin     Leg pain  . Prednisone     Nausea & cramps  . Sulfonamide Derivatives     nausea    Family History  Problem Relation Age of Onset  . Dementia Mother   . Ulcers Sister   . Stroke Sister      X 2; both > 65  . Ulcers Brother   . Diabetes Paternal Uncle   . Cancer Maternal Grandmother     CNS  . Heart attack Brother      in 72s  . Cancer Brother     PANCREATIC   . Diabetes Brother     X 2  . Stroke Brother     X2 , both > 26    Social History   Social History  . Marital Status: Married    Spouse Name: N/A  . Number of Children: 2  . Years of Education: N/A   Occupational History  . DISABLED    Social History Main Topics  . Smoking status: Current Every Day Smoker -- 0.50 packs/day  . Smokeless tobacco: Never Used  Comment: smoked 1967- present, up to 1 ppd; now 1/2-3/4 ppd  . Alcohol Use: No  . Drug Use: No  . Sexual Activity: Not Asked   Other Topics Concern  . None   Social History Narrative    CAFFEINE 3 CUPS DAILY    Review of Systems - See HPI.  All other ROS are negative.  BP 120/80 mmHg  Pulse 86  Temp(Src) 97.6 F (36.4 C) (Oral)  Resp 18  Ht 5' 1.5" (1.562 m)  Wt 92 lb (41.731 kg)  BMI 17.10 kg/m2  SpO2 95%  Physical Exam  Constitutional: She is oriented to person, place, and time and well-developed, well-nourished, and in no distress.  HENT:  Head: Normocephalic and atraumatic.  Right Ear: Tympanic membrane and external ear normal.  Left Ear: Tympanic membrane and external ear normal.  Nose: Mucosal edema and rhinorrhea present. Right sinus exhibits frontal sinus tenderness. Left sinus exhibits frontal sinus tenderness.  Mouth/Throat: Uvula is midline, oropharynx is clear and moist and mucous membranes are normal.  Eyes: Conjunctivae are normal.  Neck: Neck supple.  Cardiovascular: Normal rate, regular rhythm, normal heart sounds and intact distal pulses.   Pulmonary/Chest: Effort  normal and breath sounds normal. No respiratory distress. She has no wheezes. She has no rales. She exhibits no tenderness.  Lymphadenopathy:    She has no cervical adenopathy.  Neurological: She is alert and oriented to person, place, and time.  Skin: Skin is warm and dry. No rash noted.  Psychiatric: Affect normal.  Vitals reviewed.   No results found for this or any previous visit (from the past 2160 hour(s)).  Assessment/Plan: Acute bacterial sinusitis Rx Azithromycin.  Increase fluids.  Rest.  Saline nasal spray.  Probiotic.  Mucinex as directed.  Humidifier in bedroom. Rx Albuterol given to use as directed PRN if chest tightness develops due to history of emphysema.  Call or return to clinic if symptoms are not improving.

## 2015-10-04 ENCOUNTER — Ambulatory Visit (INDEPENDENT_AMBULATORY_CARE_PROVIDER_SITE_OTHER): Payer: MEDICARE | Admitting: Internal Medicine

## 2015-10-04 ENCOUNTER — Encounter: Payer: Self-pay | Admitting: Internal Medicine

## 2015-10-04 VITALS — BP 130/88 | HR 72 | Temp 98.2°F | Resp 16 | Ht 61.5 in | Wt 90.0 lb

## 2015-10-04 DIAGNOSIS — I10 Essential (primary) hypertension: Secondary | ICD-10-CM | POA: Diagnosis not present

## 2015-10-04 DIAGNOSIS — F17200 Nicotine dependence, unspecified, uncomplicated: Secondary | ICD-10-CM

## 2015-10-04 DIAGNOSIS — F411 Generalized anxiety disorder: Secondary | ICD-10-CM

## 2015-10-04 DIAGNOSIS — Z23 Encounter for immunization: Secondary | ICD-10-CM | POA: Diagnosis not present

## 2015-10-04 DIAGNOSIS — K219 Gastro-esophageal reflux disease without esophagitis: Secondary | ICD-10-CM

## 2015-10-04 DIAGNOSIS — M810 Age-related osteoporosis without current pathological fracture: Secondary | ICD-10-CM | POA: Diagnosis not present

## 2015-10-04 DIAGNOSIS — E785 Hyperlipidemia, unspecified: Secondary | ICD-10-CM

## 2015-10-04 MED ORDER — LORAZEPAM 1 MG PO TABS: ORAL_TABLET | ORAL | Status: DC

## 2015-10-04 NOTE — Assessment & Plan Note (Signed)
Walks but only in the summer and increase if possible Continue calcium and vitamin D-advised trying to take calcium citrate given that she is on omeprazole Stressed smoking cessation

## 2015-10-04 NOTE — Assessment & Plan Note (Signed)
She has tried most things, the problem is that she wants to quit, but does not want to quit Does not want to try Chantix She will continue to work on quitting

## 2015-10-04 NOTE — Progress Notes (Signed)
Subjective:    Patient ID: Jennifer Stephenson, female    DOB: 05-04-1951, 65 y.o.   MRN: [id number]  HPI She is here to establish with a new pcp.  She is here for follow up.   She is nervous about changing to a new doctor - she was with Dr Linna Darner for 40 years.   Hypertension: She is taking her medication daily. She is compliant with a low sodium diet.  She denies chest pain, edema, shortness of breath on a daily basis and relightheadedness. She is exercising regularly, but usually in the summer only.  She does monitor her blood pressure at home and is well controlled.  GERD:  She is taking her medication daily as prescribed.  She has rare GERD symptoms and feels her GERD is well controlled.  She has GERD at night once in a while.    Anxiety: She is taking her medication as needed - she takes everyday some weeks and only twice a week some weeks. She denies any side effects from the medication. She feels the medication works well.     Smoking:  She is smoking about one pack per day.  She has tried to quit several times.  She did not want to take chantix because of the possible side effects.    Medications and allergies reviewed with patient and updated if appropriate.  Patient Active Problem List   Diagnosis Date Noted  . Acute bacterial sinusitis 09/22/2015  . Elevated hematocrit 06/10/2014  . GERD (gastroesophageal reflux disease) 10/25/2013  . Abnormal EKG 06/01/2012  . Anxiety state 09/19/2008  . COLONIC POLYPS, HX OF 09/19/2008  . THYROID NODULE 03/28/2008  . Hyperlipidemia 03/07/2008  . CIGARETTE SMOKER 03/07/2008  . Esophageal dysphagia 03/07/2008  . INTERSTITIAL CYSTITIS 07/29/2007  . Essential hypertension 05/27/2007  . OSTEOPENIA 05/27/2007    Current Outpatient Prescriptions on File Prior to Visit  Medication Sig Dispense Refill  . albuterol (PROVENTIL HFA;VENTOLIN HFA) 108 (90 Base) MCG/ACT inhaler Inhale 2 puffs into the lungs every 6 (six) hours as needed for  wheezing or shortness of breath. 1 Inhaler 2  . Calcium-Vitamin D 600-125 MG-UNIT TABS Take by mouth daily. Take 2     . cholecalciferol (VITAMIN D) 400 UNITS TABS Take by mouth daily.      Marland Kitchen diltiazem (DILACOR XR) 180 MG 24 hr capsule TAKE ONE CAPSULE BY MOUTH DAILY 90 capsule 3  . LORazepam (ATIVAN) 1 MG tablet 1/2 by mouth every 6 -8 hours as needed, not a maintenance medication 30 tablet 1  . omeprazole (PRILOSEC) 20 MG capsule Take 1 capsule (20 mg total) by mouth daily. 90 capsule 3  . vitamin C (ASCORBIC ACID) 500 MG tablet Take 500 mg by mouth daily.      . vitamin E (VITAMIN E) 400 UNIT capsule Take 400 Units by mouth daily.     Current Facility-Administered Medications on File Prior to Visit  Medication Dose Route Frequency Provider Last Rate Last Dose  . 0.9 %  sodium chloride infusion  500 mL Intravenous Continuous Gatha Mayer, MD        Past Medical History  Diagnosis Date  . Anemia     PMH of   . Hyperlipidemia   . Osteopenia     [place] DEXA  . Endometriosis   . Interstitial cystitis   . Adenomatous colon polyp 2007 & 2010  . Syncope 2007    seen in ER ; Dx: dehydration  . Fracture of  wrist, closed     bilaterally age 53 (  fell skating )  . HTN (hypertension)   . DDD (degenerative disc disease), cervical   . Fibrocystic breast disease   . GERD (gastroesophageal reflux disease)   . Dysphagia   . Allergy     seasonal    Past Surgical History  Procedure Laterality Date  . Cervical fusion      X3:.2001 C4-6, 2007 C6-7,2012C3-4 (?)  . Colonoscopy w/ polypectomy  2007 & 2011    ADENOMATOUS polyp, internal hemorrhoids  . Abdominal hysterectomy  1996    & BSO FOR ENDOMETRIOSIS  . Septoplasty    . Egd with esophageal dilation  05/15/2009,07/03/11  . Esophageal manometry  10/27/2011    Procedure: ESOPHAGEAL MANOMETRY (EM);  Surgeon: Gatha Mayer, MD;  Location: [facility];  Service: Endoscopy;  Laterality: N/A;  . Tubal ligation    . Colonoscopy  2013     negative    Social History   Social History  . Marital Status: Married    Spouse Name: N/A  . Number of Children: 2  . Years of Education: N/A   Occupational History  . DISABLED    Social History Main Topics  . Smoking status: Current Every Day Smoker -- 0.50 packs/day  . Smokeless tobacco: Never Used     Comment: smoked 1967- present, up to 1 ppd; now 1/2-3/4 ppd  . Alcohol Use: No  . Drug Use: No  . Sexual Activity: Not Asked   Other Topics Concern  . None   Social History Narrative    CAFFEINE 3 CUPS DAILY    Family History  Problem Relation Age of Onset  . Dementia Mother   . Ulcers Sister   . Stroke Sister      X 2; both > 65  . Ulcers Brother   . Diabetes Paternal Uncle   . Cancer Maternal Grandmother     CNS  . Heart attack Brother      in 20s  . Cancer Brother     PANCREATIC   . Diabetes Brother     X 2  . Stroke Brother     X2 , both > 55    Review of Systems  Constitutional: Negative for fever, chills and fatigue.  Respiratory: Positive for cough and shortness of breath (sometimes with strenuous activity). Negative for wheezing.   Cardiovascular: Positive for palpitations (intermittent). Negative for chest pain and leg swelling.  Musculoskeletal: Positive for neck pain (chronic pain, s/p 3 neck surgeries).  Neurological: Positive for dizziness and headaches (dull, constant, related to neck surgery). Negative for light-headedness.       Objective:   Filed Vitals:   10/04/15 1114  BP: 130/88  Pulse: 72  Temp: 98.2 F (36.8 C)  Resp: 16   Filed Weights   10/04/15 1114  Weight: 90 lb (40.824 kg)   Body mass index is 16.73 kg/(m^2).   Physical Exam Constitutional: Appears well-developed and well-nourished. No distress.  Neck: Neck supple. No tracheal deviation present. No thyromegaly present.  No carotid bruit. No cervical adenopathy.   Cardiovascular: Normal rate, regular rhythm and normal heart sounds.   No murmur heard.  No  edema Pulmonary/Chest: Effort normal and breath sounds normal. No respiratory distress. No wheezes.        Assessment & Plan:     See Problem List for Assessment and Plan of chronic medical problems.  Follow-up in the fall for her annual physical

## 2015-10-04 NOTE — Progress Notes (Signed)
Pre visit review using our clinic review tool, if applicable. No additional management support is needed unless otherwise documented below in the visit note. 

## 2015-10-04 NOTE — Assessment & Plan Note (Signed)
BP well controlled Current regimen effective and well tolerated Continue current medications at current doses  

## 2015-10-04 NOTE — Assessment & Plan Note (Signed)
Did not tolerate statins-severe muscle aches She has revised her diet Regular exercise Check yearly

## 2015-10-04 NOTE — Assessment & Plan Note (Signed)
Controlled Continue omeprazole daily - may consider switching to zantac, but for now continue omeprazole

## 2015-10-04 NOTE — Assessment & Plan Note (Signed)
Takes Ativan-typically half of a pill as needed. Some weeks she can take it every day, others twice a week She does have high anxiety She denies side effects Continue lorazepam-she does try to keep used for minimum and understands the concerns with taking her medication regularly

## 2015-10-04 NOTE — Patient Instructions (Addendum)
Make sure you are taking calcium citrate not calcium carbonate, which is not absorbed as well with the omeprazole you are taking.    All other Health Maintenance issues reviewed.   All recommended immunizations and age-appropriate screenings are up-to-date or discussed.  prevnar vaccine administered today.   Medications reviewed and updated.  No changes recommended at this time.   Please followup annually for a physical.

## 2016-03-05 ENCOUNTER — Other Ambulatory Visit: Payer: Self-pay | Admitting: Internal Medicine

## 2016-03-05 DIAGNOSIS — F411 Generalized anxiety disorder: Secondary | ICD-10-CM

## 2016-03-05 NOTE — Telephone Encounter (Signed)
RX faxed to [facility] 

## 2016-04-27 NOTE — Progress Notes (Signed)
Subjective:    Patient ID: Jennifer Stephenson, female    DOB: 1951-06-02, 65 y.o.   MRN: [id number]  HPI She is here for an acute visit for dizziness.   Dizziness:  It started last week, 5 days ago.  She had sudden onset of feeling off balanced.  She took dramamine.  The dizziness lasted for two days.   Her head feels full, has had headaches, and her ears feel full.  The dizziness is better - it is only intermittent.  When she gets up and tries to walk she felt like she was going sideways.   She did wash out her ears and the left ear has been sore since.  She has not taken anything for the headache.  She last took dramamine two days ago.   She last had dizziness last March.  She had an infection at that time.  The antibiotics helped.  She has not had any episodes since March.     Medications and allergies reviewed with patient and updated if appropriate.  Patient Active Problem List   Diagnosis Date Noted  . Osteoporosis 10/04/2015  . Elevated hematocrit 06/10/2014  . GERD (gastroesophageal reflux disease) 10/25/2013  . Abnormal EKG 06/01/2012  . Anxiety state 09/19/2008  . COLONIC POLYPS, HX OF 09/19/2008  . THYROID NODULE 03/28/2008  . Hyperlipidemia 03/07/2008  . CIGARETTE SMOKER 03/07/2008  . Esophageal dysphagia 03/07/2008  . INTERSTITIAL CYSTITIS 07/29/2007  . Essential hypertension 05/27/2007    Current Outpatient Prescriptions on File Prior to Visit  Medication Sig Dispense Refill  . albuterol (PROVENTIL HFA;VENTOLIN HFA) 108 (90 Base) MCG/ACT inhaler Inhale 2 puffs into the lungs every 6 (six) hours as needed for wheezing or shortness of breath. 1 Inhaler 2  . Calcium-Vitamin D 600-125 MG-UNIT TABS Take by mouth daily. Take 2     . cholecalciferol (VITAMIN D) 400 UNITS TABS Take by mouth daily.      Marland Kitchen diltiazem (DILACOR XR) 180 MG 24 hr capsule TAKE ONE CAPSULE BY MOUTH DAILY 90 capsule 3  . LORazepam (ATIVAN) 1 MG tablet TAKE ONE-HALF TABLET BY MOUTH EVERY 6 TO 8  HOURS AS NEEDED, NOT A MAINTENANCE MEDICATION. 30 tablet 1  . omeprazole (PRILOSEC) 20 MG capsule Take 1 capsule (20 mg total) by mouth daily. 90 capsule 3  . vitamin C (ASCORBIC ACID) 500 MG tablet Take 500 mg by mouth daily.      . vitamin E (VITAMIN E) 400 UNIT capsule Take 400 Units by mouth daily.     Current Facility-Administered Medications on File Prior to Visit  Medication Dose Route Frequency Provider Last Rate Last Dose  . 0.9 %  sodium chloride infusion  500 mL Intravenous Continuous Gatha Mayer, MD        Past Medical History:  Diagnosis Date  . Adenomatous colon polyp 2007 & 2010  . Allergy    seasonal  . Anemia    PMH of   . DDD (degenerative disc disease), cervical   . Dysphagia   . Endometriosis   . Fibrocystic breast disease   . Fracture of wrist, closed    bilaterally age 40 (  fell skating )  . GERD (gastroesophageal reflux disease)   . HTN (hypertension)   . Hyperlipidemia   . Interstitial cystitis   . Osteopenia    [place] DEXA  . Syncope 2007   seen in ER ; Dx: dehydration    Past Surgical History:  Procedure Laterality Date  .  ABDOMINAL HYSTERECTOMY  1996   & BSO FOR ENDOMETRIOSIS  . CERVICAL FUSION     X3:.2001 C4-6, 2007 C6-7,2012C3-4 (?)  . COLONOSCOPY  2013   negative  . COLONOSCOPY W/ POLYPECTOMY  2007 & 2011   ADENOMATOUS polyp, internal hemorrhoids  . EGD WITH ESOPHAGEAL DILATION  05/15/2009,07/03/11  . ESOPHAGEAL MANOMETRY  10/27/2011   Procedure: ESOPHAGEAL MANOMETRY (EM);  Surgeon: Gatha Mayer, MD;  Location: [facility];  Service: Endoscopy;  Laterality: N/A;  . SEPTOPLASTY    . TUBAL LIGATION      Social History   Social History  . Marital status: Married    Spouse name: N/A  . Number of children: 2  . Years of education: N/A   Occupational History  . DISABLED    Social History Main Topics  . Smoking status: Current Every Day Smoker    Packs/day: 0.50  . Smokeless tobacco: Never Used     Comment: smoked 1967-  present, up to 1 ppd; now 1/2-3/4 ppd  . Alcohol use No  . Drug use: No  . Sexual activity: Not on file   Other Topics Concern  . Not on file   Social History Narrative    CAFFEINE 3 CUPS DAILY    Family History  Problem Relation Age of Onset  . Dementia Mother   . Ulcers Sister   . Stroke Sister      X 2; both > 65  . Ulcers Brother   . Diabetes Paternal Uncle   . Cancer Maternal Grandmother     CNS  . Heart attack Brother      in 28s  . Cancer Brother     PANCREATIC   . Diabetes Brother     X 2  . Stroke Brother     X2 , both > 55    Review of Systems  Constitutional: Positive for fatigue. Negative for chills and fever.  HENT: Positive for ear pain and sinus pressure. Negative for congestion, hearing loss and sore throat.   Eyes: Positive for visual disturbance (fuzzy at times).  Respiratory: Negative for cough, shortness of breath and wheezing.   Cardiovascular: Negative for chest pain and palpitations.  Gastrointestinal: Positive for nausea (with dizziness on occasional). Negative for abdominal pain and vomiting.  Musculoskeletal: Negative for myalgias.  Neurological: Positive for dizziness and headaches. Negative for weakness and numbness.       Objective:   Vitals:   04/28/16 0934  BP: 140/90  Pulse: 82  Resp: 16  Temp: 97.8 F (36.6 C)   Filed Weights   04/28/16 0934  Weight: 91 lb 12 oz (41.6 kg)   Body mass index is 16.78 kg/m.   Physical Exam GENERAL APPEARANCE: Appears stated age, well appearing, NAD EYES: conjunctiva clear, no icterus HEENT: bilateral tympanic membranes normal.  Right ear canal normal.  Left ear canal with mild irritation likely from cleaning out her ear.  oropharynx with moderate erythema, no thyromegaly, trachea midline, no cervical or supraclavicular lymphadenopathy LUNGS: Clear to auscultation without wheeze or crackles, unlabored breathing, good air entry bilaterally HEART: Normal S1,S2 without murmurs NEURO: normal  sensation and strength in all extremities, CN II-XII  EXTREMITIES: Without clubbing, cyanosis, or edema      Assessment & Plan:    See Problem List for Assessment and Plan of chronic medical problems.

## 2016-04-28 ENCOUNTER — Encounter: Payer: Self-pay | Admitting: Internal Medicine

## 2016-04-28 ENCOUNTER — Ambulatory Visit (INDEPENDENT_AMBULATORY_CARE_PROVIDER_SITE_OTHER): Payer: MEDICARE | Admitting: Internal Medicine

## 2016-04-28 VITALS — BP 140/90 | HR 82 | Temp 97.8°F | Resp 16 | Ht 62.0 in | Wt 91.8 lb

## 2016-04-28 DIAGNOSIS — Z23 Encounter for immunization: Secondary | ICD-10-CM

## 2016-04-28 DIAGNOSIS — R42 Dizziness and giddiness: Secondary | ICD-10-CM | POA: Diagnosis not present

## 2016-04-28 DIAGNOSIS — J019 Acute sinusitis, unspecified: Secondary | ICD-10-CM | POA: Diagnosis not present

## 2016-04-28 MED ORDER — MECLIZINE HCL 25 MG PO TABS: 25.0000 mg | ORAL_TABLET | Freq: Three times a day (TID) | ORAL | 0 refills | Status: DC | PRN

## 2016-04-28 MED ORDER — AZITHROMYCIN 250 MG PO TABS: ORAL_TABLET | ORAL | 0 refills | Status: DC

## 2016-04-28 NOTE — Progress Notes (Signed)
Pre visit review using our clinic review tool, if applicable. No additional management support is needed unless otherwise documented below in the visit note. 

## 2016-04-28 NOTE — Patient Instructions (Signed)
You likely have a sinus infection.  You also have dizziness.  We will start an antibiotic for the sinus infection and you can use the meclizine as needed for the dizziness.  Remember the meclizine can cause drowsiness.    Your prescription(s) have been submitted to your pharmacy. Please take as directed and contact our office if you believe you are having problem(s) with the medication(s).   Please followup if you are not feeling better

## 2016-04-28 NOTE — Assessment & Plan Note (Signed)
Probable sinus infection - concern for possible bacterial infection Start zpak Call if no improvement

## 2016-04-28 NOTE — Assessment & Plan Note (Signed)
Dizziness improving No concerning symptoms, no neurologic deficits on exam Possibly related to sinus infection Meclizine as needed

## 2016-05-15 ENCOUNTER — Encounter: Payer: Self-pay | Admitting: Internal Medicine

## 2016-05-15 ENCOUNTER — Ambulatory Visit (INDEPENDENT_AMBULATORY_CARE_PROVIDER_SITE_OTHER): Payer: MEDICARE | Admitting: Internal Medicine

## 2016-05-15 ENCOUNTER — Other Ambulatory Visit (INDEPENDENT_AMBULATORY_CARE_PROVIDER_SITE_OTHER): Payer: MEDICARE

## 2016-05-15 VITALS — BP 142/90 | HR 96 | Temp 98.6°F | Ht 62.0 in | Wt 89.0 lb

## 2016-05-15 DIAGNOSIS — E7800 Pure hypercholesterolemia, unspecified: Secondary | ICD-10-CM | POA: Diagnosis not present

## 2016-05-15 DIAGNOSIS — M810 Age-related osteoporosis without current pathological fracture: Secondary | ICD-10-CM

## 2016-05-15 DIAGNOSIS — F411 Generalized anxiety disorder: Secondary | ICD-10-CM

## 2016-05-15 DIAGNOSIS — Z0000 Encounter for general adult medical examination without abnormal findings: Secondary | ICD-10-CM | POA: Diagnosis not present

## 2016-05-15 DIAGNOSIS — I10 Essential (primary) hypertension: Secondary | ICD-10-CM

## 2016-05-15 DIAGNOSIS — K219 Gastro-esophageal reflux disease without esophagitis: Secondary | ICD-10-CM

## 2016-05-15 DIAGNOSIS — E041 Nontoxic single thyroid nodule: Secondary | ICD-10-CM

## 2016-05-15 DIAGNOSIS — F17200 Nicotine dependence, unspecified, uncomplicated: Secondary | ICD-10-CM

## 2016-05-15 LAB — COMPREHENSIVE METABOLIC PANEL
ALT: 13 U/L (ref 0–35)
AST: 17 U/L (ref 0–37)
Albumin: 4.6 g/dL (ref 3.5–5.2)
Alkaline Phosphatase: 81 U/L (ref 39–117)
BILIRUBIN TOTAL: 0.7 mg/dL (ref 0.2–1.2)
BUN: 12 mg/dL (ref 6–23)
CALCIUM: 10.2 mg/dL (ref 8.4–10.5)
CHLORIDE: 105 meq/L (ref 96–112)
CO2: 24 meq/L (ref 19–32)
Creatinine, Ser: 0.86 mg/dL (ref 0.40–1.20)
GFR: 70.12 mL/min (ref >60.00)
Glucose, Bld: 89 mg/dL (ref 70–99)
Potassium: 4.6 mEq/L (ref 3.5–5.1)
Sodium: 139 mEq/L (ref 135–145)
Total Protein: 7.7 g/dL (ref 6.0–8.3)

## 2016-05-15 LAB — CBC WITH DIFFERENTIAL/PLATELET
BASOS PCT: 0.5 % (ref 0.0–3.0)
Basophils Absolute: 0 10*3/uL (ref 0.0–0.1)
Eosinophils Absolute: 0.1 10*3/uL (ref 0.0–0.7)
Eosinophils Relative: 1.6 % (ref 0.0–5.0)
HEMATOCRIT: 47.7 % — AB (ref 36.0–46.0)
Hemoglobin: 16.4 g/dL — ABNORMAL HIGH (ref 12.0–15.0)
LYMPHS PCT: 21.8 % (ref 12.0–46.0)
Lymphs Abs: 1.9 10*3/uL (ref 0.7–4.0)
MCHC: 34.3 g/dL (ref 30.0–36.0)
MCV: 87.6 fl (ref 78.0–100.0)
MONOS PCT: 5.7 % (ref 3.0–12.0)
Monocytes Absolute: 0.5 10*3/uL (ref 0.1–1.0)
NEUTROS ABS: 6.2 10*3/uL (ref 1.4–7.7)
Neutrophils Relative %: 70.4 % (ref 43.0–77.0)
PLATELETS: 243 10*3/uL (ref 150.0–400.0)
RBC: 5.45 Mil/uL — ABNORMAL HIGH (ref 3.87–5.11)
RDW: 14.5 % (ref 11.5–15.5)
WBC: 8.9 10*3/uL (ref 4.0–10.5)

## 2016-05-15 LAB — LIPID PANEL
CHOL/HDL RATIO: 3
Cholesterol: 195 mg/dL (ref 0–200)
HDL: 58.4 mg/dL (ref >39.00)
LDL CALC: 115 mg/dL — AB (ref 0–99)
NonHDL: 136.6
TRIGLYCERIDES: 106 mg/dL (ref 0.0–149.0)
VLDL: 21.2 mg/dL (ref 0.0–40.0)

## 2016-05-15 LAB — VITAMIN D 25 HYDROXY (VIT D DEFICIENCY, FRACTURES): VITD: 30.42 ng/mL (ref 30.00–100.00)

## 2016-05-15 LAB — TSH: TSH: 1.01 u[IU]/mL (ref 0.35–4.50)

## 2016-05-15 MED ORDER — LORAZEPAM 1 MG PO TABS: ORAL_TABLET | ORAL | 1 refills | Status: DC

## 2016-05-15 MED ORDER — DILTIAZEM HCL ER 180 MG PO CP24: ORAL_CAPSULE | ORAL | 3 refills | Status: DC

## 2016-05-15 MED ORDER — RANITIDINE HCL 150 MG PO TABS: 150.0000 mg | ORAL_TABLET | Freq: Every day | ORAL | 3 refills | Status: DC

## 2016-05-15 NOTE — Progress Notes (Signed)
Pre visit review using our clinic review tool, if applicable. No additional management support is needed unless otherwise documented below in the visit note. 

## 2016-05-15 NOTE — Assessment & Plan Note (Signed)
Check lipid panel  

## 2016-05-15 NOTE — Patient Instructions (Signed)
  [name] , Thank you for taking time to come for your Medicare Wellness Visit. I appreciate your ongoing commitment to your health goals. Please review the following plan we discussed and let me know if I can assist you in the future.   These are the goals we discussed: Goals    Work on quitting smoking      This is a list of the screening recommended for you and due dates:  Health Maintenance  Topic Date Due  . Mammogram  04/04/2011  . DEXA scan (bone density measurement)  06/15/2015  . Shingles Vaccine  08/10/2017*  . Pneumonia vaccines (2 of 2 - PPSV23) 10/03/2016  . Tetanus Vaccine  03/07/2018  . Colon Cancer Screening  10/31/2018  . Flu Shot  Completed  .  Hepatitis C: One time screening is recommended by Center for Disease Control  (CDC) for  adults born from [birth year] through 1966.   Completed  *Topic was postponed. The date shown is not the original due date.     Test(s) ordered today. Your results will be released to [place] (or called to you) after review, usually within 72hours after test completion. If any changes need to be made, you will be notified at that same time.  All other Health Maintenance issues reviewed.   All recommended immunizations and age-appropriate screenings are up-to-date or discussed.  No immunizations administered today.   Medications reviewed and updated.  Changes include trying zantac 150 mg daily and stopping the omeprazole.   Your prescription(s) have been submitted to your pharmacy. Please take as directed and contact our office if you believe you are having problem(s) with the medication(s).   Please followup in 6 months

## 2016-05-15 NOTE — Assessment & Plan Note (Signed)
Stressed smoking cessation She knows she needs to quit, but does not have the motivation to quit at this time

## 2016-05-15 NOTE — Assessment & Plan Note (Signed)
D/c omeprazole - discussed potential long-term risks of PPIs Start zantac 150 mg daily

## 2016-05-15 NOTE — Assessment & Plan Note (Signed)
DEXA due-ordered Check vitamin D level Continue calcium and vitamin D Continue regular exercise

## 2016-05-15 NOTE — Assessment & Plan Note (Signed)
Anxiety not ideally controlled Taking Ativan as needed, but has been taking more than in the past Discussed starting an SSRI-she deferred Discussed seeing a therapist-she will think about it Discussed the importance of taking the Ativan only as needed so that it continues to be effective

## 2016-05-15 NOTE — Assessment & Plan Note (Signed)
Check TSH 

## 2016-05-15 NOTE — Assessment & Plan Note (Signed)
BP well controlled Current regimen effective and well tolerated Continue current medications at current doses Cmp, tsh 

## 2016-05-15 NOTE — Progress Notes (Signed)
Subjective:    Patient ID: Jennifer Stephenson, female    DOB: [birth year], 65 y.o.   MRN: [id number]  HPI Here for medicare wellness exam.   I have personally reviewed and have noted 1.The patient's medical and social history 2.Their use of alcohol, tobacco or illicit drugs 3.Their current medications and supplements 4.The patient's functional ability including ADL's, fall risks, home safety risks and                 hearing or visual impairment. 5.Diet and physical activities 6.Evidence for depression or mood disorders 7.Care team reviewed  - gyn- Dr Philis Pique   Are there smokers in your home (other than you)? No  Risk Factors Exercise:  Walks at least three miles a day Dietary issues discussed: she is a nibbler - nibbles all day long, eats a lot of chips, not many sweets, she eats veges, fruits sometimes  Cardiac risk factors: advanced age  Depression Screen  Have you felt down, depressed or hopeless? No  Have you felt little interest or pleasure in doing things?  No  Activities of Daily Living In your present state of health, do you have any difficulty performing the following activities?:  Driving? No Managing money?  No Feeding yourself? No Getting from bed to chair? No Climbing a flight of stairs? No Preparing food and eating?: No Bathing or showering? No Getting dressed: No Getting to/using the toilet? No Moving around from place to place: No In the past year have you fallen or had a near fall?: No   Are you sexually active?  No  Do you have more than one partner?  N/A  Hearing Difficulties:  Do you often ask people to speak up or repeat themselves? No Do you experience ringing or noises in your ears? occ Do you have difficulty understanding soft or whispered voices? No Vision:              Any change in vision: sometimes blurry             Up to date with eye exam:  no Memory:  Do you feel  that you have a problem with memory? No  Do you often misplace items? No  Do you feel safe at home?  Yes  Cognitive Testing  Alert, Orientated? Yes  Normal Appearance? Yes  Recall of three objects?  Yes  Can perform simple calculations? Yes  Displays appropriate judgment? Yes  Can read the correct time from a watch face? Yes   Advanced Directives have been discussed with the patient? Yes   Anxiety:  She has been taking the ativan 3-4 times a week, rarely more than once a day.  She has had increased anxiety recently and has been taking the Ativan more often. She knows she should not take it too often. Medication works well, but when she does take it more frequently does not seem to work as well. Most of her anxiety is related to caring for her mother at the end of her life and needing to put her into assisted living facility because she was not able to care for her. Her family reasons which she did, but did not step and it helped. She feels very guilty overall this. She has never spoke to a therapist.  GERD:  She is taking her medication daily as prescribed.  She denies any GERD symptoms and feels her GERD is well controlled.   Hypertension: She is taking her medication daily. She is  compliant with a low sodium diet.  She is exercising regularly.     Smoking: She is still smoking and knows she needs to quit, but cannot find enough motivation to quit.   Medications and allergies reviewed with patient and updated if appropriate.  Patient Active Problem List   Diagnosis Date Noted  . Dizziness 04/28/2016  . Osteoporosis 10/04/2015  . Acute sinus infection 09/22/2015  . Elevated hematocrit 06/10/2014  . GERD (gastroesophageal reflux disease) 10/25/2013  . Abnormal EKG 06/01/2012  . Anxiety state 09/19/2008  . COLONIC POLYPS, HX OF 09/19/2008  . THYROID NODULE 03/28/2008  . Hyperlipidemia 03/07/2008  . CIGARETTE SMOKER 03/07/2008  . Esophageal dysphagia 03/07/2008  . INTERSTITIAL  CYSTITIS 07/29/2007  . Essential hypertension 05/27/2007    Current Outpatient Prescriptions on File Prior to Visit  Medication Sig Dispense Refill  . albuterol (PROVENTIL HFA;VENTOLIN HFA) 108 (90 Base) MCG/ACT inhaler Inhale 2 puffs into the lungs every 6 (six) hours as needed for wheezing or shortness of breath. 1 Inhaler 2  . Calcium-Vitamin D 600-125 MG-UNIT TABS Take by mouth daily. Take 2     . cholecalciferol (VITAMIN D) 400 UNITS TABS Take by mouth daily.      Marland Kitchen diltiazem (DILACOR XR) 180 MG 24 hr capsule TAKE ONE CAPSULE BY MOUTH DAILY 90 capsule 3  . LORazepam (ATIVAN) 1 MG tablet TAKE ONE-HALF TABLET BY MOUTH EVERY 6 TO 8 HOURS AS NEEDED, NOT A MAINTENANCE MEDICATION. 30 tablet 1  . omeprazole (PRILOSEC) 20 MG capsule Take 1 capsule (20 mg total) by mouth daily. 90 capsule 3  . vitamin C (ASCORBIC ACID) 500 MG tablet Take 500 mg by mouth daily.      . vitamin E (VITAMIN E) 400 UNIT capsule Take 400 Units by mouth daily.    Marland Kitchen azithromycin (ZITHROMAX) 250 MG tablet Take two tabs the first day and then one tab daily for four days (Patient not taking: Reported on 05/15/2016) 6 tablet 0  . meclizine (ANTIVERT) 25 MG tablet Take 1 tablet (25 mg total) by mouth 3 (three) times daily as needed for dizziness. (Patient not taking: Reported on 05/15/2016) 30 tablet 0   Current Facility-Administered Medications on File Prior to Visit  Medication Dose Route Frequency Provider Last Rate Last Dose  . 0.9 %  sodium chloride infusion  500 mL Intravenous Continuous Gatha Mayer, MD        Past Medical History:  Diagnosis Date  . Adenomatous colon polyp 2007 & 2010  . Allergy    seasonal  . Anemia    PMH of   . DDD (degenerative disc disease), cervical   . Dysphagia   . Endometriosis   . Fibrocystic breast disease   . Fracture of wrist, closed    bilaterally age 30 (  fell skating )  . GERD (gastroesophageal reflux disease)   . HTN (hypertension)   . Hyperlipidemia   . Interstitial  cystitis   . Osteopenia    [place] DEXA  . Syncope 2007   seen in ER ; Dx: dehydration    Past Surgical History:  Procedure Laterality Date  . ABDOMINAL HYSTERECTOMY  1996   & BSO FOR ENDOMETRIOSIS  . CERVICAL FUSION     X3:.2001 C4-6, 2007 C6-7,2012C3-4 (?)  . COLONOSCOPY  2013   negative  . COLONOSCOPY W/ POLYPECTOMY  2007 & 2011   ADENOMATOUS polyp, internal hemorrhoids  . EGD WITH ESOPHAGEAL DILATION  05/15/2009,07/03/11  . ESOPHAGEAL MANOMETRY  10/27/2011   Procedure:  ESOPHAGEAL MANOMETRY (EM);  Surgeon: Gatha Mayer, MD;  Location: [facility];  Service: Endoscopy;  Laterality: N/A;  . SEPTOPLASTY    . TUBAL LIGATION      Social History   Social History  . Marital status: Married    Spouse name: N/A  . Number of children: 2  . Years of education: N/A   Occupational History  . DISABLED    Social History Main Topics  . Smoking status: Current Every Day Smoker    Packs/day: 0.50  . Smokeless tobacco: Never Used     Comment: smoked 1967- present, up to 1 ppd; now 1/2-3/4 ppd  . Alcohol use No  . Drug use: No  . Sexual activity: Not Asked   Other Topics Concern  . None   Social History Narrative    CAFFEINE 3 CUPS DAILY    Family History  Problem Relation Age of Onset  . Dementia Mother   . Ulcers Sister   . Stroke Sister      X 2; both > 65  . Ulcers Brother   . Diabetes Paternal Uncle   . Cancer Maternal Grandmother     CNS  . Heart attack Brother      in 27s  . Cancer Brother     PANCREATIC   . Diabetes Brother     X 2  . Stroke Brother     X2 , both > 55    Review of Systems  Constitutional: Negative for chills and fever.  HENT: Positive for tinnitus (occ). Negative for hearing loss.   Eyes: Positive for visual disturbance (blurry vision).  Respiratory: Positive for shortness of breath (sometimes with activity). Negative for cough and wheezing.   Cardiovascular: Positive for palpitations (occ). Negative for chest pain and leg swelling.   Gastrointestinal: Negative for abdominal pain, blood in stool, constipation, diarrhea and nausea.  Endocrine: Positive for cold intolerance (no changed).  Genitourinary: Negative for dysuria and hematuria.  Musculoskeletal: Positive for arthralgias (hip pain) and neck pain (constant from neck surgery).  Skin: Negative for color change and rash.  Neurological: Positive for headaches (from neck pain). Negative for light-headedness.  Psychiatric/Behavioral: Negative for dysphoric mood. The patient is nervous/anxious.        Objective:   Vitals:   05/15/16 1109  BP: (!) 142/90  Pulse: 96  Temp: 98.6 F (37 C)   Filed Weights   05/15/16 1109  Weight: 89 lb (40.4 kg)   Body mass index is 16.28 kg/m.   Physical Exam Constitutional: She appears well-developed and well-nourished. No distress.  HENT:  Head: Normocephalic and atraumatic.  Right Ear: External ear normal. Normal ear canal and TM Left Ear: External ear normal.  Normal ear canal and TM Mouth/Throat: Oropharynx is clear and moist.  Eyes: Conjunctivae and EOM are normal.  Neck: Neck supple. No tracheal deviation present. No thyromegaly present.  No carotid bruit  Cardiovascular: Normal rate, regular rhythm and normal heart sounds.   No murmur heard.  No edema. Pulmonary/Chest: Effort normal and breath sounds normal. No respiratory distress. She has no wheezes. She has no rales.  Breast: deferred to Gyn Abdominal: Soft. She exhibits no distension. There is tenderness in her epigastric region, which is chronic and she has had this for years. She denies any changes.   Lymphadenopathy: She has no cervical adenopathy.  Skin: Skin is warm and dry. She is not diaphoretic.  Psychiatric: She has an anxious mood and affect. Her behavior is normal.  Assessment & Plan:   Wellness Exam: Immunizations deferred shingles vaccine, others up to date Colonoscopy  Up to date  Mammogram - scheduled for January  Dexa - due -  ordered Gyn - Up to date  Eye exam  - due -- will schedule Hearing loss - none Memory concerns/difficulties  none Independent of ADLs -- fully Stressed the importance of regular exercise, which she does    Patient received copy of preventative screening tests/immunizations recommended for the next 5-10 years.  See Problem List for Assessment and Plan of chronic medical problems.

## 2016-05-16 LAB — HEPATITIS C ANTIBODY: HCV Ab: NEGATIVE

## 2016-05-17 ENCOUNTER — Encounter: Payer: Self-pay | Admitting: Internal Medicine

## 2016-05-19 ENCOUNTER — Telehealth: Payer: Self-pay | Admitting: Internal Medicine

## 2016-05-19 ENCOUNTER — Encounter: Payer: Self-pay | Admitting: Nurse Practitioner

## 2016-05-19 ENCOUNTER — Ambulatory Visit (INDEPENDENT_AMBULATORY_CARE_PROVIDER_SITE_OTHER): Payer: MEDICARE | Admitting: Nurse Practitioner

## 2016-05-19 VITALS — BP 118/82 | HR 88 | Temp 97.6°F | Ht 62.0 in | Wt 89.0 lb

## 2016-05-19 DIAGNOSIS — L239 Allergic contact dermatitis, unspecified cause: Secondary | ICD-10-CM | POA: Diagnosis not present

## 2016-05-19 DIAGNOSIS — K219 Gastro-esophageal reflux disease without esophagitis: Secondary | ICD-10-CM

## 2016-05-19 MED ORDER — HYDROXYZINE HCL 25 MG PO TABS
25.0000 mg | ORAL_TABLET | Freq: Three times a day (TID) | ORAL | 0 refills | Status: DC | PRN
Start: 2016-05-19 — End: 2018-06-02

## 2016-05-19 NOTE — Telephone Encounter (Signed)
[facility] Client [facility]  Patient Name: Jennifer Stephenson  DOB: April 17, 1951    Initial Comment Caller states, her dr. changed her reflux rx. and she has a full body rash. Verified    Nurse Assessment  Nurse: Wayne Sever, RN, Tillie Rung Date/Time (Eastern Time): 05/19/2016 9:01:26 AM  Confirm and document reason for call. If symptomatic, describe symptoms. You must click the next button to save text entered. ---Caller states she recently changed her reflux medication and woke up with a rash on Saturday and Sunday. She is taking generic for Zantac  Has the patient traveled out of the country within the last 30 days? ---Not Applicable  Does the patient have any new or worsening symptoms? ---Yes  Will a triage be completed? ---Yes  Related visit to physician within the last 2 weeks? ---No  Does the PT have any chronic conditions? (i.e. diabetes, asthma, etc.) ---No  Is this a behavioral health or substance abuse call? ---No     Guidelines    Guideline Title Affirmed Question Affirmed Notes  Rash - Widespread On Drugs Hives or itching    Final Disposition User   See Physician within 24 Hours [place], RN, Tillie Rung    Comments  Scheduled with Wilfred Lacy at 130p today   Referrals  REFERRED TO PCP OFFICE   Disagree/Comply: Comply

## 2016-05-19 NOTE — Patient Instructions (Addendum)
Do not use vistaril and benadryl simultaneously. Stop ranitidine. Resume omeprazole in 1week. Unable to use prednisone due to nausea and muscle cramps.  Drug Allergy Allergic reactions to medicines are common. Some allergic reactions are mild. A delayed type of drug allergy that occurs 1 week or more after exposure to a medicine or vaccine is called serum sickness. A life-threatening, sudden (acute) allergic reaction that involves the whole body is called anaphylaxis. CAUSES  "True" drug allergies occur when there is an allergic reaction to a medicine. This is caused by overactivity of the immune system. First, the body becomes sensitized. The immune system is triggered by your first exposure to the medicine. Following this first exposure, future exposure to the same medicine may be life-threatening. Almost any medicine can cause an allergic reaction. Common ones are:  Penicillin.  Sulfonamides (sulfa drugs).  Local anesthetics.  X-ray dyes that contain iodine. SYMPTOMS  Common symptoms of a minor allergic reaction are:  Swelling around the mouth.  An itchy red rash or hives.  Vomiting or diarrhea. Anaphylaxis can cause swelling of the mouth and throat. This makes it difficult to breathe and swallow. Severe reactions can be fatal within seconds, even after exposure to only a trace amount of the drug that causes the reaction. HOME CARE INSTRUCTIONS  If you are unsure of what caused your reaction, write down:  The names of the medicines you took.  How much medicine you took.  How you took the medicine, such as whether you took a pill, injected the medicine, or applied it to your skin.  All of the things you ate and drank.  The date and time of your reaction.  The symptoms of the reaction.  You may want to follow up with an allergy specialist after the reaction has cleared in order to be tested to confirm the allergy. It is important to confirm that your reaction is an  allergy, not just a side effect to the medicine. If you have a true allergy to a medicine, this may prevent that medicine and related medicines from being given to you when you are very ill.  If you have hives or a rash:  Take medicines as directed by your caregiver.  You may use an over-the-counter antihistamine (diphenhydramine) as needed.  Apply cold compresses to the skin or take baths in cool water. Avoid hot baths or showers.  If you are severely allergic:  Continuous observation after a severe reaction may be needed. Hospitalization is often required.  Wear a medical alert bracelet or necklace stating your allergy.  You and your family must learn how to use an anaphylaxis kit or give an epinephrine injection to temporarily treat an emergency allergic reaction. If you have had a severe reaction, always carry your epinephrine injection or anaphylaxis kit with you. This can be lifesaving if you have a severe reaction.  Do not drive or perform tasks after treatment until the medicines used to treat your reaction have worn off, or until your caregiver says it is okay.  If you have a drug allergy that was confirmed by your health care provider:  Carry information about the drug allergy with you at all times.  Always check with a pharmacist before taking any over-the-counter medicine. SEEK MEDICAL CARE IF:   You think you had an allergic reaction. Symptoms usually start within 30 minutes after exposure.  Symptoms are getting worse rather than better.  You develop new symptoms.  The symptoms that brought you to your  caregiver return. SEEK IMMEDIATE MEDICAL CARE IF:   You have swelling of the mouth, difficulty breathing, or wheezing.  You have a tight feeling in your chest or throat.  You develop hives, swelling, or itching all over your body.  You develop severe vomiting or diarrhea.  You feel faint or pass out. This is an emergency. Use your epinephrine injection or  anaphylaxis kit as you have been instructed. Call for emergency medical help. Even if you improve after the injection, you need to be examined at a hospital emergency department. MAKE SURE YOU:   Understand these instructions.  Will watch your condition.  Will get help right away if you are not doing well or get worse.   This information is not intended to replace advice given to you by your health care provider. Make sure you discuss any questions you have with your health care provider.   Document Released: 06/30/2005 Document Revised: 07/21/2014 Document Reviewed: 01/30/2015 Elsevier Interactive Patient Education [employer].

## 2016-05-19 NOTE — Progress Notes (Signed)
Subjective:  Patient ID: Jennifer Stephenson, female    DOB: 1951/03/20  Age: 65 y.o. MRN: [id number]  CC: Urticaria (Pt stated have hives-redness, itches for about 2 days)   Urticaria  This is a new problem. The current episode started in the past 7 days. The problem is unchanged. The rash is diffuse. The rash is characterized by redness and itchiness. She was exposed to a new medication (switched from omeprazole to ranitidine 05/15/16, onset of rash after 2doses.). Pertinent negatives include no anorexia, congestion, cough, diarrhea, eye pain, facial edema, fatigue, fever, joint pain, nail changes, rhinorrhea, shortness of breath, sore throat or vomiting. Past treatments include antihistamine. The treatment provided mild relief. There is no history of allergies, asthma, eczema or varicella.  Omeprazole was stopped due to osteoporosis.  Outpatient Medications Prior to Visit  Medication Sig Dispense Refill  . albuterol (PROVENTIL HFA;VENTOLIN HFA) 108 (90 Base) MCG/ACT inhaler Inhale 2 puffs into the lungs every 6 (six) hours as needed for wheezing or shortness of breath. 1 Inhaler 2  . Calcium-Vitamin D 600-125 MG-UNIT TABS Take by mouth daily. Take 2     . cholecalciferol (VITAMIN D) 400 UNITS TABS Take by mouth daily.      Marland Kitchen diltiazem (DILACOR XR) 180 MG 24 hr capsule TAKE ONE CAPSULE BY MOUTH DAILY 90 capsule 3  . LORazepam (ATIVAN) 1 MG tablet TAKE ONE-HALF TABLET BY MOUTH EVERY 6 TO 8 HOURS AS NEEDED, NOT A MAINTENANCE MEDICATION. 30 tablet 1  . vitamin C (ASCORBIC ACID) 500 MG tablet Take 500 mg by mouth daily.      . vitamin E (VITAMIN E) 400 UNIT capsule Take 400 Units by mouth daily.    . ranitidine (ZANTAC) 150 MG tablet Take 1 tablet (150 mg total) by mouth at bedtime. (Patient not taking: Reported on 05/19/2016) 90 tablet 3   No facility-administered medications prior to visit.     ROS See HPI  Objective:  BP 118/82 (BP Location: Left Arm, Patient Position: Sitting, Cuff Size:  Normal)   Pulse 88   Temp 97.6 F (36.4 C)   Ht 5\' 2"  (1.575 m)   Wt 89 lb (40.4 kg)   SpO2 98%   BMI 16.28 kg/m   BP Readings from Last 3 Encounters:  05/19/16 118/82  05/15/16 (!) 142/90  04/28/16 140/90    Wt Readings from Last 3 Encounters:  05/19/16 89 lb (40.4 kg)  05/15/16 89 lb (40.4 kg)  04/28/16 91 lb 12 oz (41.6 kg)    Physical Exam  Constitutional: She is oriented to person, place, and time. No distress.  HENT:  Nose: Nose normal.  Mouth/Throat: Uvula is midline and mucous membranes are normal. No trismus in the jaw. Posterior oropharyngeal erythema present. No oropharyngeal exudate or posterior oropharyngeal edema.  Neck: Normal range of motion. Neck supple.  Cardiovascular: Normal rate and normal heart sounds.   Pulmonary/Chest: Effort normal and breath sounds normal. No respiratory distress.  Musculoskeletal: She exhibits no edema.  Lymphadenopathy:    She has no cervical adenopathy.  Neurological: She is alert and oriented to person, place, and time.  Skin: Rash noted. Rash is urticarial. No erythema.  Diffuse urticaria with excoriation  Vitals reviewed.   Lab Results  Component Value Date   WBC 8.9 05/15/2016   HGB 16.4 (H) 05/15/2016   HCT 47.7 (H) 05/15/2016   PLT 243.0 05/15/2016   GLUCOSE 89 05/15/2016   CHOL 195 05/15/2016   TRIG 106.0 05/15/2016   HDL  58.40 05/15/2016   LDLDIRECT 148.6 06/06/2013   LDLCALC 115 (H) 05/15/2016   ALT 13 05/15/2016   AST 17 05/15/2016   NA 139 05/15/2016   K 4.6 05/15/2016   CL 105 05/15/2016   CREATININE 0.86 05/15/2016   BUN 12 05/15/2016   CO2 24 05/15/2016   TSH 1.01 05/15/2016    US Soft Tissue Head/neck  Result Date: 06/14/2014 CLINICAL DATA:  Followup of bilateral thyroid nodules. EXAM: THYROID ULTRASOUND TECHNIQUE: Ultrasound examination of the thyroid gland and adjacent soft tissues was performed. COMPARISON:  06/07/2012 FINDINGS: Right thyroid lobe Measurements: 5.9 x 1.8 x 1.8 cm. Stable  inferior nodule shows decreased echogenicity compared to the prior study but similar size of approximately 1.0 x 0.5 x 0.5 cm. Stable complex cyst measures 0.6 cm. Vague area of nodularity was separately measured in the mid right lobe but has the appearance of heterogeneous tissue and not a discrete nodule. Left thyroid lobe Measurements: 5.2 x 1.2 x 1.5 cm. Multiple small cysts are stable and benign in appearance. A 0.6 cm area of solid nodularity is stable. Isthmus Thickness: 0.2 cm.  No nodules visualized. Lymphadenopathy None visualized. IMPRESSION: Stable small bilateral thyroid nodules. Electronically Signed   By: Aletta Edouard M.D.   On: 06/14/2014 17:20    Assessment & Plan:   Jennifer Stephenson was seen today for urticaria.  Diagnoses and all orders for this visit:  Allergic dermatitis -     hydrOXYzine (ATARAX/VISTARIL) 25 MG tablet; Take 1 tablet (25 mg total) by mouth every 8 (eight) hours as needed for itching.  Gastroesophageal reflux disease, esophagitis presence not specified   I have discontinued Jennifer Stephenson's ranitidine. I am also having her start on hydrOXYzine. Additionally, I am having her maintain her Calcium-Vitamin D, vitamin C, cholecalciferol, vitamin E, albuterol, diltiazem, and LORazepam.  Meds ordered this encounter  Medications  . hydrOXYzine (ATARAX/VISTARIL) 25 MG tablet    Sig: Take 1 tablet (25 mg total) by mouth every 8 (eight) hours as needed for itching.    Dispense:  30 tablet    Refill:  0    Order Specific Question:   Supervising Provider    Answer:   Cassandria Anger [[id number]]    Follow-up: Return if symptoms worsen or fail to improve.  Wilfred Lacy, NP

## 2016-05-19 NOTE — Assessment & Plan Note (Signed)
developed generalized allergic rash after 2doses of ranitidine. Patient declined use of sucralfate due to size of pill. She wants to resume use of omeprazole.

## 2016-05-19 NOTE — Progress Notes (Signed)
Pre visit review using our clinic review tool, if applicable. No additional management support is needed unless otherwise documented below in the visit note. 

## 2016-06-12 ENCOUNTER — Ambulatory Visit (INDEPENDENT_AMBULATORY_CARE_PROVIDER_SITE_OTHER)
Admission: RE | Admit: 2016-06-12 | Discharge: 2016-06-12 | Disposition: A | Discharge status: Home / Self Care | Payer: MEDICARE | Source: Ambulatory Visit | Attending: Internal Medicine | Admitting: Internal Medicine

## 2016-06-12 DIAGNOSIS — M810 Age-related osteoporosis without current pathological fracture: Secondary | ICD-10-CM | POA: Diagnosis not present

## 2016-06-19 ENCOUNTER — Encounter: Payer: Self-pay | Admitting: Internal Medicine

## 2016-07-02 DIAGNOSIS — Z1231 Encounter for screening mammogram for malignant neoplasm of breast: Secondary | ICD-10-CM | POA: Diagnosis not present

## 2016-07-02 DIAGNOSIS — Z124 Encounter for screening for malignant neoplasm of cervix: Secondary | ICD-10-CM | POA: Diagnosis not present

## 2016-07-02 LAB — HM MAMMOGRAPHY

## 2016-07-03 ENCOUNTER — Encounter: Payer: Self-pay | Admitting: Internal Medicine

## 2016-07-29 ENCOUNTER — Encounter: Payer: Self-pay | Admitting: Geriatric Medicine

## 2016-10-18 NOTE — Progress Notes (Signed)
Subjective:    Patient ID: Jennifer Stephenson, female    DOB: 07-11-1951, 66 y.o.   MRN: [id number]  HPI The patient is here for follow up of dexa results.  Osteoporosis:  Dexa was done 06/15/16:  Spine -1.2 (down 3.7%) , RFN  -2.3, LFN  -2.6 (Down 1.4%).  She has had a fracture in the past - both wrists when 15.   She is exercising regularly - walks most days.  She is taking calcium and vitamin D daily  (1000 of vitamin D, 1300 of calcium a day).  She is still smoking - she does and does not want to quit.  She does not drink alcohol.  She was on an oral medication in the past and was taken off for "being on it too long" - she is not sure how long she was on it.  She does not recall side effects.  Abdominal bloating:  She has constant abdominal bloating.  It can be worse after eating, but still there.  She is constipated. She does not take anything for constipation.    Anxiety: She takes ativan as needed.  She needs a refill today.   Tightness in chest:  She has intermittent tightness in her chest, presumed COPD.  She is still smoking.  She uses the albuterol inhaler only as neede, which helps.  She denies daily cough, wheeze or SOB.    Medications and allergies reviewed with patient and updated if appropriate.  Patient Active Problem List   Diagnosis Date Noted  . Dizziness 04/28/2016  . Osteoporosis 10/04/2015  . Elevated hematocrit 06/10/2014  . GERD (gastroesophageal reflux disease) 10/25/2013  . Abnormal EKG 06/01/2012  . Anxiety state 09/19/2008  . COLONIC POLYPS, HX OF 09/19/2008  . THYROID NODULE 03/28/2008  . Hyperlipidemia 03/07/2008  . CIGARETTE SMOKER 03/07/2008  . Esophageal dysphagia 03/07/2008  . INTERSTITIAL CYSTITIS 07/29/2007  . Essential hypertension 05/27/2007    Current Outpatient Prescriptions on File Prior to Visit  Medication Sig Dispense Refill  . albuterol (PROVENTIL HFA;VENTOLIN HFA) 108 (90 Base) MCG/ACT inhaler Inhale 2 puffs into the lungs every 6  (six) hours as needed for wheezing or shortness of breath. 1 Inhaler 2  . Calcium-Vitamin D 600-125 MG-UNIT TABS Take by mouth daily. Take 2     . cholecalciferol (VITAMIN D) 400 UNITS TABS Take by mouth daily.      Marland Kitchen diltiazem (DILACOR XR) 180 MG 24 hr capsule TAKE ONE CAPSULE BY MOUTH DAILY 90 capsule 3  . hydrOXYzine (ATARAX/VISTARIL) 25 MG tablet Take 1 tablet (25 mg total) by mouth every 8 (eight) hours as needed for itching. 30 tablet 0  . LORazepam (ATIVAN) 1 MG tablet TAKE ONE-HALF TABLET BY MOUTH EVERY 6 TO 8 HOURS AS NEEDED, NOT A MAINTENANCE MEDICATION. 30 tablet 1  . vitamin C (ASCORBIC ACID) 500 MG tablet Take 500 mg by mouth daily.      . vitamin E (VITAMIN E) 400 UNIT capsule Take 400 Units by mouth daily.     No current facility-administered medications on file prior to visit.     Past Medical History:  Diagnosis Date  . Adenomatous colon polyp 2007 & 2010  . Allergy    seasonal  . Anemia    PMH of   . DDD (degenerative disc disease), cervical   . Dysphagia   . Endometriosis   . Fibrocystic breast disease   . Fracture of wrist, closed    bilaterally age 89 (  fell  skating )  . GERD (gastroesophageal reflux disease)   . HTN (hypertension)   . Hyperlipidemia   . Interstitial cystitis   . Osteopenia    [place] DEXA  . Syncope 2007   seen in ER ; Dx: dehydration    Past Surgical History:  Procedure Laterality Date  . ABDOMINAL HYSTERECTOMY  1996   & BSO FOR ENDOMETRIOSIS  . CERVICAL FUSION     X3:.2001 C4-6, 2007 C6-7,2012C3-4 (?)  . COLONOSCOPY  2013   negative  . COLONOSCOPY W/ POLYPECTOMY  2007 & 2011   ADENOMATOUS polyp, internal hemorrhoids  . EGD WITH ESOPHAGEAL DILATION  05/15/2009,07/03/11  . ESOPHAGEAL MANOMETRY  10/27/2011   Procedure: ESOPHAGEAL MANOMETRY (EM);  Surgeon: Gatha Mayer, MD;  Location: [facility];  Service: Endoscopy;  Laterality: N/A;  . SEPTOPLASTY    . TUBAL LIGATION      Social History   Social History  . Marital  status: Married    Spouse name: N/A  . Number of children: 2  . Years of education: N/A   Occupational History  . DISABLED    Social History Main Topics  . Smoking status: Current Every Day Smoker    Packs/day: 0.50  . Smokeless tobacco: Never Used     Comment: smoked 1967- present, up to 1 ppd; now 1/2-3/4 ppd  . Alcohol use No  . Drug use: No  . Sexual activity: Not on file   Other Topics Concern  . Not on file   Social History Narrative    [employer] DAILY    Family History  Problem Relation Age of Onset  . Dementia Mother   . Ulcers Sister   . Stroke Sister      X 2; both > 65  . Ulcers Brother   . Diabetes Paternal Uncle   . Cancer Maternal Grandmother     CNS  . Heart attack Brother      in 25s  . Cancer Brother     PANCREATIC   . Diabetes Brother     X 2  . Stroke Brother     X2 , both > 55    Review of Systems  Constitutional: Negative for fever.  Respiratory: Positive for chest tightness (occasional - relieved with albuterol). Negative for cough, shortness of breath and wheezing.   Gastrointestinal: Positive for abdominal distention (bloating) and constipation.       Objective:   Vitals:   10/20/16 0921  BP: 132/78  Pulse: 75  Resp: 16  Temp: 97.7 F (36.5 C)   Wt Readings from Last 3 Encounters:  10/20/16 93 lb (42.2 kg)  05/19/16 89 lb (40.4 kg)  05/15/16 89 lb (40.4 kg)   Body mass index is 17.01 kg/m.   Physical Exam  Constitutional: She appears well-developed and well-nourished. No distress.  HENT:  Head: Normocephalic and atraumatic.  Musculoskeletal: She exhibits no edema.  Skin: She is not diaphoretic.  Psychiatric: She has a normal mood and affect. Her behavior is normal. Judgment and thought content normal.           Assessment & Plan:    See Problem List for Assessment and Plan of chronic medical problems.

## 2016-10-20 ENCOUNTER — Encounter: Payer: Self-pay | Admitting: Internal Medicine

## 2016-10-20 ENCOUNTER — Ambulatory Visit (INDEPENDENT_AMBULATORY_CARE_PROVIDER_SITE_OTHER): Payer: MEDICARE | Admitting: Internal Medicine

## 2016-10-20 VITALS — BP 132/78 | HR 75 | Temp 97.7°F | Resp 16 | Wt 93.0 lb

## 2016-10-20 DIAGNOSIS — M810 Age-related osteoporosis without current pathological fracture: Secondary | ICD-10-CM | POA: Diagnosis not present

## 2016-10-20 DIAGNOSIS — Z23 Encounter for immunization: Secondary | ICD-10-CM

## 2016-10-20 DIAGNOSIS — I10 Essential (primary) hypertension: Secondary | ICD-10-CM | POA: Diagnosis not present

## 2016-10-20 DIAGNOSIS — F411 Generalized anxiety disorder: Secondary | ICD-10-CM | POA: Diagnosis not present

## 2016-10-20 DIAGNOSIS — K219 Gastro-esophageal reflux disease without esophagitis: Secondary | ICD-10-CM

## 2016-10-20 DIAGNOSIS — F17200 Nicotine dependence, unspecified, uncomplicated: Secondary | ICD-10-CM | POA: Diagnosis not present

## 2016-10-20 MED ORDER — LORAZEPAM 1 MG PO TABS: ORAL_TABLET | ORAL | 1 refills | Status: DC

## 2016-10-20 MED ORDER — OMEPRAZOLE 20 MG PO CPDR: 20.0000 mg | DELAYED_RELEASE_CAPSULE | Freq: Every day | ORAL | 3 refills | Status: DC

## 2016-10-20 NOTE — Patient Instructions (Addendum)
Reclast / Zoledronic Acid injection (Osteoporosis) - once a year infusion What is this medicine? ZOLEDRONIC ACID (ZOE le dron ik AS id) lowers the amount of calcium loss from bone. It is used to treat Paget's disease and osteoporosis in women. This medicine may be used for other purposes; ask your health care provider or pharmacist if you have questions. COMMON BRAND NAME(S): Reclast, Zometa What should I tell my health care provider before I take this medicine? They need to know if you have any of these conditions: -aspirin-sensitive asthma -cancer, especially if you are receiving medicines used to treat cancer -dental disease or wear dentures -infection -kidney disease -low levels of calcium in the blood -past surgery on the parathyroid gland or intestines -receiving corticosteroids like dexamethasone or prednisone -an unusual or allergic reaction to zoledronic acid, other medicines, foods, dyes, or preservatives -pregnant or trying to get pregnant -breast-feeding How should I use this medicine? This medicine is for infusion into a vein. It is given by a health care professional in a hospital or clinic setting. Talk to your pediatrician regarding the use of this medicine in children. This medicine is not approved for use in children. Overdosage: If you think you have taken too much of this medicine contact a poison control center or emergency room at once. NOTE: This medicine is only for you. Do not share this medicine with others. What if I miss a dose? It is important not to miss your dose. Call your doctor or health care professional if you are unable to keep an appointment. What may interact with this medicine? -certain antibiotics given by injection -NSAIDs, medicines for pain and inflammation, like ibuprofen or naproxen -some diuretics like bumetanide, furosemide -teriparatide This list may not describe all possible interactions. Give your health care provider a list of all  the medicines, herbs, non-prescription drugs, or dietary supplements you use. Also tell them if you smoke, drink alcohol, or use illegal drugs. Some items may interact with your medicine. What should I watch for while using this medicine? Visit your doctor or health care professional for regular checkups. It may be some time before you see the benefit from this medicine. Do not stop taking your medicine unless your doctor tells you to. Your doctor may order blood tests or other tests to see how you are doing. Women should inform their doctor if they wish to become pregnant or think they might be pregnant. There is a potential for serious side effects to an unborn child. Talk to your health care professional or pharmacist for more information. You should make sure that you get enough calcium and vitamin D while you are taking this medicine. Discuss the foods you eat and the vitamins you take with your health care professional. Some people who take this medicine have severe bone, joint, and/or muscle pain. This medicine may also increase your risk for jaw problems or a broken thigh bone. Tell your doctor right away if you have severe pain in your jaw, bones, joints, or muscles. Tell your doctor if you have any pain that does not go away or that gets worse. Tell your dentist and dental surgeon that you are taking this medicine. You should not have major dental surgery while on this medicine. See your dentist to have a dental exam and fix any dental problems before starting this medicine. Take good care of your teeth while on this medicine. Make sure you see your dentist for regular follow-up appointments. What side effects may I  notice from receiving this medicine? Side effects that you should report to your doctor or health care professional as soon as possible: -allergic reactions like skin rash, itching or hives, swelling of the face, lips, or tongue -anxiety, confusion, or depression -breathing  problems -changes in vision -eye pain -feeling faint or lightheaded, falls -jaw pain, especially after dental work -mouth sores -muscle cramps, stiffness, or weakness -redness, blistering, peeling or loosening of the skin, including inside the mouth -trouble passing urine or change in the amount of urine Side effects that usually do not require medical attention (report to your doctor or health care professional if they continue or are bothersome): -bone, joint, or muscle pain -constipation -diarrhea -fever -hair loss -irritation at site where injected -loss of appetite -nausea, vomiting -stomach upset -trouble sleeping -trouble swallowing -weak or tired This list may not describe all possible side effects. Call your doctor for medical advice about side effects. You may report side effects to FDA at 1-800-FDA-1088. Where should I keep my medicine? This drug is given in a hospital or clinic and will not be stored at home. NOTE: This sheet is a summary. It may not cover all possible information. If you have questions about this medicine, talk to your doctor, pharmacist, or health care provider.  2018 Elsevier/Gold Standard (2013-11-26 14:19:57)    information regarding prolia (injection 2/year in our office):  Official reprint from UpToDate  www.uptodate.com 2017 UpToDate   The content on the UpToDate website is not intended nor recommended as a substitute for medical advice, diagnosis, or treatment. Always seek the advice of your own physician or other qualified health care professional regarding any medical questions or conditions. The use of UpToDate content is governed by the UpToDate Terms of Use. 2017 UpToDate, [place] rights reserved.  Denosumab: Patient drug information  Copyright [phone] [employer], [place] rights reserved.  (For additional information see "Denosumab: Drug information" and see "Denosumab: Pediatric drug information") Brand Names: [place]  Prolia;   Xgeva What is this drug used for?   It is used to treat soft, brittle bones (osteoporosis).   It is used for bone growth.   It is used when treating some cancers.   It is used to treat high calcium levels in patients with cancer.   It may be given to you for other reasons. Talk with the doctor. What do I need to tell my doctor BEFORE I take this drug?   If you have an allergy to denosumab or any other part of this drug.   If you are allergic to any drugs like this one, any other drugs, foods, or other substances. Tell your doctor about the allergy and what signs you had, like rash; hives; itching; shortness of breath; wheezing; cough; swelling of face, lips, tongue, or throat; or any other signs.   If you have low calcium levels.   If you are breast-feeding or plan to breast-feed.   If you are pregnant or may be pregnant. Some brands of this drug are not for use during pregnancy.  This is not a list of all drugs or health problems that interact with this drug.  Tell your doctor and pharmacist about all of your drugs (prescription or OTC, natural products, vitamins) and health problems. You must check to make sure that it is safe for you to take this drug with all of your drugs and health problems. Do not start, stop, or change the dose of any drug without checking with your doctor. What  are some things I need to know or do while I take this drug?  All products:   Tell all of your health care providers that you take this drug. This includes your doctors, nurses, pharmacists, and dentists.   This drug may raise the chance of a broken leg. Talk with the doctor.   Have blood work checked as you have been told by the doctor. Talk with the doctor.   Have a bone density test as you have been told by your doctor. Talk with your doctor.   Take calcium and vitamin D as you were told by your doctor.   Have a dental exam before starting this drug.   Take good care of your teeth. See a  dentist often.   If you smoke, talk with your doctor.   If you are a man and your sex partner is pregnant or gets pregnant at any time while you are being treated, talk with your doctor.   This drug may cause harm to the unborn baby if you take it while you are pregnant.  Xgeva:   Use birth control you can trust to prevent pregnancy while you are taking this drug and for 5 months after you stop taking it.   If you get pregnant while taking this drug or within 5 months after your last dose, call your doctor right away.  Prolia:   Very bad infections have been reported with use of this drug. If you have any infection, are taking antibiotics now or in the recent past, or have many infections, talk with your doctor.   You may have more chance of getting an infection. Wash hands often. Stay away from people with infections, colds, or flu.   This drug may lower blood calcium levels. If you already have low blood calcium, it may get worse with this drug. Sometimes, blood calcium levels have stayed low for weeks or months after use of this drug. Talk with the doctor.   After this drug is stopped, the chance of a broken bone is raised. This includes bones in the spine. The chance of having more than 1 broken bone in the spine is raised if you have ever had a broken bone in your spine. Do not stop treatment with this drug without talking to your doctor.   Use birth control that you can trust to prevent pregnancy while taking this drug.   If you are pregnant or you get pregnant while taking this drug, call your doctor right away. What are some side effects that I need to call my doctor about right away?  WARNING/CAUTION: Even though it may be rare, some people may have very bad and sometimes deadly side effects when taking a drug. Tell your doctor or get medical help right away if you have any of the following signs or symptoms that may be related to a very bad side effect:  All products:   Signs  of an allergic reaction, like rash; hives; itching; red, swollen, blistered, or peeling skin with or without fever; wheezing; tightness in the chest or throat; trouble breathing or talking; unusual hoarseness; or swelling of the mouth, face, lips, tongue, or throat.   Signs of low calcium levels like muscle cramps or spasms, numbness and tingling, or seizures.   Mouth sores.   Swelling in the arms or legs.   Feeling very tired or weak.   Any new or strange groin, hip, or thigh pain.   Very bad bone, joint,  or muscle pain.   This drug may cause jawbone problems. The chance may be higher the longer you take this drug. The chance may be higher if you have cancer, dental problems, dentures that do not fit well, anemia, blood clotting problems, or an infection. The chance may also be higher if you are having dental work, getting chemo or radiation, or taking other drugs that may cause jawbone problems like some steroid drugs. There are many drugs that can do this. Ask your doctor or pharmacist if you are not sure. Call your doctor right away if you have jaw swelling or pain.  Xgeva:   Muscle pain or weakness.   Seizures.   Shortness of breath.   High calcium levels have happened after this drug was stopped in people whose bones were still growing. Call your doctor right away if you have signs of high calcium levels like weakness, confusion, feeling tired, headache, upset stomach or throwing up, constipation, or bone pain.  Prolia:   Signs of infection like fever, chills, very bad sore throat, ear or sinus pain, cough, more sputum or change in color of sputum, pain with passing urine, mouth sores, or wound that will not heal.   Signs of a pancreas problem (pancreatitis) like very bad stomach pain, very bad back pain, or very bad upset stomach or throwing up.   Chest pain.   A heartbeat that does not feel normal.   Very bad skin irritation.   Bladder pain or pain when passing urine or  change in how much urine is passed.   Passing urine more often. What are some other side effects of this drug?  All drugs may cause side effects. However, many people have no side effects or only have minor side effects. Call your doctor or get medical help if any of these side effects or any other side effects bother you or do not go away:  All products:   Back pain.  Xgeva:   Hard stools (constipation).   Not hungry.   Joint pain.   Feeling tired or weak.   Headache.   Upset stomach or throwing up.   Loose stools (diarrhea).  Prolia:   Muscle or joint pain.   Sore throat.   Runny nose.   Pain in arms or legs.  These are not all of the side effects that may occur. If you have questions about side effects, call your doctor. Call your doctor for medical advice about side effects.  You may report side effects to your national health agency. How is this drug best taken?  Use this drug as ordered by your doctor. Read all information given to you. Follow all instructions closely.   It is given as a shot into the fatty part of the skin. What do I do if I miss a dose?   Call your doctor to find out what to do. How do I store and/or throw out this drug?   If you need to store this drug at home, talk with your doctor, nurse, or pharmacist about how to store it. General drug facts   If your symptoms or health problems do not get better or if they become worse, call your doctor.   Do not share your drugs with others and do not take anyone else's drugs.   Keep a list of all your drugs (prescription, natural products, vitamins, OTC) with you. Give this list to your doctor.   Talk with the doctor before starting any new  drug, including prescription or OTC, natural products, or vitamins.   Keep all drugs in a safe place. Keep all drugs out of the reach of children and pets.   Check with your pharmacist about how to throw out unused drugs.   Some drugs may have another  patient information leaflet. If you have any questions about this drug, please talk with your doctor, nurse, pharmacist, or other health care provider.   If you think there has been an overdose, call your poison control center or get medical care right away. Be ready to tell or show what was taken, how much, and when it happened. Use of UpToDate is subject to the Subscription and License Agreement.  Topic 15576 Version 104.0   Teriparatide injection (Forteo) What is this medicine? TERIPARATIDE (terr ih PAR a tyd) increases bone mass and strength. It helps make healthy bone and to slow bone loss. This medicine is used to prevent bone fractures. This medicine may be used for other purposes; ask your health care provider or pharmacist if you have questions. COMMON BRAND NAME(S): Forteo What should I tell my health care provider before I take this medicine? They need to know if you have any of these conditions: -bone disease other than osteoporosis -high levels of calcium in the blood -history of cancer in the bone -kidney stone -Paget's disease -parathyroid disease -receiving radiation therapy -an unusual or allergic reaction to teriparatide, other medicines, foods, dyes, or preservatives -pregnant or trying to get pregnant -breast-feeding How should I use this medicine? This medicine is for injection under the skin. You will be taught how to prepare and give this medicine. Use exactly as directed. Take your medicine at regular intervals. Do not take your medicine more often than directed. It is important that you put your used needles and pens in a special sharps container. Do not put them in a trash can. If you do not have a sharps container, call your pharmacist or health care provider to get one. A special MedGuide will be given to you by the pharmacist with each prescription and refill. Be sure to read this information carefully each time. Talk to your pediatrician regarding the use of  this medicine in children. Special care may be needed. Overdosage: If you think you have taken too much of this medicine contact a poison control center or emergency room at once. NOTE: This medicine is only for you. Do not share this medicine with others. What if I miss a dose? If you miss a dose, take it as soon as you can. If it is almost time for your next dose, take only that dose. Do not take double or extra doses. What may interact with this medicine? -digoxin This list may not describe all possible interactions. Give your health care provider a list of all the medicines, herbs, non-prescription drugs, or dietary supplements you use. Also tell them if you smoke, drink alcohol, or use illegal drugs. Some items may interact with your medicine. What should I watch for while using this medicine? Visit your doctor or health care professional for regular checks on your progress. Your doctor may order blood tests and other tests to see how you are doing. You should make sure you get enough calcium and vitamin D while you are taking this medicine, unless your doctor tells you not to. Discuss the foods you eat and the vitamins you take with your health care professional. Dennis Bast may get drowsy or dizzy. Do not drive, use machinery,  or do anything that needs mental alertness until you know how this medicine affects you. Do not stand or sit up quickly, especially if you are an older patient. This reduces the risk of dizzy or fainting spells. Talk to your doctor about your risk of cancer. You may be more at risk for certain types of cancers if you take this medicine. What side effects may I notice from receiving this medicine? Side effects that you should report to your doctor or health care professional as soon as possible: -allergic reactions like skin rash, itching or hives, swelling of the face, lips, or tongue -blood in the urine; pain in the lower back or side; pain when urinating -signs and symptoms  of low blood pressure like dizziness; feeling faint or lightheaded, falls; unusually weak or tired -signs and symptoms of increased calcium like nausea; vomiting; constipation; low energy; or muscle weakness Side effects that usually do not require medical attention (report these to your doctor or health care professional if they continue or are bothersome): -headache -joint pain -nausea -pain, redness, irritation or swelling at the injection site -stomach upset This list may not describe all possible side effects. Call your doctor for medical advice about side effects. You may report side effects to FDA at 1-800-FDA-1088. Where should I keep my medicine? Keep out of the reach of children. Store the pens in a refrigerator between 2 and 8 degrees C (36 and 46 degrees F). Do not freeze. Use the pen quickly after taking out of the refrigerator and recap and return to refrigerator right after using. Protect from light. Throw away any unused medicine 28 days after the first injection from the pen. Throw away any unused medicine after the expiration date on the label. NOTE: This sheet is a summary. It may not cover all possible information. If you have questions about this medicine, talk to your doctor, pharmacist, or health care provider.  2018 Elsevier/Gold Standard (2015-11-19 10:23:57)    Osteoporosis Osteoporosis is the thinning and loss of density in the bones. Osteoporosis makes the bones more brittle, fragile, and likely to break (fracture). Over time, osteoporosis can cause the bones to become so weak that they fracture after a simple fall. The bones most likely to fracture are the bones in the hip, wrist, and spine. What are the causes? The exact cause is not known. What increases the risk? Anyone can develop osteoporosis. You may be at greater risk if you have a family history of the condition or have poor nutrition. You may also have a higher risk if you are:  Female.  67 years old  or older.  A smoker.  Not physically active.  White or Asian.  Slender. What are the signs or symptoms? A fracture might be the first sign of the disease, especially if it results from a fall or injury that would not usually cause a bone to break. Other signs and symptoms include:  Low back and neck pain.  Stooped posture.  Height loss. How is this diagnosed? To make a diagnosis, your health care provider may:  Take a medical history.  Perform a physical exam.  Order tests, such as:  A bone mineral density test.  A dual-energy X-ray absorptiometry test. How is this treated? The goal of osteoporosis treatment is to strengthen your bones to reduce your risk of a fracture. Treatment may involve:  Making lifestyle changes, such as:  Eating a diet rich in calcium.  Doing weight-bearing and muscle-strengthening exercises.  Stopping  tobacco use.  Limiting alcohol intake.  Taking medicine to slow the process of bone loss or to increase bone density.  Monitoring your levels of calcium and vitamin D. Follow these instructions at home:  Include calcium and vitamin D in your diet. Calcium is important for bone health, and vitamin D helps the body absorb calcium.  Perform weight-bearing and muscle-strengthening exercises as directed by your health care provider.  Do not use any tobacco products, including cigarettes, chewing tobacco, and electronic cigarettes. If you need help quitting, ask your health care provider.  Limit your alcohol intake.  Take medicines only as directed by your health care provider.  Keep all follow-up visits as directed by your health care provider. This is important.  Take precautions at home to lower your risk of falling, such as:  Keeping rooms well lit and clutter free.  Installing safety rails on stairs.  Using rubber mats in the bathroom and other areas that are often wet or slippery. Get help right away if: You fall or injure  yourself. This information is not intended to replace advice given to you by your health care provider. Make sure you discuss any questions you have with your health care provider. Document Released: 04/09/2005 Document Revised: 12/03/2015 Document Reviewed: 12/08/2013 Elsevier Interactive Patient Education  2017 [employer].

## 2016-10-20 NOTE — Assessment & Plan Note (Signed)
Encouraged smoking cessation 

## 2016-10-20 NOTE — Assessment & Plan Note (Signed)
BP well controlled Current regimen effective and well tolerated Continue current medications at current doses  

## 2016-10-20 NOTE — Assessment & Plan Note (Signed)
Taking ativan as needed refilled today

## 2016-10-20 NOTE — Assessment & Plan Note (Signed)
Did not tolerate zantac - hives Not controlled with tums Will restart omeprazole 20 mg daily

## 2016-10-20 NOTE — Progress Notes (Signed)
Pre visit review using our clinic review tool, if applicable. No additional management support is needed unless otherwise documented below in the visit note. 

## 2016-10-20 NOTE — Assessment & Plan Note (Signed)
Reviewed dexa Recommended treatment Taking calcium and vitamin d daily and exercising - will continue Stressed smoking cessation Discussed treatment options - would avoid oral medications given GERD, esophageal disorder She will let me know what treatment she is interested in

## 2017-02-02 ENCOUNTER — Other Ambulatory Visit: Payer: Self-pay | Admitting: Internal Medicine

## 2017-02-02 DIAGNOSIS — F411 Generalized anxiety disorder: Secondary | ICD-10-CM

## 2017-02-02 MED ORDER — LORAZEPAM 1 MG PO TABS: ORAL_TABLET | ORAL | 0 refills | Status: DC

## 2017-02-02 NOTE — Telephone Encounter (Signed)
[place] Controlled Substance Database checked. Okay to fill RX. Last filled 12/10/16. Called Lorazepam RX into pharmacy.

## 2017-04-02 ENCOUNTER — Other Ambulatory Visit: Payer: Self-pay | Admitting: Internal Medicine

## 2017-04-02 DIAGNOSIS — F411 Generalized anxiety disorder: Secondary | ICD-10-CM

## 2017-04-02 NOTE — Telephone Encounter (Signed)
Could not locate script so I called refill into [facility] left on pharmacy vm...Jennifer Stephenson

## 2017-04-02 NOTE — Telephone Encounter (Signed)
[place] controlled substance database checked.  Ok to fill medication.  

## 2017-04-04 ENCOUNTER — Other Ambulatory Visit: Payer: Self-pay | Admitting: Internal Medicine

## 2017-04-04 DIAGNOSIS — F411 Generalized anxiety disorder: Secondary | ICD-10-CM

## 2017-04-06 NOTE — Telephone Encounter (Signed)
[facility]

## 2017-04-06 NOTE — Telephone Encounter (Signed)
[place] controlled substance database checked.  Ok to fill medication. rx printed 

## 2017-04-06 NOTE — Telephone Encounter (Signed)
Routing to dr burns, please advise, thanks 

## 2017-04-20 DIAGNOSIS — Z23 Encounter for immunization: Secondary | ICD-10-CM | POA: Diagnosis not present

## 2017-05-13 ENCOUNTER — Other Ambulatory Visit: Payer: Self-pay | Admitting: Internal Medicine

## 2017-05-17 NOTE — Progress Notes (Signed)
Subjective:    Patient ID: Jennifer Stephenson, female    DOB: 05-23-1951, 66 y.o.   MRN: [id number]  HPI The patient is here for follow up.  Hypertension: She is taking her medication daily, but did not take it today yet. She is compliant with a low sodium diet.  She denies chest pain, palpitations, edema, shortness of breath and regular headaches. She is exercising regularly - walking.  She does monitor her blood pressure at home - 130/76 - always high here.    Hyperlipidemia: She is taking her medication daily. She is compliant with a low fat/cholesterol diet. She is exercising regularly - walking 2-4 miles a day. She denies myalgias.   GERD:  She is taking her medication daily as prescribed.  She denies any GERD symptoms and feels her GERD is well controlled.   Anxiety: She has had increased stress over the past year.  She was taking her ativan daily for several months, but realized this and stopped it last month.  She has not taken it since.  She wants to take it.  She feels like she is dealing with the stress ok.  She does not want to take a daily SSRI. She still wants to take it as needed.    Smoker:  She is still smoking and is smoking less than 1 pack.  There is still too much stress to try quitting.    Osteoporosis:  We have discussed treatment options at her previous visit and given her GERD and esophageal d/o she should not be on an oral bisphosphonate.    Sometimes she has hip pain and they catch at times.  She thinks it is arthritis, but she can tolerate it now.   Medications and allergies reviewed with patient and updated if appropriate.  Patient Active Problem List   Diagnosis Date Noted  . Dizziness 04/28/2016  . Osteoporosis 10/04/2015  . Elevated hematocrit 06/10/2014  . GERD (gastroesophageal reflux disease) 10/25/2013  . Abnormal EKG 06/01/2012  . Anxiety state 09/19/2008  . COLONIC POLYPS, HX OF 09/19/2008  . THYROID NODULE 03/28/2008  . Hyperlipidemia  03/07/2008  . CIGARETTE SMOKER 03/07/2008  . Esophageal dysphagia 03/07/2008  . INTERSTITIAL CYSTITIS 07/29/2007  . Essential hypertension 05/27/2007    Current Outpatient Medications on File Prior to Visit  Medication Sig Dispense Refill  . albuterol (PROVENTIL HFA;VENTOLIN HFA) 108 (90 Base) MCG/ACT inhaler Inhale 2 puffs into the lungs every 6 (six) hours as needed for wheezing or shortness of breath. 1 Inhaler 2  . Calcium-Vitamin D 600-125 MG-UNIT TABS Take by mouth daily. Take 2     . cholecalciferol (VITAMIN D) 400 UNITS TABS Take by mouth daily.      Marland Kitchen diltiazem (TIAZAC) 180 MG 24 hr capsule Take 1 capsule (180 mg total) by mouth daily. --- Office visit needed for further refills 90 capsule 0  . hydrOXYzine (ATARAX/VISTARIL) 25 MG tablet Take 1 tablet (25 mg total) by mouth every 8 (eight) hours as needed for itching. 30 tablet 0  . LORazepam (ATIVAN) 1 MG tablet TAKE 1/2 TABLET BY MOUTH EVERY 6 TO 8 HRS AS NEEDED 30 tablet 0  . omeprazole (PRILOSEC) 20 MG capsule Take 1 capsule (20 mg total) by mouth daily. 90 capsule 3  . vitamin C (ASCORBIC ACID) 500 MG tablet Take 500 mg by mouth daily.      . vitamin E (VITAMIN E) 400 UNIT capsule Take 400 Units by mouth daily.     No  current facility-administered medications on file prior to visit.     Past Medical History:  Diagnosis Date  . Adenomatous colon polyp 2007 & 2010  . Allergy    seasonal  . Anemia    PMH of   . DDD (degenerative disc disease), cervical   . Dysphagia   . Endometriosis   . Fibrocystic breast disease   . Fracture of wrist, closed    bilaterally age 64 (  fell skating )  . GERD (gastroesophageal reflux disease)   . HTN (hypertension)   . Hyperlipidemia   . Interstitial cystitis   . Osteopenia     DEXA  . Syncope 2007   seen in ER ; Dx: dehydration    Past Surgical History:  Procedure Laterality Date  . ABDOMINAL HYSTERECTOMY  1996   & BSO FOR ENDOMETRIOSIS  . CERVICAL FUSION     X3:.2001  C4-6, 2007 C6-7,2012C3-4 (?)  . COLONOSCOPY  2013   negative  . COLONOSCOPY W/ POLYPECTOMY  2007 & 2011   ADENOMATOUS polyp, internal hemorrhoids  . EGD WITH ESOPHAGEAL DILATION  05/15/2009,07/03/11  . SEPTOPLASTY    . TUBAL LIGATION      Social History   Socioeconomic History  . Marital status: Married    Spouse name: None  . Number of children: 2  . Years of education: None  . Highest education level: None  Social Needs  . Financial resource strain: None  . Food insecurity - worry: None  . Food insecurity - inability: None  . Transportation needs - medical: None  . Transportation needs - non-medical: None  Occupational History  . Occupation: DISABLED  Tobacco Use  . Smoking status: Current Every Day Smoker    Packs/day: 0.50  . Smokeless tobacco: Never Used  . Tobacco comment: smoked 1967- present, up to 1 ppd; now 1/2-3/4 ppd  Substance and Sexual Activity  . Alcohol use: No  . Drug use: No  . Sexual activity: None  Other Topics Concern  . None  Social History Narrative    CAFFEINE 3 CUPS DAILY    Family History  Problem Relation Age of Onset  . Dementia Mother   . Ulcers Sister   . Stroke Sister         X 2; both > 65  . Ulcers Brother   . Diabetes Paternal Uncle   . Cancer Maternal Grandmother        CNS  . Heart attack Brother         in 81s  . Cancer Brother        PANCREATIC   . Diabetes Brother        X 2  . Stroke Brother        X2 , both > 55    Review of Systems  Constitutional: Negative for chills and fever.  Respiratory: Negative for cough, shortness of breath and wheezing.   Cardiovascular: Negative for chest pain, palpitations and leg swelling.  Neurological: Negative for light-headedness and headaches.       Objective:   Vitals:   05/18/17 1349  BP: (!) 150/86  Pulse: 95  Resp: 16  Temp: 98 F (36.7 C)  SpO2: 97%   Wt Readings from Last 3 Encounters:  05/18/17 89 lb (40.4 kg)  10/20/16 93 lb (42.2 kg)  05/19/16 89 lb  (40.4 kg)   Body mass index is 16.28 kg/m.   Physical Exam    Constitutional: Appears well-developed and well-nourished. No distress.  HENT:  Head: Normocephalic and atraumatic.  Neck: Neck supple. No tracheal deviation present. No thyromegaly present.  No cervical lymphadenopathy Cardiovascular: Normal rate, regular rhythm and normal heart sounds.   No murmur heard. No carotid bruit .  No edema Pulmonary/Chest: Effort normal and breath sounds normal. No respiratory distress. No has no wheezes. No rales.  Skin: Skin is warm and dry. Not diaphoretic.  Psychiatric: Normal mood and affect. Behavior is normal.      Assessment & Plan:    See Problem List for Assessment and Plan of chronic medical problems.

## 2017-05-17 NOTE — Patient Instructions (Addendum)
  Test(s) ordered today. Your results will be released to [facility] (or called to you) after review, usually within 72hours after test completion. If any changes need to be made, you will be notified at that same time.  All other Health Maintenance issues reviewed.   All recommended immunizations and age-appropriate screenings are up-to-date or discussed.  No immunizations administered today.   Medications reviewed and updated.  No changes recommended at this time.  Your prescription(s) have been submitted to your pharmacy. Please take as directed and contact our office if you believe you are having problem(s) with the medication(s).   Please followup in one year ; wellness with Jennifer Stephenson

## 2017-05-18 ENCOUNTER — Encounter: Payer: Self-pay | Admitting: Internal Medicine

## 2017-05-18 ENCOUNTER — Ambulatory Visit (INDEPENDENT_AMBULATORY_CARE_PROVIDER_SITE_OTHER): Payer: MEDICARE | Admitting: Internal Medicine

## 2017-05-18 ENCOUNTER — Other Ambulatory Visit (INDEPENDENT_AMBULATORY_CARE_PROVIDER_SITE_OTHER): Payer: MEDICARE

## 2017-05-18 VITALS — BP 150/86 | HR 95 | Temp 98.0°F | Resp 16 | Wt 89.0 lb

## 2017-05-18 DIAGNOSIS — E7849 Other hyperlipidemia: Secondary | ICD-10-CM

## 2017-05-18 DIAGNOSIS — M810 Age-related osteoporosis without current pathological fracture: Secondary | ICD-10-CM | POA: Diagnosis not present

## 2017-05-18 DIAGNOSIS — J4520 Mild intermittent asthma, uncomplicated: Secondary | ICD-10-CM

## 2017-05-18 DIAGNOSIS — F411 Generalized anxiety disorder: Secondary | ICD-10-CM

## 2017-05-18 DIAGNOSIS — J45909 Unspecified asthma, uncomplicated: Secondary | ICD-10-CM | POA: Insufficient documentation

## 2017-05-18 DIAGNOSIS — I10 Essential (primary) hypertension: Secondary | ICD-10-CM | POA: Diagnosis not present

## 2017-05-18 DIAGNOSIS — K219 Gastro-esophageal reflux disease without esophagitis: Secondary | ICD-10-CM

## 2017-05-18 LAB — CBC WITH DIFFERENTIAL/PLATELET
BASOS PCT: 1.2 % (ref 0.0–3.0)
Basophils Absolute: 0.1 10*3/uL (ref 0.0–0.1)
EOS PCT: 1.1 % (ref 0.0–5.0)
Eosinophils Absolute: 0.1 10*3/uL (ref 0.0–0.7)
HEMATOCRIT: 45.9 % (ref 36.0–46.0)
HEMOGLOBIN: 15.6 g/dL — AB (ref 12.0–15.0)
LYMPHS PCT: 19.4 % (ref 12.0–46.0)
Lymphs Abs: 1.8 10*3/uL (ref 0.7–4.0)
MCHC: 34 g/dL (ref 30.0–36.0)
MCV: 88.8 fl (ref 78.0–100.0)
MONOS PCT: 6.8 % (ref 3.0–12.0)
Monocytes Absolute: 0.6 10*3/uL (ref 0.1–1.0)
Neutro Abs: 6.5 10*3/uL (ref 1.4–7.7)
Neutrophils Relative %: 71.5 % (ref 43.0–77.0)
Platelets: 209 10*3/uL (ref 150.0–400.0)
RBC: 5.17 Mil/uL — AB (ref 3.87–5.11)
RDW: 14.6 % (ref 11.5–15.5)
WBC: 9.2 10*3/uL (ref 4.0–10.5)

## 2017-05-18 LAB — COMPREHENSIVE METABOLIC PANEL
ALBUMIN: 4.6 g/dL (ref 3.5–5.2)
ALT: 10 U/L (ref 0–35)
AST: 14 U/L (ref 0–37)
Alkaline Phosphatase: 72 U/L (ref 39–117)
BUN: 23 mg/dL (ref 6–23)
CHLORIDE: 106 meq/L (ref 96–112)
CO2: 24 mEq/L (ref 19–32)
Calcium: 10.2 mg/dL (ref 8.4–10.5)
Creatinine, Ser: 0.72 mg/dL (ref 0.40–1.20)
GFR: 85.81 mL/min (ref >60.00)
Glucose, Bld: 85 mg/dL (ref 70–99)
POTASSIUM: 4.2 meq/L (ref 3.5–5.1)
SODIUM: 140 meq/L (ref 135–145)
Total Bilirubin: 0.8 mg/dL (ref 0.2–1.2)
Total Protein: 7.3 g/dL (ref 6.0–8.3)

## 2017-05-18 LAB — LIPID PANEL
CHOLESTEROL: 191 mg/dL (ref 0–200)
HDL: 57.1 mg/dL (ref >39.00)
LDL CALC: 116 mg/dL — AB (ref 0–99)
NonHDL: 133.61
Total CHOL/HDL Ratio: 3
Triglycerides: 88 mg/dL (ref 0.0–149.0)
VLDL: 17.6 mg/dL (ref 0.0–40.0)

## 2017-05-18 MED ORDER — LORAZEPAM 1 MG PO TABS: ORAL_TABLET | ORAL | 0 refills | Status: DC

## 2017-05-18 MED ORDER — DILTIAZEM HCL ER BEADS 180 MG PO CP24: 180.0000 mg | ORAL_CAPSULE | Freq: Every day | ORAL | 3 refills | Status: DC

## 2017-05-18 MED ORDER — ALBUTEROL SULFATE HFA 108 (90 BASE) MCG/ACT IN AERS: 2.0000 | INHALATION_SPRAY | Freq: Four times a day (QID) | RESPIRATORY_TRACT | 2 refills | Status: DC | PRN

## 2017-05-18 NOTE — Assessment & Plan Note (Addendum)
Uses albuterol as needed with colds Will refill so she can use it prn

## 2017-05-18 NOTE — Assessment & Plan Note (Signed)
Would be interested in prolia Currently not covered by insurance  - will see if it is covered by insurance next year - she will be switching insurance

## 2017-05-18 NOTE — Assessment & Plan Note (Signed)
Check lipid panel  Continue daily statin Regular exercise and healthy diet encouraged  

## 2017-05-18 NOTE — Assessment & Plan Note (Signed)
GERD controlled Continue daily medication  

## 2017-05-18 NOTE — Assessment & Plan Note (Signed)
Taking ativan only as needed Deferred a daily SSRI

## 2017-05-18 NOTE — Assessment & Plan Note (Signed)
Taking ativan only as needed - for several months she was taking it daily, but stopped than  Will take only prn Deferred daily SSRI

## 2017-05-18 NOTE — Assessment & Plan Note (Signed)
BP well controlled at home Has white coat htn Current regimen effective and well tolerated Continue current medications at current doses cmp

## 2017-05-20 ENCOUNTER — Encounter: Payer: Self-pay | Admitting: Internal Medicine

## 2017-05-25 DIAGNOSIS — H6061 Unspecified chronic otitis externa, right ear: Secondary | ICD-10-CM | POA: Diagnosis not present

## 2017-05-25 DIAGNOSIS — J370 Chronic laryngitis: Secondary | ICD-10-CM | POA: Diagnosis not present

## 2017-05-25 DIAGNOSIS — J320 Chronic maxillary sinusitis: Secondary | ICD-10-CM | POA: Diagnosis not present

## 2017-05-25 DIAGNOSIS — H6121 Impacted cerumen, right ear: Secondary | ICD-10-CM | POA: Diagnosis not present

## 2017-05-25 DIAGNOSIS — J322 Chronic ethmoidal sinusitis: Secondary | ICD-10-CM | POA: Diagnosis not present

## 2017-06-09 DIAGNOSIS — J322 Chronic ethmoidal sinusitis: Secondary | ICD-10-CM | POA: Diagnosis not present

## 2017-06-09 DIAGNOSIS — H6121 Impacted cerumen, right ear: Secondary | ICD-10-CM | POA: Diagnosis not present

## 2017-06-09 DIAGNOSIS — H6501 Acute serous otitis media, right ear: Secondary | ICD-10-CM | POA: Diagnosis not present

## 2017-06-09 DIAGNOSIS — J320 Chronic maxillary sinusitis: Secondary | ICD-10-CM | POA: Diagnosis not present

## 2017-06-09 DIAGNOSIS — H6061 Unspecified chronic otitis externa, right ear: Secondary | ICD-10-CM | POA: Diagnosis not present

## 2017-06-16 DIAGNOSIS — H6501 Acute serous otitis media, right ear: Secondary | ICD-10-CM | POA: Diagnosis not present

## 2017-06-16 DIAGNOSIS — J320 Chronic maxillary sinusitis: Secondary | ICD-10-CM | POA: Diagnosis not present

## 2017-06-16 DIAGNOSIS — J322 Chronic ethmoidal sinusitis: Secondary | ICD-10-CM | POA: Diagnosis not present

## 2017-06-16 DIAGNOSIS — H6061 Unspecified chronic otitis externa, right ear: Secondary | ICD-10-CM | POA: Diagnosis not present

## 2017-08-04 DIAGNOSIS — Z1231 Encounter for screening mammogram for malignant neoplasm of breast: Secondary | ICD-10-CM | POA: Diagnosis not present

## 2017-08-04 DIAGNOSIS — Z01419 Encounter for gynecological examination (general) (routine) without abnormal findings: Secondary | ICD-10-CM | POA: Diagnosis not present

## 2017-08-04 LAB — HM MAMMOGRAPHY: HM Mammogram: NORMAL (ref 0–4)

## 2017-08-06 ENCOUNTER — Encounter: Payer: Self-pay | Admitting: Internal Medicine

## 2017-10-10 ENCOUNTER — Other Ambulatory Visit: Payer: Self-pay | Admitting: Internal Medicine

## 2017-10-13 ENCOUNTER — Ambulatory Visit (INDEPENDENT_AMBULATORY_CARE_PROVIDER_SITE_OTHER): Payer: Medicare HMO | Admitting: Family Medicine

## 2017-10-13 ENCOUNTER — Encounter: Payer: Self-pay | Admitting: Family Medicine

## 2017-10-13 VITALS — BP 110/64 | HR 77 | Temp 98.5°F | Ht 62.0 in | Wt 90.0 lb

## 2017-10-13 DIAGNOSIS — J069 Acute upper respiratory infection, unspecified: Secondary | ICD-10-CM | POA: Diagnosis not present

## 2017-10-13 MED ORDER — AZITHROMYCIN 250 MG PO TABS: ORAL_TABLET | ORAL | 0 refills | Status: DC

## 2017-10-13 NOTE — Assessment & Plan Note (Signed)
Likely viral in nature  - printed azithro  - counseled on supportive care - given indications to follow up.

## 2017-10-13 NOTE — Patient Instructions (Signed)
Please try things such as zyrtec-D or allegra-D which is an antihistamine and decongestant.   Please try afrin which will help with nasal congestion but use for only three days.   Please also try using a netti pot on a regular occasion.  Honey can help with a sore throat.   Vick's and Delsym can help with cough.

## 2017-10-13 NOTE — Progress Notes (Signed)
Jennifer Stephenson - 67 y.o. female MRN [id number]  Date of birth: [birth year]  SUBJECTIVE:  Including CC & ROS.  Chief Complaint  Patient presents with  . Cough    Jennifer Stephenson is a 67 y.o. female that is presenting with a cough/congestion. Ongoing for five days.  Denies fevers or chills. She has been around her grandson with similar symptoms. Denies productive cough. She has been taking Robitussin DM and benadryl with no improvement.     Review of Systems  Constitutional: Negative for fever.  HENT: Positive for congestion and sinus pressure.   Respiratory: Positive for cough.     HISTORY: Past Medical, Surgical, Social, and Family History Reviewed & Updated per EMR.   Pertinent Historical Findings include:  Past Medical History:  Diagnosis Date  . Adenomatous colon polyp 2007 & 2010  . Allergy    seasonal  . Anemia    PMH of   . DDD (degenerative disc disease), cervical   . Dysphagia   . Endometriosis   . Fibrocystic breast disease   . Fracture of wrist, closed    bilaterally age 33 (  fell skating )  . GERD (gastroesophageal reflux disease)   . HTN (hypertension)   . Hyperlipidemia   . Interstitial cystitis   . Osteopenia    [place] DEXA  . Syncope 2007   seen in ER ; Dx: dehydration    Past Surgical History:  Procedure Laterality Date  . ABDOMINAL HYSTERECTOMY  1996   & BSO FOR ENDOMETRIOSIS  . CERVICAL FUSION     X3:.2001 C4-6, 2007 C6-7,2012C3-4 (?)  . COLONOSCOPY  2013   negative  . COLONOSCOPY W/ POLYPECTOMY  2007 & 2011   ADENOMATOUS polyp, internal hemorrhoids  . EGD WITH ESOPHAGEAL DILATION  05/15/2009,07/03/11  . ESOPHAGEAL MANOMETRY  10/27/2011   Procedure: ESOPHAGEAL MANOMETRY (EM);  Surgeon: Gatha Mayer, MD;  Location: [facility];  Service: Endoscopy;  Laterality: N/A;  . SEPTOPLASTY    . TUBAL LIGATION      Allergies  Allergen Reactions  . Bupropion Hcl     rash  . Paroxetine     rash  . Pentazocine Lactate    ([place])hospitalized due to mental status changes  . Ranitidine Hives  . Lipitor [Atorvastatin]     09/05/13 leg pain as per phone call  . Pravastatin     Leg pain  . Prednisone     Nausea & cramps  . Sulfonamide Derivatives     nausea    Family History  Problem Relation Age of Onset  . Dementia Mother   . Ulcers Sister   . Stroke Sister         X 2; both > 65  . Ulcers Brother   . Diabetes Paternal Uncle   . Cancer Maternal Grandmother        CNS  . Heart attack Brother         in 44s  . Cancer Brother        PANCREATIC   . Diabetes Brother        X 2  . Stroke Brother        X2 , both > 55     Social History   Socioeconomic History  . Marital status: Married    Spouse name: Not on file  . Number of children: 2  . Years of education: Not on file  . Highest education level: Not on file  Occupational History  . Occupation: DISABLED  Social Needs  . Financial resource strain: Not on file  . Food insecurity:    Worry: Not on file    Inability: Not on file  . Transportation needs:    Medical: Not on file    Non-medical: Not on file  Tobacco Use  . Smoking status: Current Every Day Smoker    Packs/day: 0.50  . Smokeless tobacco: Never Used  . Tobacco comment: smoked 1967- present, up to 1 ppd; now 1/2-3/4 ppd  Substance and Sexual Activity  . Alcohol use: No  . Drug use: No  . Sexual activity: Not on file  Lifestyle  . Physical activity:    Days per week: Not on file    Minutes per session: Not on file  . Stress: Not on file  Relationships  . Social connections:    Talks on phone: Not on file    Gets together: Not on file    Attends religious service: Not on file    Active member of club or organization: Not on file    Attends meetings of clubs or organizations: Not on file    Relationship status: Not on file  . Intimate partner violence:    Fear of current or ex partner: Not on file    Emotionally abused: Not on file    Physically abused: Not on  file    Forced sexual activity: Not on file  Other Topics Concern  . Not on file  Social History Narrative    CAFFEINE 3 CUPS DAILY     PHYSICAL EXAM:  VS: BP 110/64 (BP Location: Left Arm, Patient Position: Sitting, Cuff Size: Normal)   Pulse 77   Temp 98.5 F (36.9 C) (Oral)   Ht 5\' 2"  (1.575 m)   Wt 90 lb (40.8 kg)   SpO2 97%   BMI 16.46 kg/m  Physical Exam Gen: NAD, alert, cooperative with exam,  ENT: normal lips, normal nasal mucosa, tympanic membranes clear and intact bilaterally, normal oropharynx, no cervical lymphadenopathy Eye: normal EOM, normal conjunctiva and lids CV:  no edema, +2 pedal pulses, regular rate and rhythm, S1-S2   Resp: no accessory muscle use, non-labored, clear to auscultation bilaterally, no crackles or wheezes Skin: no rashes, no areas of induration  Neuro: normal tone, normal sensation to touch Psych:  normal insight, alert and oriented MSK: Normal gait, normal strength       ASSESSMENT & PLAN:   Upper respiratory tract infection Likely viral in nature  - printed azithro  - counseled on supportive care - given indications to follow up.

## 2017-11-10 DIAGNOSIS — H524 Presbyopia: Secondary | ICD-10-CM | POA: Diagnosis not present

## 2017-11-11 ENCOUNTER — Other Ambulatory Visit: Payer: Self-pay | Admitting: Internal Medicine

## 2017-11-11 DIAGNOSIS — F411 Generalized anxiety disorder: Secondary | ICD-10-CM

## 2017-11-11 NOTE — Telephone Encounter (Signed)
[place] Controlled Substance Database checked. Last filled on 05/18/18

## 2017-11-18 DIAGNOSIS — G8929 Other chronic pain: Secondary | ICD-10-CM | POA: Diagnosis not present

## 2017-11-18 DIAGNOSIS — Z720 Tobacco use: Secondary | ICD-10-CM | POA: Diagnosis not present

## 2017-11-18 DIAGNOSIS — Z809 Family history of malignant neoplasm, unspecified: Secondary | ICD-10-CM | POA: Diagnosis not present

## 2017-11-18 DIAGNOSIS — R69 Illness, unspecified: Secondary | ICD-10-CM | POA: Diagnosis not present

## 2017-11-18 DIAGNOSIS — K08109 Complete loss of teeth, unspecified cause, unspecified class: Secondary | ICD-10-CM | POA: Diagnosis not present

## 2017-11-18 DIAGNOSIS — R636 Underweight: Secondary | ICD-10-CM | POA: Diagnosis not present

## 2017-11-18 DIAGNOSIS — Z681 Body mass index (BMI) 19 or less, adult: Secondary | ICD-10-CM | POA: Diagnosis not present

## 2017-11-18 DIAGNOSIS — R32 Unspecified urinary incontinence: Secondary | ICD-10-CM | POA: Diagnosis not present

## 2017-11-18 DIAGNOSIS — I10 Essential (primary) hypertension: Secondary | ICD-10-CM | POA: Diagnosis not present

## 2017-11-18 DIAGNOSIS — K219 Gastro-esophageal reflux disease without esophagitis: Secondary | ICD-10-CM | POA: Diagnosis not present

## 2018-03-12 ENCOUNTER — Encounter: Payer: Self-pay | Admitting: Podiatry

## 2018-03-12 ENCOUNTER — Ambulatory Visit: Payer: Medicare HMO | Admitting: Podiatry

## 2018-03-12 DIAGNOSIS — L600 Ingrowing nail: Secondary | ICD-10-CM | POA: Diagnosis not present

## 2018-03-12 NOTE — Progress Notes (Signed)
Subjective:   Patient ID: Jennifer Stephenson, female   DOB: 67 y.o.   MRN: [id number]   HPI Patient presents with severe nail disease of the hallux bilateral states that they are both sore and she wants them removed permanently and she is tried to trim them try to soak them and padding without relief of symptoms.  Patient is smoking 1/2 pack/day and likes to be active but is in reasonable good health   Review of Systems  All other systems reviewed and are negative.       Objective:  Physical Exam  Constitutional: She appears well-developed and well-nourished.  Cardiovascular: Intact distal pulses.  Pulmonary/Chest: Effort normal.  Musculoskeletal: Normal range of motion.  Neurological: She is alert.  Skin: Skin is warm.  Nursing note and vitals reviewed.   Neurovascular status intact muscle strength adequate range of motion within normal limits with patient noted to have thick dystrophic very painful hallux nails bilateral that are damaged and loose and also painful in the corners with no redness no drainage noted.  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  I explained procedure and risk and patient wants surgery and signed consent form today infiltrated each hallux 60 mg like Marcaine mixture remove the hallux nails under sterile conditions exposed matrix and applied phenol 3 applications 40 seconds followed by alcohol lavage and sterile dressing.  Gave instructions on soaks reappoint and also encouraged to call with any questions concerns

## 2018-03-12 NOTE — Patient Instructions (Signed)

## 2018-03-12 NOTE — Progress Notes (Signed)
   Subjective:    Patient ID: Jennifer Stephenson, female    DOB: 09-Mar-1951, 66 y.o.   MRN: [id number]  HPI    Review of Systems  All other systems reviewed and are negative.      Objective:   Physical Exam        Assessment & Plan:

## 2018-03-15 ENCOUNTER — Ambulatory Visit (INDEPENDENT_AMBULATORY_CARE_PROVIDER_SITE_OTHER): Payer: Medicare HMO

## 2018-03-15 ENCOUNTER — Ambulatory Visit (HOSPITAL_COMMUNITY)
Admission: EM | Admit: 2018-03-15 | Discharge: 2018-03-15 | Disposition: A | Discharge status: Home / Self Care | Payer: Medicare HMO | Attending: Family Medicine | Admitting: Family Medicine

## 2018-03-15 ENCOUNTER — Encounter (HOSPITAL_COMMUNITY): Payer: Self-pay

## 2018-03-15 DIAGNOSIS — S63501A Unspecified sprain of right wrist, initial encounter: Secondary | ICD-10-CM | POA: Diagnosis not present

## 2018-03-15 DIAGNOSIS — W228XXA Striking against or struck by other objects, initial encounter: Secondary | ICD-10-CM | POA: Diagnosis not present

## 2018-03-15 DIAGNOSIS — S6991XA Unspecified injury of right wrist, hand and finger(s), initial encounter: Secondary | ICD-10-CM | POA: Diagnosis not present

## 2018-03-15 DIAGNOSIS — M25531 Pain in right wrist: Secondary | ICD-10-CM | POA: Diagnosis not present

## 2018-03-15 NOTE — ED Triage Notes (Signed)
Pt presents with swelling, pain, discoloration and deformity on right wrist after slamming I against a door pane.

## 2018-03-15 NOTE — ED Provider Notes (Signed)
[place]    CSN: [id number] Arrival date & time: 03/15/18  1549     History   Chief Complaint Chief Complaint  Patient presents with  . Wrist Pain    Right     HPI Jennifer Stephenson is a 67 y.o. female history of osteoporosis, hypertension, hyperlipidemia presenting today for a wrist injury.  Patient was hurrying to the bathroom and accidentally slammed her right wrist into the door frame.  After she developed bruising as well as noticed swelling to her wrist.  Endorses pain with certain movements of her wrist.  Incident happened yesterday evening.  Has not taken anything for pain.  HPI  Past Medical History:  Diagnosis Date  . Adenomatous colon polyp 2007 & 2010  . Allergy    seasonal  . Anemia    PMH of   . DDD (degenerative disc disease), cervical   . Dysphagia   . Endometriosis   . Fibrocystic breast disease   . Fracture of wrist, closed    bilaterally age 35 (  fell skating )  . GERD (gastroesophageal reflux disease)   . HTN (hypertension)   . Hyperlipidemia   . Interstitial cystitis   . Osteopenia    [place] DEXA  . Syncope 2007   seen in ER ; Dx: dehydration    Patient Active Problem List   Diagnosis Date Noted  . Upper respiratory tract infection 10/13/2017  . Reactive airway disease 05/18/2017  . Generalized anxiety disorder 05/18/2017  . Dizziness 04/28/2016  . Osteoporosis 10/04/2015  . Elevated hematocrit 06/10/2014  . GERD (gastroesophageal reflux disease) 10/25/2013  . Abnormal EKG 06/01/2012  . COLONIC POLYPS, HX OF 09/19/2008  . THYROID NODULE 03/28/2008  . Hyperlipidemia 03/07/2008  . CIGARETTE SMOKER 03/07/2008  . Esophageal dysphagia 03/07/2008  . INTERSTITIAL CYSTITIS 07/29/2007  . Essential hypertension 05/27/2007    Past Surgical History:  Procedure Laterality Date  . ABDOMINAL HYSTERECTOMY  1996   & BSO FOR ENDOMETRIOSIS  . CERVICAL FUSION     X3:.2001 C4-6, 2007 C6-7,2012C3-4 (?)  . COLONOSCOPY  2013   negative  . COLONOSCOPY W/ POLYPECTOMY  2007 & 2011   ADENOMATOUS polyp, internal hemorrhoids  . EGD WITH ESOPHAGEAL DILATION  05/15/2009,07/03/11  . ESOPHAGEAL MANOMETRY  10/27/2011   Procedure: ESOPHAGEAL MANOMETRY (EM);  Surgeon: Gatha Mayer, MD;  Location: [facility];  Service: Endoscopy;  Laterality: N/A;  . SEPTOPLASTY    . TUBAL LIGATION      OB History   None      Home Medications    Prior to Admission medications   Medication Sig Start Date End Date Taking? Authorizing Provider  albuterol (PROVENTIL HFA;VENTOLIN HFA) 108 (90 Base) MCG/ACT inhaler Inhale 2 puffs every 6 (six) hours as needed into the lungs for wheezing or shortness of breath. 05/18/17   Binnie Rail, MD  azithromycin (ZITHROMAX) 250 MG tablet Take 500 mg the first day then 250 mg the next 4 days. Total of 5 days. 10/13/17   Rosemarie Ax, MD  Calcium-Vitamin D [phone] MG-UNIT TABS Take by mouth daily. Take 2     [provider]  cholecalciferol (VITAMIN D) 400 UNITS TABS Take by mouth daily.      [provider]  diltiazem (TIAZAC) 180 MG 24 hr capsule Take 1 capsule (180 mg total) daily by mouth. 05/18/17   Binnie Rail, MD  hydrOXYzine (ATARAX/VISTARIL) 25 MG tablet Take 1 tablet (25 mg total) by mouth every 8 (eight)  hours as needed for itching. 05/19/16   Nche, Charlene Brooke, NP  LORazepam (ATIVAN) 1 MG tablet TAKE 1/2 TABLET EVERY 6 TO 8 HOURS AS NEEDED 11/11/17   Binnie Rail, MD  omeprazole (PRILOSEC) 20 MG capsule TAKE 1 CAPSULE BY MOUTH EVERY DAY 10/12/17   Binnie Rail, MD  vitamin C (ASCORBIC ACID) 500 MG tablet Take 500 mg by mouth daily.      [provider]  vitamin E (VITAMIN E) 400 UNIT capsule Take 400 Units by mouth daily.    [provider]    Family History Family History  Problem Relation Age of Onset  . Dementia Mother   . Ulcers Sister   . Stroke Sister         X 2; both > 65  . Ulcers Brother   . Diabetes Paternal Uncle   . Cancer  Maternal Grandmother        CNS  . Heart attack Brother         in 14s  . Cancer Brother        PANCREATIC   . Diabetes Brother        X 2  . Stroke Brother        X2 , both > 49    Social History Social History   Tobacco Use  . Smoking status: Current Every Day Smoker    Packs/day: 0.50  . Smokeless tobacco: Never Used  . Tobacco comment: smoked 1967- present, up to 1 ppd; now 1/2-3/4 ppd  Substance Use Topics  . Alcohol use: No  . Drug use: No     Allergies   Bupropion hcl; Paroxetine; Pentazocine lactate; Ranitidine; Lipitor [atorvastatin]; Pravastatin; Prednisone; and Sulfonamide derivatives   Review of Systems Review of Systems  Constitutional: Negative for fatigue and fever.  Eyes: Negative for visual disturbance.  Respiratory: Negative for shortness of breath.   Cardiovascular: Negative for chest pain.  Gastrointestinal: Negative for abdominal pain, nausea and vomiting.  Musculoskeletal: Positive for arthralgias and joint swelling.  Skin: Positive for color change. Negative for rash and wound.  Neurological: Negative for dizziness, weakness, light-headedness and headaches.     Physical Exam Triage Vital Signs ED Triage Vitals  Enc Vitals Group     BP 03/15/18 1632 122/63     Pulse Rate 03/15/18 1632 64     Resp 03/15/18 1632 19     Temp 03/15/18 1632 98.8 F (37.1 C)     Temp Source 03/15/18 1632 Oral     SpO2 03/15/18 1632 98 %     Weight --      Height --      Head Circumference --      Peak Flow --      Pain Score 03/15/18 1631 3     Pain Loc --      Pain Edu? --      Excl. in [place]? --    No data found.  Updated Vital Signs BP 122/63 (BP Location: Left Arm)   Pulse 64   Temp 98.8 F (37.1 C) (Oral)   Resp 19   SpO2 98%   Visual Acuity Right Eye Distance:   Left Eye Distance:   Bilateral Distance:    Right Eye Near:   Left Eye Near:    Bilateral Near:     Physical Exam  Constitutional: She is oriented to person, place, and  time. She appears well-developed and well-nourished.  No acute distress  HENT:  Head:  Normocephalic and atraumatic.  Nose: Nose normal.  Eyes: Conjunctivae are normal.  Neck: Neck supple.  Cardiovascular: Normal rate.  Pulmonary/Chest: Effort normal. No respiratory distress.  Abdominal: She exhibits no distension.  Musculoskeletal: Normal range of motion.  Right wrist: Bruising overlying the distal ulna, distal end of ulna larger and more prominent compared to left  Neurological: She is alert and oriented to person, place, and time.  Skin: Skin is warm and dry.  Psychiatric: She has a normal mood and affect.  Nursing note and vitals reviewed.    UC Treatments / Results  Labs (all labs ordered are listed, but only abnormal results are displayed) Labs Reviewed - No data to display  EKG None  Radiology Dg Wrist Complete Right  Result Date: 03/15/2018 CLINICAL DATA:  Hit wrist on door frame pain on the lateral side EXAM: RIGHT WRIST - COMPLETE 3+ VIEW COMPARISON:  None available FINDINGS: Mild degenerative change at the first [facility] joint. No fracture or malalignment. IMPRESSION: No acute osseous abnormality. Electronically Signed   By: Donavan Foil M.D.   On: 03/15/2018 17:06    Procedures Procedures (including critical care time)  Medications Ordered in UC Medications - No data to display  Initial Impression / Assessment and Plan / UC Course  I have reviewed the triage vital signs and the nursing notes.  Pertinent labs & imaging results that were available during my care of the patient were reviewed by me and considered in my medical decision making (see chart for details).     No fracture seen on x-ray, likely contusion versus sprain.  Recommend conservative treatment with wrist brace, anti-inflammatories, ice and rest.  Advised to return if developing increased worsening pain, pain not improving with treatment in approximately 1 to 2 weeks, and developing numbness or  tingling.Discussed strict return precautions. Patient verbalized understanding and is agreeable with plan.  Final Clinical Impressions(s) / UC Diagnoses   Final diagnoses:  Sprain of right wrist, initial encounter     Discharge Instructions     Use anti-inflammatories for pain/swelling. You may take up to 400-600 mg Ibuprofen every 8 hours with food. You may supplement Ibuprofen with Tylenol [phone] mg every 8 hours.   Ice and rest wrist  Wrist brace for support to prevent extreme motions    ED Prescriptions    None     Controlled Substance Prescriptions [place] Controlled Substance Registry consulted? Not Applicable   Janith Lima, [place] 03/15/18 1711

## 2018-03-15 NOTE — Discharge Instructions (Signed)
Use anti-inflammatories for pain/swelling. You may take up to 400-600 mg Ibuprofen every 8 hours with food. You may supplement Ibuprofen with Tylenol [phone] mg every 8 hours.   Ice and rest wrist  Wrist brace for support to prevent extreme motions

## 2018-04-01 ENCOUNTER — Other Ambulatory Visit: Payer: Self-pay | Admitting: Internal Medicine

## 2018-04-15 DIAGNOSIS — Z23 Encounter for immunization: Secondary | ICD-10-CM | POA: Diagnosis not present

## 2018-04-16 ENCOUNTER — Other Ambulatory Visit: Payer: Self-pay | Admitting: Internal Medicine

## 2018-04-16 DIAGNOSIS — F411 Generalized anxiety disorder: Secondary | ICD-10-CM

## 2018-04-16 NOTE — Telephone Encounter (Signed)
Check [place] registry last filled 11/11/2017../lmb  

## 2018-06-02 NOTE — Progress Notes (Signed)
Subjective:    Patient ID: Jennifer Stephenson, female    DOB: Nov 10, 1950, 67 y.o.   MRN: [id number]  HPI She is here for a physical exam.   She denies any changes in her health and has no major health concerns.  Her medication is working well and she does need refills today.     Medications and allergies reviewed with patient and updated if appropriate.  Patient Active Problem List   Diagnosis Date Noted  . Reactive airway disease 05/18/2017  . Generalized anxiety disorder 05/18/2017  . Dizziness 04/28/2016  . Osteoporosis 10/04/2015  . Elevated hematocrit 06/10/2014  . GERD (gastroesophageal reflux disease) 10/25/2013  . Abnormal EKG 06/01/2012  . COLONIC POLYPS, HX OF 09/19/2008  . THYROID NODULE 03/28/2008  . Hyperlipidemia 03/07/2008  . Tobacco abuse 03/07/2008  . Esophageal dysphagia 03/07/2008  . INTERSTITIAL CYSTITIS 07/29/2007  . Essential hypertension 05/27/2007    Current Outpatient Medications on File Prior to Visit  Medication Sig Dispense Refill  . albuterol (PROVENTIL HFA;VENTOLIN HFA) 108 (90 Base) MCG/ACT inhaler Inhale 2 puffs every 6 (six) hours as needed into the lungs for wheezing or shortness of breath. 1 Inhaler 2  . Calcium-Vitamin D 600-125 MG-UNIT TABS Take by mouth daily. Take 2     . cholecalciferol (VITAMIN D) 400 UNITS TABS Take by mouth daily.      Marland Kitchen diltiazem (TIAZAC) 180 MG 24 hr capsule Take 1 capsule (180 mg total) daily by mouth. 90 capsule 3  . LORazepam (ATIVAN) 1 MG tablet TAKE 1/2 TABLET EVERY 6 TO 8 HOURS AS NEEDED 30 tablet 0  . omeprazole (PRILOSEC) 20 MG capsule Take 1 capsule (20 mg total) by mouth daily. -- Office visit needed for further refills 90 capsule 0  . vitamin C (ASCORBIC ACID) 500 MG tablet Take 500 mg by mouth daily.      . vitamin E (VITAMIN E) 400 UNIT capsule Take 400 Units by mouth daily.     No current facility-administered medications on file prior to visit.     Past Medical History:  Diagnosis Date  .  Adenomatous colon polyp 2007 & 2010  . Allergy    seasonal  . Anemia    PMH of   . DDD (degenerative disc disease), cervical   . Dysphagia   . Endometriosis   . Fibrocystic breast disease   . Fracture of wrist, closed    bilaterally age 67 (  fell skating )  . GERD (gastroesophageal reflux disease)   . HTN (hypertension)   . Hyperlipidemia   . Interstitial cystitis   . Osteopenia    [place] DEXA  . Syncope 2007   seen in ER ; Dx: dehydration    Past Surgical History:  Procedure Laterality Date  . ABDOMINAL HYSTERECTOMY  1996   & BSO FOR ENDOMETRIOSIS  . CERVICAL FUSION     X3:.2001 C4-6, 2007 C6-7,2012C3-4 (?)  . COLONOSCOPY  2013   negative  . COLONOSCOPY W/ POLYPECTOMY  2007 & 2011   ADENOMATOUS polyp, internal hemorrhoids  . EGD WITH ESOPHAGEAL DILATION  05/15/2009,07/03/11  . ESOPHAGEAL MANOMETRY  10/27/2011   Procedure: ESOPHAGEAL MANOMETRY (EM);  Surgeon: Gatha Mayer, MD;  Location: [facility];  Service: Endoscopy;  Laterality: N/A;  . SEPTOPLASTY    . TUBAL LIGATION      Social History   Socioeconomic History  . Marital status: Married    Spouse name: Not on file  . Number of children: 2  .  Years of education: Not on file  . Highest education level: Not on file  Occupational History  . Occupation: DISABLED  Social Needs  . Financial resource strain: Not on file  . Food insecurity:    Worry: Not on file    Inability: Not on file  . Transportation needs:    Medical: Not on file    Non-medical: Not on file  Tobacco Use  . Smoking status: Current Every Day Smoker    Packs/day: 0.50  . Smokeless tobacco: Never Used  . Tobacco comment: smoked 1967- present, up to 1 ppd; now 1/2-3/4 ppd  Substance and Sexual Activity  . Alcohol use: No  . Drug use: No  . Sexual activity: Not on file  Lifestyle  . Physical activity:    Days per week: Not on file    Minutes per session: Not on file  . Stress: Not on file  Relationships  . Social connections:     Talks on phone: Not on file    Gets together: Not on file    Attends religious service: Not on file    Active member of club or organization: Not on file    Attends meetings of clubs or organizations: Not on file    Relationship status: Not on file  Other Topics Concern  . Not on file  Social History Narrative    [employer] 3 CUPS DAILY    Family History  Problem Relation Age of Onset  . Dementia Mother   . Ulcers Sister   . Stroke Sister         X 2; both > 65  . Ulcers Brother   . Diabetes Paternal Uncle   . Cancer Maternal Grandmother        CNS  . Heart attack Brother         in 15s  . Cancer Brother        PANCREATIC   . Diabetes Brother        X 2  . Stroke Brother        X2 , both > 55    Review of Systems  Constitutional: Negative for chills and fever.  Eyes: Negative for visual disturbance.  Respiratory: Negative for cough, shortness of breath and wheezing.   Cardiovascular: Positive for palpitations (occasionally). Negative for chest pain and leg swelling.  Gastrointestinal: Negative for abdominal pain, blood in stool, constipation, diarrhea and nausea.       No gerd  Genitourinary: Negative for dysuria and hematuria.  Musculoskeletal: Positive for arthralgias, back pain and neck pain.  Skin: Negative for color change and rash.  Neurological: Positive for light-headedness (occ) and headaches.  Psychiatric/Behavioral: Negative for dysphoric mood. The patient is nervous/anxious.        Objective:   Vitals:   06/03/18 0822  BP: (!) 142/94  Pulse: 79  Resp: 16  Temp: 98.1 F (36.7 C)  SpO2: 97%   Filed Weights   06/03/18 0822  Weight: 88 lb 1.9 oz (40 kg)   Body mass index is 16.12 kg/m.   BP Readings from Last 3 Encounters:  06/03/18 (!) 142/94  03/15/18 122/63  10/13/17 110/64    Wt Readings from Last 3 Encounters:  06/03/18 88 lb 1.9 oz (40 kg)  10/13/17 90 lb (40.8 kg)  05/18/17 89 lb (40.4 kg)     Physical Exam Constitutional:  She appears well-developed and well-nourished. No distress.  HENT:  Head: Normocephalic and atraumatic.  Right Ear: External ear normal.  Normal ear canal and TM Left Ear: External ear normal.  Normal ear canal and TM Mouth/Throat: Oropharynx is clear and moist.  Eyes: Conjunctivae and EOM are normal.  Neck: Neck supple. No tracheal deviation present. Right side thyroid nodule present.  No carotid bruit  Cardiovascular: Normal rate, regular rhythm and normal heart sounds.   No murmur heard.  No edema. Pulmonary/Chest: Effort normal and breath sounds normal. No respiratory distress. She has no wheezes. She has no rales.  Breast: deferred to Gyn Abdominal: Soft. She exhibits no distension. There is no tenderness.  Lymphadenopathy: She has no cervical adenopathy.  Skin: Skin is warm and dry. She is not diaphoretic.  Psychiatric: She has a normal mood and affect. Her behavior is normal.        Assessment & Plan:   Physical exam: Screening blood work  ordered Immunizations  Flu vaccine up to date,  tdap due,  shingrix discussed Colonoscopy    Up to date - due 10/2018 Mammogram   Up to date  Gyn    Up to date  Dexa     Due this month     05/2018 Eye exams  Up to date  EKG  Done 05/2013 Exercise   Walks regularly Weight - BMI on low side Skin  No concerns Substance abuse  Smoker  - does not think she is ready to quit, no alcohol abuse    See Problem List for Assessment and Plan of chronic medical problems.    FU in one year

## 2018-06-02 NOTE — Patient Instructions (Addendum)
Tests ordered today. Your results will be released to [facility] (or called to you) after review, usually within 72hours after test completion. If any changes need to be made, you will be notified at that same time.   Medications reviewed and updated.  Changes include :   none  Your prescription(s) have been submitted to your pharmacy. Please take as directed and contact our office if you believe you are having problem(s) with the medication(s).  Please followup in one year   Health Maintenance, Female Adopting a healthy lifestyle and getting preventive care can go a long way to promote health and wellness. Talk with your health care provider about what schedule of regular examinations is right for you. This is a good chance for you to check in with your provider about disease prevention and staying healthy. In between checkups, there are plenty of things you can do on your own. Experts have done a lot of research about which lifestyle changes and preventive measures are most likely to keep you healthy. Ask your health care provider for more information. Weight and diet Eat a healthy diet  Be sure to include plenty of vegetables, fruits, low-fat dairy products, and lean protein.  Do not eat a lot of foods high in solid fats, added sugars, or salt.  Get regular exercise. This is one of the most important things you can do for your health. ? Most adults should exercise for at least 150 minutes each week. The exercise should increase your heart rate and make you sweat (moderate-intensity exercise). ? Most adults should also do strengthening exercises at least twice a week. This is in addition to the moderate-intensity exercise.  Maintain a healthy weight  Body mass index (BMI) is a measurement that can be used to identify possible weight problems. It estimates body fat based on height and weight. Your health care provider can help determine your BMI and help you achieve or maintain a healthy  weight.  For females 68 years of age and older: ? A BMI below 18.5 is considered underweight. ? A BMI of 18.5 to 24.9 is normal. ? A BMI of 25 to 29.9 is considered overweight. ? A BMI of 30 and above is considered obese.  Watch levels of cholesterol and blood lipids  You should start having your blood tested for lipids and cholesterol at 68 years of age, then have this test every 5 years.  You may need to have your cholesterol levels checked more often if: ? Your lipid or cholesterol levels are high. ? You are older than 68 years of age. ? You are at high risk for heart disease.  Cancer screening Lung Cancer  Lung cancer screening is recommended for adults 43-1 years old who are at high risk for lung cancer because of a history of smoking.  A yearly low-dose CT scan of the lungs is recommended for people who: ? Currently smoke. ? Have quit within the past 15 years. ? Have at least a 30-pack-year history of smoking. A pack year is smoking an average of one pack of cigarettes a day for 1 year.  Yearly screening should continue until it has been 15 years since you quit.  Yearly screening should stop if you develop a health problem that would prevent you from having lung cancer treatment.  Breast Cancer  Practice breast self-awareness. This means understanding how your breasts normally appear and feel.  It also means doing regular breast self-exams. Let your health care provider know about  any changes, no matter how small.  If you are in your 20s or 30s, you should have a clinical breast exam (CBE) by a health care provider every 1-3 years as part of a regular health exam.  If you are 64 or older, have a CBE every year. Also consider having a breast X-ray (mammogram) every year.  If you have a family history of breast cancer, talk to your health care provider about genetic screening.  If you are at high risk for breast cancer, talk to your health care provider about having an  MRI and a mammogram every year.  Breast cancer gene (BRCA) assessment is recommended for women who have family members with BRCA-related cancers. BRCA-related cancers include: ? Breast. ? Ovarian. ? Tubal. ? Peritoneal cancers.  Results of the assessment will determine the need for genetic counseling and BRCA1 and BRCA2 testing.  Cervical Cancer Your health care provider may recommend that you be screened regularly for cancer of the pelvic organs (ovaries, uterus, and vagina). This screening involves a pelvic examination, including checking for microscopic changes to the surface of your cervix (Pap test). You may be encouraged to have this screening done every 3 years, beginning at age 54.  For women ages 55-65, health care providers may recommend pelvic exams and Pap testing every 3 years, or they may recommend the Pap and pelvic exam, combined with testing for human papilloma virus (HPV), every 5 years. Some types of HPV increase your risk of cervical cancer. Testing for HPV may also be done on women of any age with unclear Pap test results.  Other health care providers may not recommend any screening for nonpregnant women who are considered low risk for pelvic cancer and who do not have symptoms. Ask your health care provider if a screening pelvic exam is right for you.  If you have had past treatment for cervical cancer or a condition that could lead to cancer, you need Pap tests and screening for cancer for at least 20 years after your treatment. If Pap tests have been discontinued, your risk factors (such as having a new sexual partner) need to be reassessed to determine if screening should resume. Some women have medical problems that increase the chance of getting cervical cancer. In these cases, your health care provider may recommend more frequent screening and Pap tests.  Colorectal Cancer  This type of cancer can be detected and often prevented.  Routine colorectal cancer screening  usually begins at 68 years of age and continues through 68 years of age.  Your health care provider may recommend screening at an earlier age if you have risk factors for colon cancer.  Your health care provider may also recommend using home test kits to check for hidden blood in the stool.  A small camera at the end of a tube can be used to examine your colon directly (sigmoidoscopy or colonoscopy). This is done to check for the earliest forms of colorectal cancer.  Routine screening usually begins at age 53.  Direct examination of the colon should be repeated every 5-10 years through 68 years of age. However, you may need to be screened more often if early forms of precancerous polyps or small growths are found.  Skin Cancer  Check your skin from head to toe regularly.  Tell your health care provider about any new moles or changes in moles, especially if there is a change in a mole's shape or color.  Also tell your health care provider  if you have a mole that is larger than the size of a pencil eraser.  Always use sunscreen. Apply sunscreen liberally and repeatedly throughout the day.  Protect yourself by wearing long sleeves, pants, a wide-brimmed hat, and sunglasses whenever you are outside.  Heart disease, diabetes, and high blood pressure  High blood pressure causes heart disease and increases the risk of stroke. High blood pressure is more likely to develop in: ? People who have blood pressure in the high end of the normal range (130-139/85-89 mm Hg). ? People who are overweight or obese. ? People who are African American.  If you are 38-101 years of age, have your blood pressure checked every 3-5 years. If you are 72 years of age or older, have your blood pressure checked every year. You should have your blood pressure measured twice-once when you are at a hospital or clinic, and once when you are not at a hospital or clinic. Record the average of the two measurements. To check  your blood pressure when you are not at a hospital or clinic, you can use: ? An automated blood pressure machine at a pharmacy. ? A home blood pressure monitor.  If you are between 52 years and 64 years old, ask your health care provider if you should take aspirin to prevent strokes.  Have regular diabetes screenings. This involves taking a blood sample to check your fasting blood sugar level. ? If you are at a normal weight and have a low risk for diabetes, have this test once every three years after 68 years of age. ? If you are overweight and have a high risk for diabetes, consider being tested at a younger age or more often. Preventing infection Hepatitis B  If you have a higher risk for hepatitis B, you should be screened for this virus. You are considered at high risk for hepatitis B if: ? You were born in a country where hepatitis B is common. Ask your health care provider which countries are considered high risk. ? Your parents were born in a high-risk country, and you have not been immunized against hepatitis B (hepatitis B vaccine). ? You have HIV or AIDS. ? You use needles to inject street drugs. ? You live with someone who has hepatitis B. ? You have had sex with someone who has hepatitis B. ? You get hemodialysis treatment. ? You take certain medicines for conditions, including cancer, organ transplantation, and autoimmune conditions.  Hepatitis C  Blood testing is recommended for: ? Everyone born from [birth year] through 1965. ? Anyone with known risk factors for hepatitis C.  Sexually transmitted infections (STIs)  You should be screened for sexually transmitted infections (STIs) including gonorrhea and chlamydia if: ? You are sexually active and are younger than 68 years of age. ? You are older than 68 years of age and your health care provider tells you that you are at risk for this type of infection. ? Your sexual activity has changed since you were last screened and you  are at an increased risk for chlamydia or gonorrhea. Ask your health care provider if you are at risk.  If you do not have HIV, but are at risk, it may be recommended that you take a prescription medicine daily to prevent HIV infection. This is called pre-exposure prophylaxis (PrEP). You are considered at risk if: ? You are sexually active and do not regularly use condoms or know the HIV status of your partner(s). ? You take drugs  by injection. ? You are sexually active with a partner who has HIV.  Talk with your health care provider about whether you are at high risk of being infected with HIV. If you choose to begin PrEP, you should first be tested for HIV. You should then be tested every 3 months for as long as you are taking PrEP. Pregnancy  If you are premenopausal and you may become pregnant, ask your health care provider about preconception counseling.  If you may become pregnant, take 400 to 800 micrograms (mcg) of folic acid every day.  If you want to prevent pregnancy, talk to your health care provider about birth control (contraception). Osteoporosis and menopause  Osteoporosis is a disease in which the bones lose minerals and strength with aging. This can result in serious bone fractures. Your risk for osteoporosis can be identified using a bone density scan.  If you are 29 years of age or older, or if you are at risk for osteoporosis and fractures, ask your health care provider if you should be screened.  Ask your health care provider whether you should take a calcium or vitamin D supplement to lower your risk for osteoporosis.  Menopause may have certain physical symptoms and risks.  Hormone replacement therapy may reduce some of these symptoms and risks. Talk to your health care provider about whether hormone replacement therapy is right for you. Follow these instructions at home:  Schedule regular health, dental, and eye exams.  Stay current with your  immunizations.  Do not use any tobacco products including cigarettes, chewing tobacco, or electronic cigarettes.  If you are pregnant, do not drink alcohol.  If you are breastfeeding, limit how much and how often you drink alcohol.  Limit alcohol intake to no more than 1 drink per day for nonpregnant women. One drink equals 12 ounces of beer, 5 ounces of wine, or 1 ounces of hard liquor.  Do not use street drugs.  Do not share needles.  Ask your health care provider for help if you need support or information about quitting drugs.  Tell your health care provider if you often feel depressed.  Tell your health care provider if you have ever been abused or do not feel safe at home. This information is not intended to replace advice given to you by your health care provider. Make sure you discuss any questions you have with your health care provider. Document Released: 01/13/2011 Document Revised: 12/06/2015 Document Reviewed: 04/03/2015 Elsevier Interactive Patient Education  [employer].

## 2018-06-03 ENCOUNTER — Encounter: Payer: Self-pay | Admitting: Internal Medicine

## 2018-06-03 ENCOUNTER — Other Ambulatory Visit (INDEPENDENT_AMBULATORY_CARE_PROVIDER_SITE_OTHER): Payer: Medicare HMO

## 2018-06-03 ENCOUNTER — Ambulatory Visit (INDEPENDENT_AMBULATORY_CARE_PROVIDER_SITE_OTHER): Payer: Medicare HMO | Admitting: *Deleted

## 2018-06-03 ENCOUNTER — Ambulatory Visit (INDEPENDENT_AMBULATORY_CARE_PROVIDER_SITE_OTHER): Payer: Medicare HMO | Admitting: Internal Medicine

## 2018-06-03 VITALS — BP 142/94 | HR 79 | Temp 98.1°F | Resp 16 | Ht 62.0 in | Wt 88.1 lb

## 2018-06-03 VITALS — BP 142/94 | HR 79 | Resp 16 | Ht 62.0 in | Wt 88.0 lb

## 2018-06-03 DIAGNOSIS — M810 Age-related osteoporosis without current pathological fracture: Secondary | ICD-10-CM

## 2018-06-03 DIAGNOSIS — Z0000 Encounter for general adult medical examination without abnormal findings: Secondary | ICD-10-CM

## 2018-06-03 DIAGNOSIS — E041 Nontoxic single thyroid nodule: Secondary | ICD-10-CM | POA: Diagnosis not present

## 2018-06-03 DIAGNOSIS — F411 Generalized anxiety disorder: Secondary | ICD-10-CM

## 2018-06-03 DIAGNOSIS — I10 Essential (primary) hypertension: Secondary | ICD-10-CM

## 2018-06-03 DIAGNOSIS — Z720 Tobacco use: Secondary | ICD-10-CM | POA: Diagnosis not present

## 2018-06-03 DIAGNOSIS — J4520 Mild intermittent asthma, uncomplicated: Secondary | ICD-10-CM

## 2018-06-03 DIAGNOSIS — R69 Illness, unspecified: Secondary | ICD-10-CM | POA: Diagnosis not present

## 2018-06-03 DIAGNOSIS — Z0001 Encounter for general adult medical examination with abnormal findings: Secondary | ICD-10-CM

## 2018-06-03 DIAGNOSIS — K219 Gastro-esophageal reflux disease without esophagitis: Secondary | ICD-10-CM

## 2018-06-03 LAB — COMPREHENSIVE METABOLIC PANEL
ALBUMIN: 4.6 g/dL (ref 3.5–5.2)
ALK PHOS: 79 U/L (ref 39–117)
ALT: 14 U/L (ref 0–35)
AST: 16 U/L (ref 0–37)
BUN: 14 mg/dL (ref 6–23)
CALCIUM: 9.7 mg/dL (ref 8.4–10.5)
CO2: 24 mEq/L (ref 19–32)
Chloride: 105 mEq/L (ref 96–112)
Creatinine, Ser: 0.86 mg/dL (ref 0.40–1.20)
GFR: 69.69 mL/min (ref >60.00)
Glucose, Bld: 85 mg/dL (ref 70–99)
POTASSIUM: 4.4 meq/L (ref 3.5–5.1)
SODIUM: 139 meq/L (ref 135–145)
TOTAL PROTEIN: 7.6 g/dL (ref 6.0–8.3)
Total Bilirubin: 0.8 mg/dL (ref 0.2–1.2)

## 2018-06-03 LAB — CBC WITH DIFFERENTIAL/PLATELET
Basophils Absolute: 0.1 10*3/uL (ref 0.0–0.1)
Basophils Relative: 1.2 % (ref 0.0–3.0)
EOS ABS: 0.1 10*3/uL (ref 0.0–0.7)
Eosinophils Relative: 1.8 % (ref 0.0–5.0)
HEMATOCRIT: 46.9 % — AB (ref 36.0–46.0)
HEMOGLOBIN: 16.3 g/dL — AB (ref 12.0–15.0)
LYMPHS PCT: 19.8 % (ref 12.0–46.0)
Lymphs Abs: 1.3 10*3/uL (ref 0.7–4.0)
MCHC: 34.7 g/dL (ref 30.0–36.0)
MCV: 87.7 fl (ref 78.0–100.0)
MONO ABS: 0.4 10*3/uL (ref 0.1–1.0)
Monocytes Relative: 6.2 % (ref 3.0–12.0)
Neutro Abs: 4.5 10*3/uL (ref 1.4–7.7)
Neutrophils Relative %: 71 % (ref 43.0–77.0)
Platelets: 208 10*3/uL (ref 150.0–400.0)
RBC: 5.35 Mil/uL — AB (ref 3.87–5.11)
RDW: 14.2 % (ref 11.5–15.5)
WBC: 6.3 10*3/uL (ref 4.0–10.5)

## 2018-06-03 LAB — LIPID PANEL
CHOLESTEROL: 178 mg/dL (ref 0–200)
HDL: 55.8 mg/dL (ref >39.00)
LDL Cholesterol: 106 mg/dL — ABNORMAL HIGH (ref 0–99)
NonHDL: 122.04
TRIGLYCERIDES: 81 mg/dL (ref 0.0–149.0)
Total CHOL/HDL Ratio: 3
VLDL: 16.2 mg/dL (ref 0.0–40.0)

## 2018-06-03 LAB — TSH: TSH: 1.2 u[IU]/mL (ref 0.35–4.50)

## 2018-06-03 MED ORDER — OMEPRAZOLE 20 MG PO CPDR: 20.0000 mg | DELAYED_RELEASE_CAPSULE | Freq: Every day | ORAL | 3 refills | Status: DC

## 2018-06-03 MED ORDER — DILTIAZEM HCL ER BEADS 180 MG PO CP24: 180.0000 mg | ORAL_CAPSULE | Freq: Every day | ORAL | 3 refills | Status: DC

## 2018-06-03 MED ORDER — LORAZEPAM 1 MG PO TABS: ORAL_TABLET | ORAL | 0 refills | Status: DC

## 2018-06-03 NOTE — Progress Notes (Addendum)
Subjective:   Jennifer Stephenson is a 67 y.o. female who presents for Medicare Annual (Subsequent) preventive examination.  Review of Systems:  No ROS.  Medicare Wellness Visit. Additional risk factors are reflected in the social history.  Cardiac Risk Factors include: advanced age (>60men, >17 women);dyslipidemia Sleep patterns: gets up 1 times nightly to void and sleeps 6-7 hours nightly.    Home Safety/Smoke Alarms: Feels safe in home. Smoke alarms in place.  Living environment; residence and Firearm Safety: 1-story house/ trailer, no firearms. Lives with husband, no needs for DME, good support system Seat Belt Safety/Bike Helmet: Wears seat belt.     Objective:     Vitals: BP (!) 142/94   Pulse 79   Resp 16   Ht 5\' 2"  (1.575 m)   Wt 88 lb (39.9 kg)   SpO2 97%   BMI 16.10 kg/m   Body mass index is 16.1 kg/m.  Advanced Directives 06/03/2018  Does Patient Have a Medical Advance Directive? No  Would patient like information on creating a medical advance directive? Yes (ED - Information included in AVS)    Tobacco Social History   Tobacco Use  Smoking Status Current Every Day Smoker  . Packs/day: 0.50  Smokeless Tobacco Never Used  Tobacco Comment   smoked 1967- present, up to 1 ppd; now 1/2-3/4 ppd     Ready to quit: No Counseling given: No Comment: smoked 1967- present, up to 1 ppd; now 1/2-3/4 ppd  Past Medical History:  Diagnosis Date  . Adenomatous colon polyp 2007 & 2010  . Allergy    seasonal  . Anemia    PMH of   . DDD (degenerative disc disease), cervical   . Dysphagia   . Endometriosis   . Fibrocystic breast disease   . Fracture of wrist, closed    bilaterally age 32 (  fell skating )  . GERD (gastroesophageal reflux disease)   . HTN (hypertension)   . Hyperlipidemia   . Interstitial cystitis   . Osteopenia     DEXA  . Syncope 2007   seen in ER ; Dx: dehydration   Past Surgical History:  Procedure Laterality Date  . ABDOMINAL  HYSTERECTOMY  1996   & BSO FOR ENDOMETRIOSIS  . CERVICAL FUSION     X3:.2001 C4-6, 2007 C6-7,2012C3-4 (?)  . COLONOSCOPY  2013   negative  . COLONOSCOPY W/ POLYPECTOMY  2007 & 2011   ADENOMATOUS polyp, internal hemorrhoids  . EGD WITH ESOPHAGEAL DILATION  05/15/2009,07/03/11  . ESOPHAGEAL MANOMETRY  10/27/2011   Procedure: ESOPHAGEAL MANOMETRY (EM);  Surgeon: Gatha Mayer, MD;  Location: [facility];  Service: Endoscopy;  Laterality: N/A;  . SEPTOPLASTY    . TUBAL LIGATION     Family History  Problem Relation Age of Onset  . Dementia Mother   . Ulcers Sister   . Stroke Sister         X 2; both > 65  . Ulcers Brother   . Diabetes Paternal Uncle   . Cancer Maternal Grandmother        CNS  . Heart attack Brother         in 17s  . Cancer Brother        PANCREATIC   . Diabetes Brother        X 2  . Stroke Brother        X2 , both > 55   Social History   Socioeconomic History  . Marital status: Married  Spouse name: Not on file  . Number of children: 2  . Years of education: Not on file  . Highest education level: Not on file  Occupational History  . Occupation: DISABLED  Social Needs  . Financial resource strain: Not hard at all  . Food insecurity:    Worry: Never true    Inability: Never true  . Transportation needs:    Medical: No    Non-medical: No  Tobacco Use  . Smoking status: Current Every Day Smoker    Packs/day: 0.50  . Smokeless tobacco: Never Used  . Tobacco comment: smoked 1967- present, up to 1 ppd; now 1/2-3/4 ppd  Substance and Sexual Activity  . Alcohol use: No  . Drug use: No  . Sexual activity: Yes  Lifestyle  . Physical activity:    Days per week: 4 days    Minutes per session: 50 min  . Stress: Rather much  Relationships  . Social connections:    Talks on phone: More than three times a week    Gets together: More than three times a week    Attends religious service: More than 4 times per year    Active member of club or  organization: Yes    Attends meetings of clubs or organizations: More than 4 times per year    Relationship status: Married  Other Topics Concern  . Not on file  Social History Narrative    CAFFEINE 3 CUPS DAILY    Outpatient Encounter Medications as of 06/03/2018  Medication Sig  . albuterol (PROVENTIL HFA;VENTOLIN HFA) 108 (90 Base) MCG/ACT inhaler Inhale 2 puffs every 6 (six) hours as needed into the lungs for wheezing or shortness of breath.  . Calcium-Vitamin D 600-125 MG-UNIT TABS Take by mouth daily. Take 2   . cholecalciferol (VITAMIN D) 400 UNITS TABS Take by mouth daily.    Marland Kitchen diltiazem (TIAZAC) 180 MG 24 hr capsule Take 1 capsule (180 mg total) by mouth daily.  Marland Kitchen omeprazole (PRILOSEC) 20 MG capsule Take 1 capsule (20 mg total) by mouth daily.  . vitamin C (ASCORBIC ACID) 500 MG tablet Take 500 mg by mouth daily.    . vitamin E (VITAMIN E) 400 UNIT capsule Take 400 Units by mouth daily.  . [DISCONTINUED] diltiazem (TIAZAC) 180 MG 24 hr capsule Take 1 capsule (180 mg total) daily by mouth.  . [DISCONTINUED] LORazepam (ATIVAN) 1 MG tablet TAKE 1/2 TABLET EVERY 6 TO 8 HOURS AS NEEDED  . [DISCONTINUED] omeprazole (PRILOSEC) 20 MG capsule Take 1 capsule (20 mg total) by mouth daily. -- Office visit needed for further refills   No facility-administered encounter medications on file as of 06/03/2018.     Activities of Daily Living In your present state of health, do you have any difficulty performing the following activities: 06/03/2018  Hearing? N  Vision? N  Difficulty concentrating or making decisions? N  Walking or climbing stairs? N  Dressing or bathing? N  Doing errands, shopping? Y  Preparing Food and eating ? N  Using the Toilet? N  In the past six months, have you accidently leaked urine? N  Do you have problems with loss of bowel control? N  Managing your Medications? N  Managing your Finances? N  Housekeeping or managing your Housekeeping? Y  Some recent data  might be hidden    Patient Care Team: Binnie Rail, MD as PCP - General (Internal Medicine)    Assessment:   This is a routine wellness examination  for [employer]. Physical assessment deferred to PCP.   Exercise Activities and Dietary recommendations Current Exercise Habits: Home exercise routine, Type of exercise: walking, Time (Minutes): 50, Frequency (Times/Week): 5, Weekly Exercise (Minutes/Week): 250, Intensity: Mild, Exercise limited by: orthopedic condition(s)  Diet (meal preparation, eat out, water intake, caffeinated beverages, dairy products, fruits and vegetables): in general, a "healthy" diet  , on average, 1 and then snack the rest of the day meals per day    Patient reports she does not tolerate supplements. Encouraged patient to eat high protein snacks often and to increase daily water and healthy fluid intake.   Goals    . Patient Stated     I would like to go camping in the mountains just with me and my husband.       Fall Risk Fall Risk  06/03/2018 05/18/2017 04/28/2016 06/06/2013 06/01/2012  Falls in the past year? 0 No No No No    Depression Screen PHQ 2/9 Scores 06/03/2018 05/18/2017 04/28/2016 06/06/2013  PHQ - 2 Score 2 0 0 2  PHQ- 9 Score 6 - - 5     Cognitive Function       Ad8 score reviewed for issues:  Issues making decisions: no  Less interest in hobbies / activities: no  Repeats questions, stories (family complaining): no  Trouble using ordinary gadgets (microwave, computer, phone):no  Forgets the month or year: no  Mismanaging finances: no  Remembering appts: no  Daily problems with thinking and/or memory: no Ad8 score is= 0  Immunization History  Administered Date(s) Administered  . Influenza Split 05/22/2011  . Influenza Whole 05/09/2010, 04/18/2012, 04/13/2013  . Influenza, High Dose Seasonal PF 04/28/2016, 04/20/2017  . Influenza,inj,Quad PF,6+ Mos 05/11/2015  . Influenza-Unspecified 04/17/2014, 04/13/2016, 04/20/2017  .  Pneumococcal Conjugate-13 10/04/2015  . Pneumococcal Polysaccharide-23 10/20/2016  . Td 03/07/2008   Screening Tests Health Maintenance  Topic Date Due  . TETANUS/TDAP  03/07/2018  . DEXA SCAN  06/12/2018  . COLONOSCOPY  10/31/2018  . MAMMOGRAM  08/05/2019  . INFLUENZA VACCINE  Completed  . Hepatitis C Screening  Completed  . PNA vac Low Risk Adult  Completed      Plan:     Continue doing brain stimulating activities (puzzles, reading, adult coloring books, staying active) to keep memory sharp.   Continue to eat heart healthy diet (full of fruits, vegetables, whole grains, lean protein, water--limit salt, fat, and sugar intake) and increase physical activity as tolerated.   I have personally reviewed and noted the following in the patient's chart:   . Medical and social history . Use of alcohol, tobacco or illicit drugs  . Current medications and supplements . Functional ability and status . Nutritional status . Physical activity . Advanced directives . List of other physicians . Vitals . Screenings to include cognitive, depression, and falls . Referrals and appointments  In addition, I have reviewed and discussed with patient certain preventive protocols, quality metrics, and best practice recommendations. A written personalized care plan for preventive services as well as general preventive health recommendations were provided to patient.     Michiel Cowboy, RN  06/03/2018    Medical screening examination/treatment/procedure(s) were performed by non-physician practitioner and as supervising physician I was immediately available for consultation/collaboration. I agree with above. Binnie Rail, MD

## 2018-06-03 NOTE — Assessment & Plan Note (Signed)
BP slightly elevated here, but overall well controlled  Current regimen effective and well tolerated Continue current medications at current doses Cmp, tsh

## 2018-06-03 NOTE — Patient Instructions (Addendum)
Continue doing brain stimulating activities (puzzles, reading, adult coloring books, staying active) to keep memory sharp.   Continue to eat heart healthy diet (full of fruits, vegetables, whole grains, lean protein, water--limit salt, fat, and sugar intake) and increase physical activity as tolerated.  Jennifer Stephenson , Thank you for taking time to come for your Medicare Wellness Visit. I appreciate your ongoing commitment to your health goals. Please review the following plan we discussed and let me know if I can assist you in the future.   These are the goals we discussed: Goals    . Patient Stated     I would like to go camping in the mountains just with me and my husband.       This is a list of the screening recommended for you and due dates:  Health Maintenance  Topic Date Due  . Tetanus Vaccine  03/07/2018  . DEXA scan (bone density measurement)  06/12/2018  . Colon Cancer Screening  10/31/2018  . Mammogram  08/05/2019  . Flu Shot  Completed  .  Hepatitis C: One time screening is recommended by Center for Disease Control  (CDC) for  adults born from [birth year] through 1966.   Completed  . Pneumonia vaccines  Completed

## 2018-06-03 NOTE — Assessment & Plan Note (Signed)
Discussed smoking cessation - she wants to quit but does not think she is ready Discussed chantix - this is the only thing she has not tried

## 2018-06-03 NOTE — Assessment & Plan Note (Signed)
GERD controlled Continue daily medication  

## 2018-06-03 NOTE — Assessment & Plan Note (Signed)
dexa due later this month Walking regularly Discussed smoking cessation Taking cal/vitamin d daily

## 2018-06-03 NOTE — Assessment & Plan Note (Signed)
[place] ordered to check nodule tsh

## 2018-06-03 NOTE — Assessment & Plan Note (Signed)
No h/o of asthma Uses albuterol with URI's only

## 2018-06-03 NOTE — Assessment & Plan Note (Signed)
Walks regularly which helps Takes ativan as needed, which works well Will continue

## 2018-06-04 ENCOUNTER — Ambulatory Visit
Admission: RE | Admit: 2018-06-04 | Discharge: 2018-06-04 | Disposition: A | Discharge status: Home / Self Care | Payer: Medicare HMO | Source: Ambulatory Visit | Attending: Internal Medicine | Admitting: Internal Medicine

## 2018-06-04 DIAGNOSIS — E041 Nontoxic single thyroid nodule: Secondary | ICD-10-CM

## 2018-06-04 DIAGNOSIS — E049 Nontoxic goiter, unspecified: Secondary | ICD-10-CM | POA: Diagnosis not present

## 2018-06-05 ENCOUNTER — Encounter: Payer: Self-pay | Admitting: Internal Medicine

## 2018-06-15 ENCOUNTER — Telehealth: Payer: Self-pay | Admitting: Internal Medicine

## 2018-06-15 DIAGNOSIS — M810 Age-related osteoporosis without current pathological fracture: Secondary | ICD-10-CM

## 2018-06-15 NOTE — Telephone Encounter (Signed)
Scheduled

## 2018-06-15 NOTE — Telephone Encounter (Signed)
Pt sent a message about getting a DEXA done, please order and I will call  To schedule.

## 2018-06-15 NOTE — Telephone Encounter (Signed)
Ordered. thanks

## 2018-06-16 ENCOUNTER — Ambulatory Visit (INDEPENDENT_AMBULATORY_CARE_PROVIDER_SITE_OTHER)
Admission: RE | Admit: 2018-06-16 | Discharge: 2018-06-16 | Disposition: A | Discharge status: Home / Self Care | Payer: Medicare HMO | Source: Ambulatory Visit | Attending: Internal Medicine | Admitting: Internal Medicine

## 2018-06-16 DIAGNOSIS — M810 Age-related osteoporosis without current pathological fracture: Secondary | ICD-10-CM

## 2018-06-17 ENCOUNTER — Encounter: Payer: Self-pay | Admitting: Internal Medicine

## 2018-08-26 DIAGNOSIS — Z1231 Encounter for screening mammogram for malignant neoplasm of breast: Secondary | ICD-10-CM | POA: Diagnosis not present

## 2018-08-27 ENCOUNTER — Other Ambulatory Visit: Payer: Self-pay | Admitting: Obstetrics and Gynecology

## 2018-08-27 DIAGNOSIS — R928 Other abnormal and inconclusive findings on diagnostic imaging of breast: Secondary | ICD-10-CM

## 2018-09-03 ENCOUNTER — Ambulatory Visit
Admission: RE | Admit: 2018-09-03 | Discharge: 2018-09-03 | Disposition: A | Discharge status: Home / Self Care | Payer: Medicare HMO | Source: Ambulatory Visit | Attending: Obstetrics and Gynecology | Admitting: Obstetrics and Gynecology

## 2018-09-03 ENCOUNTER — Other Ambulatory Visit: Payer: Self-pay | Admitting: Obstetrics and Gynecology

## 2018-09-03 DIAGNOSIS — N6310 Unspecified lump in the right breast, unspecified quadrant: Secondary | ICD-10-CM

## 2018-09-03 DIAGNOSIS — R928 Other abnormal and inconclusive findings on diagnostic imaging of breast: Secondary | ICD-10-CM

## 2018-09-03 DIAGNOSIS — R922 Inconclusive mammogram: Secondary | ICD-10-CM | POA: Diagnosis not present

## 2018-09-03 DIAGNOSIS — N6489 Other specified disorders of breast: Secondary | ICD-10-CM | POA: Diagnosis not present

## 2018-09-07 ENCOUNTER — Other Ambulatory Visit: Payer: Self-pay | Admitting: Obstetrics and Gynecology

## 2018-09-21 ENCOUNTER — Other Ambulatory Visit: Payer: Self-pay | Admitting: Internal Medicine

## 2018-09-21 DIAGNOSIS — F411 Generalized anxiety disorder: Secondary | ICD-10-CM

## 2018-09-21 NOTE — Telephone Encounter (Signed)
Last refill 06/03/18 Last OV 06/03/18 Next OV not made

## 2018-10-26 ENCOUNTER — Other Ambulatory Visit: Payer: Self-pay | Admitting: Internal Medicine

## 2018-10-26 DIAGNOSIS — J4520 Mild intermittent asthma, uncomplicated: Secondary | ICD-10-CM

## 2018-12-03 ENCOUNTER — Encounter: Payer: Self-pay | Admitting: Internal Medicine

## 2019-01-31 ENCOUNTER — Other Ambulatory Visit: Payer: Self-pay | Admitting: Internal Medicine

## 2019-01-31 DIAGNOSIS — F411 Generalized anxiety disorder: Secondary | ICD-10-CM

## 2019-01-31 NOTE — Telephone Encounter (Signed)
Last OV 06/03/18 Next OV- Not scheduled   [place] Controlled Substance Database checked. Last filled on 09/21/18

## 2019-02-03 ENCOUNTER — Other Ambulatory Visit: Payer: Self-pay | Admitting: Internal Medicine

## 2019-02-03 DIAGNOSIS — J4520 Mild intermittent asthma, uncomplicated: Secondary | ICD-10-CM

## 2019-03-10 ENCOUNTER — Other Ambulatory Visit: Payer: Self-pay

## 2019-03-10 ENCOUNTER — Ambulatory Visit
Admission: RE | Admit: 2019-03-10 | Discharge: 2019-03-10 | Disposition: A | Discharge status: Home / Self Care | Payer: Medicare HMO | Source: Ambulatory Visit | Attending: Obstetrics and Gynecology | Admitting: Obstetrics and Gynecology

## 2019-03-10 ENCOUNTER — Ambulatory Visit: Payer: Medicare HMO

## 2019-03-10 DIAGNOSIS — R922 Inconclusive mammogram: Secondary | ICD-10-CM | POA: Diagnosis not present

## 2019-03-10 DIAGNOSIS — N6310 Unspecified lump in the right breast, unspecified quadrant: Secondary | ICD-10-CM

## 2019-03-29 DIAGNOSIS — Z23 Encounter for immunization: Secondary | ICD-10-CM | POA: Diagnosis not present

## 2019-04-11 ENCOUNTER — Telehealth: Payer: Self-pay | Admitting: Internal Medicine

## 2019-04-11 MED ORDER — MECLIZINE HCL 12.5 MG PO TABS: 12.5000 mg | ORAL_TABLET | Freq: Three times a day (TID) | ORAL | 0 refills | Status: DC | PRN

## 2019-04-11 NOTE — Telephone Encounter (Signed)
rx sent to [facility]

## 2019-04-11 NOTE — Telephone Encounter (Signed)
Called patient to schedule AWV. Patient did not answer, no voicemail set up. Could not leave message. SF

## 2019-04-11 NOTE — Telephone Encounter (Signed)
Ov

## 2019-04-11 NOTE — Telephone Encounter (Signed)
Pt is aware.  

## 2019-04-11 NOTE — Telephone Encounter (Signed)
Patient calling and states that she has been having some inner ear trouble and was wondering if Dr Quay Burow could call in some Meclizine. States that she was told to use dramamine, but it is not working. Please advise.  [id number] - [place], [place] - [address]

## 2019-04-13 DIAGNOSIS — R69 Illness, unspecified: Secondary | ICD-10-CM | POA: Diagnosis not present

## 2019-05-12 DIAGNOSIS — Z0100 Encounter for examination of eyes and vision without abnormal findings: Secondary | ICD-10-CM | POA: Diagnosis not present

## 2019-05-12 DIAGNOSIS — H524 Presbyopia: Secondary | ICD-10-CM | POA: Diagnosis not present

## 2019-05-31 ENCOUNTER — Telehealth: Payer: Self-pay

## 2019-05-31 NOTE — Telephone Encounter (Signed)
Pt states that she is having pelvic pain right at the hairline. She said it feels like her "insides want to fall out." She has a lot of pressure and it started yesterday morning. No urinary sxs. I did ask her if she had a GYN doctor which she does. She ill try to call them to see when they can see her. I told her to call back and let me know. Did not know if you would be willing to work her in tomorrow or have her see someone else if needed.

## 2019-05-31 NOTE — Telephone Encounter (Signed)
Will wait for her to call and let me know.

## 2019-05-31 NOTE — Telephone Encounter (Signed)
Copied from [place] [phone]. Topic: General - Inquiry >> May 31, 2019 11:02 AM Alease Frame wrote: Reason for [id number] would like Dr Quay Burow Nurse to give her a call regarding a pain in her stomach . Please advise

## 2019-05-31 NOTE — Telephone Encounter (Signed)
Probably can work her in tomorrow if needed

## 2019-06-06 ENCOUNTER — Encounter: Payer: Medicare HMO | Admitting: Internal Medicine

## 2019-06-07 NOTE — Progress Notes (Addendum)
Subjective:   Jennifer Stephenson is a 68 y.o. female who presents for Medicare Annual (Subsequent) preventive examination. I connected with patient by a telephone and verified that I am speaking with the correct person using two identifiers. Patient stated full name and DOB. Patient gave permission to continue with telephonic visit. Patient's location was at home and Nurse's location was at [place] office. Participants during this visit included patient and nurse.  Review of Systems:   Cardiac Risk Factors include: advanced age (>31men, >80 women);dyslipidemia;hypertension Sleep patterns: has frequent nighttime awakenings, gets up 1-2 times nightly to void and sleep hours vary nightly. Patient reports insomnia issues, discussed recommended sleep tips.    Home Safety/Smoke Alarms: Feels safe in home. Smoke alarms in place.  Living environment; residence and Firearm Safety: 1-story house/ trailer. Lives with husband, no needs for DME, good support system Seat Belt Safety/Bike Helmet: Wears seat belt.     Objective:     Vitals: There were no vitals taken for this visit.  There is no height or weight on file to calculate BMI.  Advanced Directives 06/13/2019 06/03/2018  Does Patient Have a Medical Advance Directive? No No  Does patient want to make changes to medical advance directive? No - Patient declined -  Would patient like information on creating a medical advance directive? - Yes (ED - Information included in AVS)    Tobacco Social History   Tobacco Use  Smoking Status Current Every Day Smoker  . Packs/day: 0.50  Smokeless Tobacco Never Used  Tobacco Comment   smoked 1967- present, up to 1 ppd; now 1/2-3/4 ppd     Ready to quit: Not Answered Counseling given: Not Answered Comment: smoked 1967- present, up to 1 ppd; now 1/2-3/4 ppd  Past Medical History:  Diagnosis Date  . Adenomatous colon polyp 2007 & 2010  . Allergy    seasonal  . Anemia    PMH of   . DDD  (degenerative disc disease), cervical   . Dysphagia   . Endometriosis   . Fibrocystic breast disease   . Fracture of wrist, closed    bilaterally age 13 (  fell skating )  . GERD (gastroesophageal reflux disease)   . HTN (hypertension)   . Hyperlipidemia   . Interstitial cystitis   . Osteopenia    [place] DEXA  . Syncope 2007   seen in ER ; Dx: dehydration   Past Surgical History:  Procedure Laterality Date  . ABDOMINAL HYSTERECTOMY  1996   & BSO FOR ENDOMETRIOSIS  . CERVICAL FUSION     X3:.2001 C4-6, 2007 C6-7,2012C3-4 (?)  . COLONOSCOPY  2013   negative  . COLONOSCOPY W/ POLYPECTOMY  2007 & 2011   ADENOMATOUS polyp, internal hemorrhoids  . EGD WITH ESOPHAGEAL DILATION  05/15/2009,07/03/11  . ESOPHAGEAL MANOMETRY  10/27/2011   Procedure: ESOPHAGEAL MANOMETRY (EM);  Surgeon: Gatha Mayer, MD;  Location: [facility];  Service: Endoscopy;  Laterality: N/A;  . SEPTOPLASTY    . TUBAL LIGATION     Family History  Problem Relation Age of Onset  . Dementia Mother   . Ulcers Sister   . Stroke Sister         X 2; both > 65  . Ulcers Brother   . Diabetes Paternal Uncle   . Cancer Maternal Grandmother        CNS  . Heart attack Brother         in 20s  . Cancer Brother  PANCREATIC   . Diabetes Brother        X 2  . Stroke Brother        X2 , both > 55   Social History   Socioeconomic History  . Marital status: Married    Spouse name: Not on file  . Number of children: 2  . Years of education: Not on file  . Highest education level: Not on file  Occupational History  . Occupation: DISABLED  Social Needs  . Financial resource strain: Not hard at all  . Food insecurity    Worry: Never true    Inability: Never true  . Transportation needs    Medical: No    Non-medical: No  Tobacco Use  . Smoking status: Current Every Day Smoker    Packs/day: 0.50  . Smokeless tobacco: Never Used  . Tobacco comment: smoked 1967- present, up to 1 ppd; now 1/2-3/4 ppd   Substance and Sexual Activity  . Alcohol use: No  . Drug use: No  . Sexual activity: Yes  Lifestyle  . Physical activity    Days per week: 5 days    Minutes per session: 40 min  . Stress: Rather much  Relationships  . Social connections    Talks on phone: More than three times a week    Gets together: More than three times a week    Attends religious service: More than 4 times per year    Active member of club or organization: Yes    Attends meetings of clubs or organizations: More than 4 times per year    Relationship status: Married  Other Topics Concern  . Not on file  Social History Narrative    CAFFEINE 3 CUPS DAILY    Outpatient Encounter Medications as of 06/13/2019  Medication Sig  . Calcium-Vitamin D 600-125 MG-UNIT TABS Take by mouth daily. Take 2   . diltiazem (TIAZAC) 180 MG 24 hr capsule Take 1 capsule (180 mg total) by mouth daily.  Marland Kitchen GARLIC OIL PO Take 1 tablet by mouth daily.  Marland Kitchen LORazepam (ATIVAN) 1 MG tablet TAKE 1/2 TABLET EVERY 6 TO 8 HOURS AS NEEDED.  Marland Kitchen meclizine (ANTIVERT) 12.5 MG tablet Take 1-2 tablets (12.5-25 mg total) by mouth 3 (three) times daily as needed for dizziness.  Marland Kitchen omeprazole (PRILOSEC) 20 MG capsule Take 1 capsule (20 mg total) by mouth daily.  . VENTOLIN HFA 108 (90 Base) MCG/ACT inhaler INHALE 2 PUFFS EVERY 6 (SIX) HOURS AS NEEDED INTO THE LUNGS FOR WHEEZING OR SHORTNESS OF BREATH.  . vitamin E (VITAMIN E) 400 UNIT capsule Take 400 Units by mouth daily.  . [DISCONTINUED] cholecalciferol (VITAMIN D) 400 UNITS TABS Take by mouth daily.    . [DISCONTINUED] vitamin C (ASCORBIC ACID) 500 MG tablet Take 500 mg by mouth daily.     No facility-administered encounter medications on file as of 06/13/2019.     Activities of Daily Living In your present state of health, do you have any difficulty performing the following activities: 06/13/2019  Hearing? N  Vision? N  Difficulty concentrating or making decisions? N  Walking or climbing stairs? N   Dressing or bathing? N  Doing errands, shopping? N  Preparing Food and eating ? N  Using the Toilet? N  In the past six months, have you accidently leaked urine? N  Do you have problems with loss of bowel control? N  Managing your Medications? N  Managing your Finances? N  Housekeeping or managing your  Housekeeping? N  Some recent data might be hidden    Patient Care Team: Binnie Rail, MD as PCP - General (Internal Medicine)    Assessment:   This is a routine wellness examination for [place]. Physical assessment deferred to PCP.   Exercise Activities and Dietary recommendations Current Exercise Habits: Home exercise routine, Type of exercise: walking, Time (Minutes): 40, Frequency (Times/Week): 5, Weekly Exercise (Minutes/Week): 200, Intensity: Mild, Exercise limited by: orthopedic condition(s) Diet (meal preparation, eat out, water intake, caffeinated beverages, dairy products, fruits and vegetables): in general, a "healthy" diet  , well balanced   Reviewed heart healthy diet. Encouraged patient to increase daily water and healthy fluid intake.  Goals    . Patient Stated     I would like to go camping in the mountains just with me and my husband.    . Patient Stated     I would like to quit smoking by perhaps cutting back gradually until I can eventually quit.       Fall Risk Fall Risk  06/13/2019 06/03/2018 05/18/2017 04/28/2016 06/06/2013  Falls in the past year? 0 0 No No No  Number falls in past yr: 0 - - - -  Injury with Fall? 0 - - - -    Depression Screen PHQ 2/9 Scores 06/13/2019 06/03/2018 05/18/2017 04/28/2016  PHQ - 2 Score 2 2 0 0  PHQ- 9 Score 4 6 - -     Cognitive Function       Ad8 score reviewed for issues:  Issues making decisions: no  Less interest in hobbies / activities: no  Repeats questions, stories (family complaining): no  Trouble using ordinary gadgets (microwave, computer, phone):no  Forgets the month or year: no   Mismanaging finances: no  Remembering appts: no  Daily problems with thinking and/or memory: no Ad8 score is= 0  Immunization History  Administered Date(s) Administered  . Influenza Split 05/22/2011  . Influenza Whole 05/09/2010, 04/18/2012, 04/13/2013  . Influenza, High Dose Seasonal PF 04/28/2016, 04/20/2017, 04/13/2019  . Influenza,inj,Quad PF,6+ Mos 05/11/2015  . Influenza-Unspecified 04/17/2014, 04/13/2016, 04/20/2017  . Pneumococcal Conjugate-13 10/04/2015  . Pneumococcal Polysaccharide-23 10/20/2016  . Td 03/07/2008    Screening Tests Health Maintenance  Topic Date Due  . TETANUS/TDAP  03/07/2018  . COLONOSCOPY  10/31/2018  . MAMMOGRAM  08/05/2019  . DEXA SCAN  06/16/2020  . INFLUENZA VACCINE  Completed  . Hepatitis C Screening  Completed  . PNA vac Low Risk Adult  Completed       Plan:    Reviewed health maintenance screenings with patient today and relevant education, vaccines, and/or referrals were provided. Dr. Carlean Purl sent a letter on 12/03/18 stating that patient's colonoscopy follow up screening in now recommended in 4/23 due to nationally recognized guidelines for colorectal cancer.   Continue to eat heart healthy diet (full of fruits, vegetables, whole grains, lean protein, water--limit salt, fat, and sugar intake) and increase physical activity as tolerated.  Continue doing brain stimulating activities (puzzles, reading, adult coloring books, staying active) to keep memory sharp.   I have personally reviewed and noted the following in the patient's chart:   . Medical and social history . Use of alcohol, tobacco or illicit drugs  . Current medications and supplements . Functional ability and status . Nutritional status . Physical activity . Advanced directives . List of other physicians . Screenings to include cognitive, depression, and falls . Referrals and appointments  In addition, I have reviewed and discussed with  patient certain preventive  protocols, quality metrics, and best practice recommendations. A written personalized care plan for preventive services as well as general preventive health recommendations were provided to patient.     Michiel Cowboy, RN  06/13/2019   Medical screening examination/treatment/procedure(s) were performed by non-physician practitioner and as supervising physician I was immediately available for consultation/collaboration. I agree with above. Binnie Rail, MD

## 2019-06-13 ENCOUNTER — Ambulatory Visit (INDEPENDENT_AMBULATORY_CARE_PROVIDER_SITE_OTHER): Payer: Medicare HMO | Admitting: *Deleted

## 2019-06-13 DIAGNOSIS — Z0000 Encounter for general adult medical examination without abnormal findings: Secondary | ICD-10-CM

## 2019-06-16 NOTE — Progress Notes (Signed)
Subjective:    Patient ID: Jennifer Stephenson, female    DOB: [birth year], 68 y.o.   MRN: [id number]  HPI She is here for a physical exam.   She had an episode of severe lower abdominal pain.  It was about two weeks ago.  It lasted one week.  The pain was constant and it felt like her insides were going to fall out.  She denies urinary symptoms, change in bowels, blood in the stool.  Sitting down hurt worse.  She has an uncomfortable feeling at times in her lower abdomen, but no pain.    She has no other concerns.  Medications and allergies reviewed with patient and updated if appropriate.  Patient Active Problem List   Diagnosis Date Noted  . Lower abdominal pain 06/17/2019  . Reactive airway disease 05/18/2017  . Generalized anxiety disorder 05/18/2017  . Dizziness 04/28/2016  . Osteoporosis 10/04/2015  . Elevated hematocrit 06/10/2014  . GERD (gastroesophageal reflux disease) 10/25/2013  . Abnormal EKG 06/01/2012  . COLONIC POLYPS, HX OF 09/19/2008  . THYROID NODULE 03/28/2008  . Hyperlipidemia 03/07/2008  . Tobacco abuse 03/07/2008  . Esophageal dysphagia 03/07/2008  . INTERSTITIAL CYSTITIS 07/29/2007  . Essential hypertension 05/27/2007    Current Outpatient Medications on File Prior to Visit  Medication Sig Dispense Refill  . Calcium-Vitamin D 600-125 MG-UNIT TABS Take by mouth daily. Take 2     . GARLIC OIL PO Take 1 tablet by mouth daily.    . meclizine (ANTIVERT) 12.5 MG tablet Take 1-2 tablets (12.5-25 mg total) by mouth 3 (three) times daily as needed for dizziness. 30 tablet 0  . VENTOLIN HFA 108 (90 Base) MCG/ACT inhaler INHALE 2 PUFFS EVERY 6 (SIX) HOURS AS NEEDED INTO THE LUNGS FOR WHEEZING OR SHORTNESS OF BREATH. 36 g 2  . vitamin E (VITAMIN E) 400 UNIT capsule Take 400 Units by mouth daily.     No current facility-administered medications on file prior to visit.     Past Medical History:  Diagnosis Date  . Adenomatous colon polyp 2007 & 2010  . Allergy     seasonal  . Anemia    PMH of   . DDD (degenerative disc disease), cervical   . Dysphagia   . Endometriosis   . Fibrocystic breast disease   . Fracture of wrist, closed    bilaterally age 82 (  fell skating )  . GERD (gastroesophageal reflux disease)   . HTN (hypertension)   . Hyperlipidemia   . Interstitial cystitis   . Osteopenia    [place] DEXA  . Syncope 2007   seen in ER ; Dx: dehydration    Past Surgical History:  Procedure Laterality Date  . ABDOMINAL HYSTERECTOMY  1996   & BSO FOR ENDOMETRIOSIS  . CERVICAL FUSION     X3:.2001 C4-6, 2007 C6-7,2012C3-4 (?)  . COLONOSCOPY  2013   negative  . COLONOSCOPY W/ POLYPECTOMY  2007 & 2011   ADENOMATOUS polyp, internal hemorrhoids  . EGD WITH ESOPHAGEAL DILATION  05/15/2009,07/03/11  . ESOPHAGEAL MANOMETRY  10/27/2011   Procedure: ESOPHAGEAL MANOMETRY (EM);  Surgeon: Gatha Mayer, MD;  Location: [facility];  Service: Endoscopy;  Laterality: N/A;  . SEPTOPLASTY    . TUBAL LIGATION      Social History   Socioeconomic History  . Marital status: Married    Spouse name: Not on file  . Number of children: 2  . Years of education: Not on file  . Highest education  level: Not on file  Occupational History  . Occupation: DISABLED  Social Needs  . Financial resource strain: Not hard at all  . Food insecurity    Worry: Never true    Inability: Never true  . Transportation needs    Medical: No    Non-medical: No  Tobacco Use  . Smoking status: Current Every Day Smoker    Packs/day: 0.50  . Smokeless tobacco: Never Used  . Tobacco comment: smoked 1967- present, up to 1 ppd; now 1/2-3/4 ppd  Substance and Sexual Activity  . Alcohol use: No  . Drug use: No  . Sexual activity: Yes  Lifestyle  . Physical activity    Days per week: 5 days    Minutes per session: 40 min  . Stress: Rather much  Relationships  . Social connections    Talks on phone: More than three times a week    Gets together: More than three times  a week    Attends religious service: More than 4 times per year    Active member of club or organization: Yes    Attends meetings of clubs or organizations: More than 4 times per year    Relationship status: Married  Other Topics Concern  . Not on file  Social History Narrative    CAFFEINE 3 CUPS DAILY    Family History  Problem Relation Age of Onset  . Dementia Mother   . Ulcers Sister   . Stroke Sister         X 2; both > 65  . Ulcers Brother   . Diabetes Paternal Uncle   . Cancer Maternal Grandmother        CNS  . Heart attack Brother         in 40s  . Cancer Brother        PANCREATIC   . Diabetes Brother        X 2  . Stroke Brother        X2 , both > 55    Review of Systems  Constitutional: Negative for chills and fever.  Eyes: Negative for visual disturbance.  Respiratory: Positive for cough (smokers cough). Negative for shortness of breath and wheezing.   Cardiovascular: Positive for palpitations (rare). Negative for chest pain and leg swelling.  Gastrointestinal: Positive for abdominal pain. Negative for blood in stool, constipation, diarrhea and nausea.       Gerd occ  Genitourinary: Negative for difficulty urinating, dysuria, frequency and hematuria.  Musculoskeletal: Positive for arthralgias and back pain.  Skin: Negative for color change and rash.  Neurological: Positive for dizziness (occasional) and headaches.  Psychiatric/Behavioral: Negative for dysphoric mood. The patient is nervous/anxious.        Objective:   Vitals:   06/17/19 0745  BP: (!) 154/96  Pulse: 83  Resp: 16  Temp: 98.2 F (36.8 C)  SpO2: 98%   Filed Weights   06/17/19 0745  Weight: 88 lb 1.9 oz (40 kg)   Body mass index is 16.12 kg/m.  BP Readings from Last 3 Encounters:  06/17/19 (!) 154/96  06/03/18 (!) 142/94  06/03/18 (!) 142/94    Wt Readings from Last 3 Encounters:  06/17/19 88 lb 1.9 oz (40 kg)  06/03/18 88 lb (39.9 kg)  06/03/18 88 lb 1.9 oz (40 kg)      Physical Exam Constitutional: She appears well-developed and well-nourished. No distress.  HENT:  Head: Normocephalic and atraumatic.  Right Ear: External ear normal. Normal ear  canal and TM Left Ear: External ear normal.  Normal ear canal and TM Mouth/Throat: Oropharynx is clear and moist.  Eyes: Conjunctivae and EOM are normal.  Neck: Neck supple. No tracheal deviation present. No thyromegaly present.  No carotid bruit  Cardiovascular: Normal rate, regular rhythm and normal heart sounds.   No murmur heard.  No edema. Pulmonary/Chest: Effort normal and breath sounds normal. No respiratory distress. She has no wheezes. She has no rales.  Breast: deferred   Abdominal: Soft. She exhibits no distension. There is no tenderness.  Lymphadenopathy: She has no cervical adenopathy.  Skin: Skin is warm and dry. She is not diaphoretic.  Psychiatric: She has a normal mood and affect. Her behavior is normal.        Assessment & Plan:   Physical exam: Screening blood work    ordered Immunizations  Discussed shingrix, others up to date Colonoscopy   Up to date  Mammogram  Up to date  Gyn  Up to date  Dexa  Up to date  Eye exams  Up to date  Exercise  Walks  Weight  BMI low Substance abuse  Current smoker - wants to quit, but not ready to quit, no other concerns.  See Problem List for Assessment and Plan of chronic medical problems.    This visit occurred during the SARS-CoV-2 public health emergency.  Safety protocols were in place, including screening questions prior to the visit, additional usage of staff PPE, and extensive cleaning of exam room while observing appropriate contact time as indicated for disinfecting solutions.     Fu in one year

## 2019-06-16 NOTE — Patient Instructions (Addendum)
Tests ordered today. Your results will be released to [facility] (or called to you) after review.  If any changes need to be made, you will be notified at that same time.  All other Health Maintenance issues reviewed.   All recommended immunizations and age-appropriate screenings are up-to-date or discussed.  No immunization administered today.   Medications reviewed and updated.  Changes include :   none  Your prescription(s) have been submitted to your pharmacy. Please take as directed and contact our office if you believe you are having problem(s) with the medication(s).   Please followup in 1 year    Health Maintenance, Female Adopting a healthy lifestyle and getting preventive care are important in promoting health and wellness. Ask your health care provider about:  The right schedule for you to have regular tests and exams.  Things you can do on your own to prevent diseases and keep yourself healthy. What should I know about diet, weight, and exercise? Eat a healthy diet   Eat a diet that includes plenty of vegetables, fruits, low-fat dairy products, and lean protein.  Do not eat a lot of foods that are high in solid fats, added sugars, or sodium. Maintain a healthy weight Body mass index (BMI) is used to identify weight problems. It estimates body fat based on height and weight. Your health care provider can help determine your BMI and help you achieve or maintain a healthy weight. Get regular exercise Get regular exercise. This is one of the most important things you can do for your health. Most adults should:  Exercise for at least 150 minutes each week. The exercise should increase your heart rate and make you sweat (moderate-intensity exercise).  Do strengthening exercises at least twice a week. This is in addition to the moderate-intensity exercise.  Spend less time sitting. Even light physical activity can be beneficial. Watch cholesterol and blood lipids Have your  blood tested for lipids and cholesterol at 69 years of age, then have this test every 5 years. Have your cholesterol levels checked more often if:  Your lipid or cholesterol levels are high.  You are older than 69 years of age.  You are at high risk for heart disease. What should I know about cancer screening? Depending on your health history and family history, you may need to have cancer screening at various ages. This may include screening for:  Breast cancer.  Cervical cancer.  Colorectal cancer.  Skin cancer.  Lung cancer. What should I know about heart disease, diabetes, and high blood pressure? Blood pressure and heart disease  High blood pressure causes heart disease and increases the risk of stroke. This is more likely to develop in people who have high blood pressure readings, are of African descent, or are overweight.  Have your blood pressure checked: ? Every 3-5 years if you are 76-61 years of age. ? Every year if you are 14 years old or older. Diabetes Have regular diabetes screenings. This checks your fasting blood sugar level. Have the screening done:  Once every three years after age 42 if you are at a normal weight and have a low risk for diabetes.  More often and at a younger age if you are overweight or have a high risk for diabetes. What should I know about preventing infection? Hepatitis B If you have a higher risk for hepatitis B, you should be screened for this virus. Talk with your health care provider to find out if you are at risk  for hepatitis B infection. Hepatitis C Testing is recommended for:  Everyone born from [birth year] through 1966.  Anyone with known risk factors for hepatitis C. Sexually transmitted infections (STIs)  Get screened for STIs, including gonorrhea and chlamydia, if: ? You are sexually active and are younger than 69 years of age. ? You are older than 70 years of age and your health care provider tells you that you are at risk  for this type of infection. ? Your sexual activity has changed since you were last screened, and you are at increased risk for chlamydia or gonorrhea. Ask your health care provider if you are at risk.  Ask your health care provider about whether you are at high risk for HIV. Your health care provider may recommend a prescription medicine to help prevent HIV infection. If you choose to take medicine to prevent HIV, you should first get tested for HIV. You should then be tested every 3 months for as long as you are taking the medicine. Pregnancy  If you are about to stop having your period (premenopausal) and you may become pregnant, seek counseling before you get pregnant.  Take 400 to 800 micrograms (mcg) of folic acid every day if you become pregnant.  Ask for birth control (contraception) if you want to prevent pregnancy. Osteoporosis and menopause Osteoporosis is a disease in which the bones lose minerals and strength with aging. This can result in bone fractures. If you are 61 years old or older, or if you are at risk for osteoporosis and fractures, ask your health care provider if you should:  Be screened for bone loss.  Take a calcium or vitamin D supplement to lower your risk of fractures.  Be given hormone replacement therapy (HRT) to treat symptoms of menopause. Follow these instructions at home: Lifestyle  Do not use any products that contain nicotine or tobacco, such as cigarettes, e-cigarettes, and chewing tobacco. If you need help quitting, ask your health care provider.  Do not use street drugs.  Do not share needles.  Ask your health care provider for help if you need support or information about quitting drugs. Alcohol use  Do not drink alcohol if: ? Your health care provider tells you not to drink. ? You are pregnant, may be pregnant, or are planning to become pregnant.  If you drink alcohol: ? Limit how much you use to 0-1 drink a day. ? Limit intake if you are  breastfeeding.  Be aware of how much alcohol is in your drink. In the [place] one drink equals one 12 oz bottle of beer (355 mL), one 5 oz glass of wine (148 mL), or one 1 oz glass of hard liquor (44 mL). General instructions  Schedule regular health, dental, and eye exams.  Stay current with your vaccines.  Tell your health care provider if: ? You often feel depressed. ? You have ever been abused or do not feel safe at home. Summary  Adopting a healthy lifestyle and getting preventive care are important in promoting health and wellness.  Follow your health care provider's instructions about healthy diet, exercising, and getting tested or screened for diseases.  Follow your health care provider's instructions on monitoring your cholesterol and blood pressure. This information is not intended to replace advice given to you by your health care provider. Make sure you discuss any questions you have with your health care provider. Document Released: 01/13/2011 Document Revised: 06/23/2018 Document Reviewed: 06/23/2018 Elsevier Patient Education  2020 [employer].

## 2019-06-17 ENCOUNTER — Other Ambulatory Visit (INDEPENDENT_AMBULATORY_CARE_PROVIDER_SITE_OTHER): Payer: Medicare HMO

## 2019-06-17 ENCOUNTER — Ambulatory Visit (INDEPENDENT_AMBULATORY_CARE_PROVIDER_SITE_OTHER): Payer: Medicare HMO | Admitting: Internal Medicine

## 2019-06-17 ENCOUNTER — Other Ambulatory Visit: Payer: Self-pay

## 2019-06-17 ENCOUNTER — Encounter: Payer: Self-pay | Admitting: Internal Medicine

## 2019-06-17 VITALS — BP 154/96 | HR 83 | Temp 98.2°F | Resp 16 | Ht 62.0 in | Wt 88.1 lb

## 2019-06-17 DIAGNOSIS — E7849 Other hyperlipidemia: Secondary | ICD-10-CM

## 2019-06-17 DIAGNOSIS — R1030 Lower abdominal pain, unspecified: Secondary | ICD-10-CM

## 2019-06-17 DIAGNOSIS — K219 Gastro-esophageal reflux disease without esophagitis: Secondary | ICD-10-CM

## 2019-06-17 DIAGNOSIS — Z720 Tobacco use: Secondary | ICD-10-CM

## 2019-06-17 DIAGNOSIS — R69 Illness, unspecified: Secondary | ICD-10-CM | POA: Diagnosis not present

## 2019-06-17 DIAGNOSIS — Z0000 Encounter for general adult medical examination without abnormal findings: Secondary | ICD-10-CM | POA: Diagnosis not present

## 2019-06-17 DIAGNOSIS — F411 Generalized anxiety disorder: Secondary | ICD-10-CM

## 2019-06-17 DIAGNOSIS — I10 Essential (primary) hypertension: Secondary | ICD-10-CM | POA: Diagnosis not present

## 2019-06-17 DIAGNOSIS — M810 Age-related osteoporosis without current pathological fracture: Secondary | ICD-10-CM | POA: Diagnosis not present

## 2019-06-17 LAB — CBC WITH DIFFERENTIAL/PLATELET
Basophils Absolute: 0.1 10*3/uL (ref 0.0–0.1)
Basophils Relative: 1.5 % (ref 0.0–3.0)
Eosinophils Absolute: 0.2 10*3/uL (ref 0.0–0.7)
Eosinophils Relative: 2.3 % (ref 0.0–5.0)
HCT: 45.4 % (ref 36.0–46.0)
Hemoglobin: 15.3 g/dL — ABNORMAL HIGH (ref 12.0–15.0)
Lymphocytes Relative: 19.8 % (ref 12.0–46.0)
Lymphs Abs: 1.4 10*3/uL (ref 0.7–4.0)
MCHC: 33.7 g/dL (ref 30.0–36.0)
MCV: 88.4 fl (ref 78.0–100.0)
Monocytes Absolute: 0.5 10*3/uL (ref 0.1–1.0)
Monocytes Relative: 7.4 % (ref 3.0–12.0)
Neutro Abs: 4.7 10*3/uL (ref 1.4–7.7)
Neutrophils Relative %: 69 % (ref 43.0–77.0)
Platelets: 210 10*3/uL (ref 150.0–400.0)
RBC: 5.14 Mil/uL — ABNORMAL HIGH (ref 3.87–5.11)
RDW: 14.3 % (ref 11.5–15.5)
WBC: 6.9 10*3/uL (ref 4.0–10.5)

## 2019-06-17 LAB — LIPID PANEL
Cholesterol: 197 mg/dL (ref 0–200)
HDL: 59.3 mg/dL (ref >39.00)
LDL Cholesterol: 122 mg/dL — ABNORMAL HIGH (ref 0–99)
NonHDL: 137.9
Total CHOL/HDL Ratio: 3
Triglycerides: 81 mg/dL (ref 0.0–149.0)
VLDL: 16.2 mg/dL (ref 0.0–40.0)

## 2019-06-17 LAB — URINALYSIS, ROUTINE W REFLEX MICROSCOPIC
Bilirubin Urine: NEGATIVE
Ketones, ur: NEGATIVE
Nitrite: NEGATIVE
Specific Gravity, Urine: 1.015 (ref 1.000–1.030)
Total Protein, Urine: NEGATIVE
Urine Glucose: NEGATIVE
Urobilinogen, UA: 0.2 (ref 0.0–1.0)
pH: 7 (ref 5.0–8.0)

## 2019-06-17 LAB — COMPREHENSIVE METABOLIC PANEL
ALT: 15 U/L (ref 0–35)
AST: 18 U/L (ref 0–37)
Albumin: 4.5 g/dL (ref 3.5–5.2)
Alkaline Phosphatase: 79 U/L (ref 39–117)
BUN: 13 mg/dL (ref 6–23)
CO2: 25 mEq/L (ref 19–32)
Calcium: 9.8 mg/dL (ref 8.4–10.5)
Chloride: 104 mEq/L (ref 96–112)
Creatinine, Ser: 0.75 mg/dL (ref 0.40–1.20)
GFR: 76.55 mL/min (ref >60.00)
Glucose, Bld: 91 mg/dL (ref 70–99)
Potassium: 4.3 mEq/L (ref 3.5–5.1)
Sodium: 138 mEq/L (ref 135–145)
Total Bilirubin: 0.8 mg/dL (ref 0.2–1.2)
Total Protein: 7.3 g/dL (ref 6.0–8.3)

## 2019-06-17 LAB — VITAMIN D 25 HYDROXY (VIT D DEFICIENCY, FRACTURES): VITD: 44.86 ng/mL (ref 30.00–100.00)

## 2019-06-17 LAB — TSH: TSH: 1.33 u[IU]/mL (ref 0.35–4.50)

## 2019-06-17 MED ORDER — OMEPRAZOLE 20 MG PO CPDR: 20.0000 mg | DELAYED_RELEASE_CAPSULE | Freq: Every day | ORAL | 3 refills | Status: DC

## 2019-06-17 MED ORDER — LORAZEPAM 1 MG PO TABS: ORAL_TABLET | ORAL | 0 refills | Status: DC

## 2019-06-17 MED ORDER — DILTIAZEM HCL ER BEADS 180 MG PO CP24: 180.0000 mg | ORAL_CAPSULE | Freq: Every day | ORAL | 3 refills | Status: DC

## 2019-06-17 NOTE — Assessment & Plan Note (Addendum)
dexa up to date Walking regularly Taking cal/vit d Check vitamin d level

## 2019-06-17 NOTE — Assessment & Plan Note (Signed)
Two weeks ago had an episode of severe pain that was constant and lasted one week - now only has discomfort sometimes No other symptoms S/p TAH, h/o IC Will check cmp, cbc, UA, UCx If no answers from above and discomfort persists will need imaging.

## 2019-06-17 NOTE — Assessment & Plan Note (Signed)
Check lipid panel  Regular exercise and healthy diet encouraged  

## 2019-06-17 NOTE — Assessment & Plan Note (Addendum)
Elevated here today - will recheck with smaller cuff - still high Has white coat htn Advised to monitor at home, which she does - better controlled at home Continue diltiazem cmp

## 2019-06-17 NOTE — Assessment & Plan Note (Addendum)
Intermittent anxiety Taking ativan prn continue

## 2019-06-17 NOTE — Assessment & Plan Note (Signed)
Still smoking Wants to quit, but not ready to quit

## 2019-06-17 NOTE — Assessment & Plan Note (Signed)
GERD controlled Continue daily medication  

## 2019-06-18 ENCOUNTER — Encounter: Payer: Self-pay | Admitting: Internal Medicine

## 2019-06-19 LAB — URINE CULTURE
MICRO NUMBER:: 1164844
SPECIMEN QUALITY:: ADEQUATE

## 2019-06-20 ENCOUNTER — Telehealth: Payer: Self-pay | Admitting: Internal Medicine

## 2019-06-20 MED ORDER — AMOXICILLIN-POT CLAVULANATE 875-125 MG PO TABS: 1.0000 | ORAL_TABLET | Freq: Two times a day (BID) | ORAL | 0 refills | Status: AC

## 2019-06-20 NOTE — Telephone Encounter (Signed)
LVM for pt to call back in regards.  

## 2019-06-20 NOTE — Telephone Encounter (Signed)
Pt called back for lab results. Please advise.

## 2019-06-20 NOTE — Telephone Encounter (Signed)
Pt aware of results 

## 2019-06-20 NOTE — Telephone Encounter (Signed)
Her urine culture does show an infection.  She needs to start an antibiotic -Augmentin will successfully treat the infection.  [facility] to [facility].

## 2019-07-29 ENCOUNTER — Other Ambulatory Visit: Payer: Self-pay | Admitting: Internal Medicine

## 2019-07-29 DIAGNOSIS — F411 Generalized anxiety disorder: Secondary | ICD-10-CM

## 2019-08-01 NOTE — Telephone Encounter (Signed)
Last OV 06/17/19 Next OV NA Last RF 06/17/19

## 2019-08-11 ENCOUNTER — Other Ambulatory Visit: Payer: Self-pay | Admitting: Obstetrics and Gynecology

## 2019-08-11 DIAGNOSIS — N6489 Other specified disorders of breast: Secondary | ICD-10-CM

## 2019-09-02 DIAGNOSIS — Z01419 Encounter for gynecological examination (general) (routine) without abnormal findings: Secondary | ICD-10-CM | POA: Diagnosis not present

## 2019-09-12 ENCOUNTER — Ambulatory Visit
Admission: RE | Admit: 2019-09-12 | Discharge: 2019-09-12 | Disposition: A | Discharge status: Home / Self Care | Payer: Medicare HMO | Source: Ambulatory Visit | Attending: Obstetrics and Gynecology | Admitting: Obstetrics and Gynecology

## 2019-09-12 ENCOUNTER — Ambulatory Visit: Payer: Medicare HMO

## 2019-09-12 ENCOUNTER — Other Ambulatory Visit: Payer: Self-pay

## 2019-09-12 DIAGNOSIS — R922 Inconclusive mammogram: Secondary | ICD-10-CM | POA: Diagnosis not present

## 2019-09-12 DIAGNOSIS — N6489 Other specified disorders of breast: Secondary | ICD-10-CM

## 2019-11-11 ENCOUNTER — Other Ambulatory Visit: Payer: Self-pay | Admitting: Internal Medicine

## 2019-11-11 DIAGNOSIS — F411 Generalized anxiety disorder: Secondary | ICD-10-CM

## 2019-11-11 NOTE — Telephone Encounter (Signed)
Last OV 06/17/19 Next OV NA Last RF 08/01/19

## 2020-01-26 ENCOUNTER — Encounter (HOSPITAL_COMMUNITY): Payer: Self-pay

## 2020-01-26 ENCOUNTER — Other Ambulatory Visit: Payer: Self-pay

## 2020-01-26 ENCOUNTER — Ambulatory Visit (HOSPITAL_COMMUNITY)
Admission: EM | Admit: 2020-01-26 | Discharge: 2020-01-26 | Disposition: A | Discharge status: Home / Self Care | Payer: Medicare HMO | Attending: Family Medicine | Admitting: Family Medicine

## 2020-01-26 DIAGNOSIS — M79671 Pain in right foot: Secondary | ICD-10-CM | POA: Diagnosis not present

## 2020-01-26 MED ORDER — NAPROXEN 500 MG PO TABS: 500.0000 mg | ORAL_TABLET | Freq: Two times a day (BID) | ORAL | 0 refills | Status: DC

## 2020-01-26 MED ORDER — CEPHALEXIN 500 MG PO CAPS
500.0000 mg | ORAL_CAPSULE | Freq: Two times a day (BID) | ORAL | 0 refills | Status: DC
Start: 2020-01-26 — End: 2020-04-13

## 2020-01-26 NOTE — ED Provider Notes (Signed)
[facility]    CSN: [id number] Arrival date & time: 01/26/20  1532      History   Chief Complaint Chief Complaint  Patient presents with  . Foot Pain    HPI Jennifer Stephenson is a 69 y.o. female.   HPI  Patient developed foot pain yesterday.  While she was out shopping and doing some walking she felt pain in her foot.  She did not have any accident or injury.  No overuse.  No new shoewear.  She does not have a history of foot problems or arthritis.  She does not have a history of gout.  There was no injury.  There was no wound. Patient states she is otherwise in good health.  She is on blood pressure medication, GERD medication, and has occasional vertigo. Patient states that she took ibuprofen 800 mg.  This gave her mild to moderate relief.  Did not completely relieve the pain. She states redness, swelling, and pain or worse today than they were yesterday.  She is having trouble getting into her shoes.  Past Medical History:  Diagnosis Date  . Adenomatous colon polyp 2007 & 2010  . Allergy    seasonal  . Anemia    PMH of   . DDD (degenerative disc disease), cervical   . Dysphagia   . Endometriosis   . Fibrocystic breast disease   . Fracture of wrist, closed    bilaterally age 54 (  fell skating )  . GERD (gastroesophageal reflux disease)   . HTN (hypertension)   . Hyperlipidemia   . Interstitial cystitis   . Osteopenia    [place] DEXA  . Syncope 2007   seen in ER ; Dx: dehydration    Patient Active Problem List   Diagnosis Date Noted  . Lower abdominal pain 06/17/2019  . Reactive airway disease 05/18/2017  . Generalized anxiety disorder 05/18/2017  . Dizziness 04/28/2016  . Osteoporosis 10/04/2015  . Elevated hematocrit 06/10/2014  . GERD (gastroesophageal reflux disease) 10/25/2013  . Abnormal EKG 06/01/2012  . COLONIC POLYPS, HX OF 09/19/2008  . THYROID NODULE 03/28/2008  . Hyperlipidemia 03/07/2008  . Tobacco abuse 03/07/2008  . Esophageal  dysphagia 03/07/2008  . INTERSTITIAL CYSTITIS 07/29/2007  . Essential hypertension 05/27/2007    Past Surgical History:  Procedure Laterality Date  . ABDOMINAL HYSTERECTOMY  1996   & BSO FOR ENDOMETRIOSIS  . CERVICAL FUSION     X3:.2001 C4-6, 2007 C6-7,2012C3-4 (?)  . COLONOSCOPY  2013   negative  . COLONOSCOPY W/ POLYPECTOMY  2007 & 2011   ADENOMATOUS polyp, internal hemorrhoids  . EGD WITH ESOPHAGEAL DILATION  05/15/2009,07/03/11  . ESOPHAGEAL MANOMETRY  10/27/2011   Procedure: ESOPHAGEAL MANOMETRY (EM);  Surgeon: Gatha Mayer, MD;  Location: [facility];  Service: Endoscopy;  Laterality: N/A;  . SEPTOPLASTY    . TUBAL LIGATION      OB History   No obstetric history on file.      Home Medications    Prior to Admission medications   Medication Sig Start Date End Date Taking? Authorizing Provider  Calcium-Vitamin D 600-125 MG-UNIT TABS Take by mouth daily. Take 2    Yes [provider]  diltiazem (TIAZAC) 180 MG 24 hr capsule Take 1 capsule (180 mg total) by mouth daily. 06/17/19  Yes Burns, Claudina Lick, MD  GARLIC OIL PO Take 1 tablet by mouth daily.   Yes [provider]  LORazepam (ATIVAN) 1 MG tablet TAKE 1/2 TABLET EVERY 6  TO 8 HOURS AS NEEDED. 11/11/19  Yes Burns, Claudina Lick, MD  meclizine (ANTIVERT) 12.5 MG tablet Take 1-2 tablets (12.5-25 mg total) by mouth 3 (three) times daily as needed for dizziness. 04/11/19  Yes Burns, Claudina Lick, MD  omeprazole (PRILOSEC) 20 MG capsule Take 1 capsule (20 mg total) by mouth daily. 06/17/19  Yes Burns, Claudina Lick, MD  vitamin E (VITAMIN E) 400 UNIT capsule Take 400 Units by mouth daily.   Yes [provider]  cephALEXin (KEFLEX) 500 MG capsule Take 1 capsule (500 mg total) by mouth 2 (two) times daily. 01/26/20   Raylene Everts, MD  naproxen (NAPROSYN) 500 MG tablet Take 1 tablet (500 mg total) by mouth 2 (two) times daily. 01/26/20   Raylene Everts, MD  VENTOLIN HFA 108 [phone] Base) MCG/ACT inhaler INHALE 2 PUFFS  EVERY 6 (SIX) HOURS AS NEEDED INTO THE LUNGS FOR WHEEZING OR SHORTNESS OF BREATH. 02/03/19   Binnie Rail, MD    Family History Family History  Problem Relation Age of Onset  . Dementia Mother   . Ulcers Sister   . Stroke Sister         X 2; both > 65  . Ulcers Brother   . Diabetes Paternal Uncle   . Cancer Maternal Grandmother        CNS  . Heart attack Brother         in 25s  . Cancer Brother        PANCREATIC   . Diabetes Brother        X 2  . Stroke Brother        X2 , both > 41    Social History Social History   Tobacco Use  . Smoking status: Current Every Day Smoker    Packs/day: 0.50  . Smokeless tobacco: Never Used  . Tobacco comment: smoked 1967- present, up to 1 ppd; now 1/2-3/4 ppd  Vaping Use  . Vaping Use: Never used  Substance Use Topics  . Alcohol use: No  . Drug use: No     Allergies   Bupropion hcl, Paroxetine, Pentazocine lactate, Ranitidine, Lipitor [atorvastatin], Pravastatin, Prednisone, and Sulfonamide derivatives   Review of Systems Review of Systems Foot pain Gait abnormality See HPI  Physical Exam Triage Vital Signs ED Triage Vitals  Enc Vitals Group     BP 01/26/20 1730 133/65     Pulse Rate 01/26/20 1730 73     Resp 01/26/20 1730 16     Temp 01/26/20 1730 98.2 F (36.8 C)     Temp Source 01/26/20 1730 Oral     SpO2 01/26/20 1730 99 %     Weight --      Height --      Head Circumference --      Peak Flow --      Pain Score 01/26/20 1729 4     Pain Loc --      Pain Edu? --      Excl. in [place]? --    No data found.  Updated Vital Signs BP 133/65 (BP Location: Left Arm)   Pulse 73   Temp 98.2 F (36.8 C) (Oral)   Resp 16   SpO2 99%     Physical Exam Constitutional:      General: She is not in acute distress.    Appearance: She is well-developed.  HENT:     Head: Normocephalic and atraumatic.     Nose:  Comments: Mask is in place Eyes:     Conjunctiva/sclera: Conjunctivae normal.     Pupils: Pupils are  equal, round, and reactive to light.  Cardiovascular:     Rate and Rhythm: Normal rate.  Pulmonary:     Effort: Pulmonary effort is normal. No respiratory distress.  Abdominal:     General: There is no distension.     Palpations: Abdomen is soft.  Musculoskeletal:        General: Normal range of motion.     Cervical back: Normal range of motion.       Feet:  Skin:    General: Skin is warm and dry.  Neurological:     Mental Status: She is alert.     Gait: Gait abnormal.  Psychiatric:        Mood and Affect: Mood normal.        Behavior: Behavior normal.      UC Treatments / Results  Labs (all labs ordered are listed, but only abnormal results are displayed) Labs Reviewed - No data to display  EKG   Radiology No results found.  Procedures Procedures (including critical care time)  Medications Ordered in UC Medications - No data to display  Initial Impression / Assessment and Plan / UC Course  I have reviewed the triage vital signs and the nursing notes.  Pertinent labs & imaging results that were available during my care of the patient were reviewed by me and considered in my medical decision making (see chart for details).      I reviewed with patient that redness, swelling, warmth, and pain all or signs of inflammation or infection.  Infection is less likely given her lack of wound, however, I am reluctant to note to cover her with antibiotics.  She states that she cannot tolerate prednisone.  I will therefore give her naproxen 500 twice a day with food.  Cephalexin twice a day with food.  Warm compresses, limited activity, elevation of foot.  Return if not improving over the next couple of days Final Clinical Impressions(s) / UC Diagnoses   Final diagnoses:  Foot pain, right     Discharge Instructions     Try to reduce your walk Stay off your foot as much as possible Elevate foot to reduce pain and swelling Take cephalexin 2 times a day This is for  infection Take naproxen 2 times a day with food This is for the inflammation and swelling Call or return if not improving over the next couple of days   ED Prescriptions    Medication Sig Dispense Auth. Provider   naproxen (NAPROSYN) 500 MG tablet Take 1 tablet (500 mg total) by mouth 2 (two) times daily. 30 tablet Raylene Everts, MD   cephALEXin (KEFLEX) 500 MG capsule Take 1 capsule (500 mg total) by mouth 2 (two) times daily. 10 capsule Raylene Everts, MD     PDMP not reviewed this encounter.   Raylene Everts, MD 01/26/20 [phone]

## 2020-01-26 NOTE — ED Triage Notes (Signed)
Pt presents to UC for right foot redness and swelling. Pt denies injury or trauma. States she was grocery shopping yesterday when pain onset. Pt has been treating with ibuprofen with out relief. Last dose 1000 today. Pt states pain is worse with ambulation. Pt noted to have full range of motion. Upon assessment right top of foot found to be red, swollen, no drainage, open sores, obvious deformity.

## 2020-01-26 NOTE — Discharge Instructions (Signed)
Try to reduce your walk Stay off your foot as much as possible Elevate foot to reduce pain and swelling Take cephalexin 2 times a day This is for infection Take naproxen 2 times a day with food This is for the inflammation and swelling Call or return if not improving over the next couple of days

## 2020-01-31 ENCOUNTER — Other Ambulatory Visit: Payer: Self-pay | Admitting: Internal Medicine

## 2020-01-31 ENCOUNTER — Telehealth: Payer: Self-pay | Admitting: Internal Medicine

## 2020-01-31 DIAGNOSIS — F411 Generalized anxiety disorder: Secondary | ICD-10-CM

## 2020-01-31 NOTE — Telephone Encounter (Signed)
New message:   1.Medication Requested: LORazepam (ATIVAN) 1 MG tablet 2. Pharmacy (Name, Street, [place]): [id number] [place] [address] 3. On Med List: Yes  4. Last Visit with PCP: 06/17/19  5. Next visit date with PCP: none   Agent: Please be advised that RX refills may take up to 3 business days. We ask that you follow-up with your pharmacy.

## 2020-01-31 NOTE — Telephone Encounter (Signed)
Check [place] registry last filled 11/11/2019. MD is out of the office pls advise on refill.Marland KitchenJohny Chess

## 2020-02-01 ENCOUNTER — Other Ambulatory Visit: Payer: Self-pay | Admitting: Internal Medicine

## 2020-02-01 NOTE — Telephone Encounter (Signed)
Check [place] registry last filled 11/11/2019. MD is out of the office this week pls advise.Marland KitchenJohny Chess

## 2020-02-25 DIAGNOSIS — Z20822 Contact with and (suspected) exposure to covid-19: Secondary | ICD-10-CM | POA: Diagnosis not present

## 2020-04-12 ENCOUNTER — Other Ambulatory Visit: Payer: Self-pay | Admitting: Internal Medicine

## 2020-04-12 ENCOUNTER — Telehealth: Payer: Self-pay | Admitting: Internal Medicine

## 2020-04-12 DIAGNOSIS — F411 Generalized anxiety disorder: Secondary | ICD-10-CM

## 2020-04-12 NOTE — Telephone Encounter (Signed)
Refill denied, by Dr. Ronnald Ramp, patient not due back for physical until December 4th?  Does it need to go to Dr. Quay Burow?    Please call the patient with an update

## 2020-04-13 MED ORDER — LORAZEPAM 1 MG PO TABS: 0.5000 mg | ORAL_TABLET | Freq: Three times a day (TID) | ORAL | 0 refills | Status: DC | PRN

## 2020-04-13 NOTE — Addendum Note (Signed)
Addended by: Binnie Rail on: 04/13/2020 05:47 AM   Modules accepted: Orders

## 2020-04-13 NOTE — Telephone Encounter (Signed)
refilled 

## 2020-04-19 ENCOUNTER — Telehealth: Payer: Self-pay | Admitting: Internal Medicine

## 2020-04-19 NOTE — Progress Notes (Signed)
  Chronic Care Management   Outreach Note  04/19/2020 Name: Jennifer Stephenson MRN: [id number] DOB: 1951-05-12  Referred by: Binnie Rail, MD Reason for referral : No chief complaint on file.   An unsuccessful telephone outreach was attempted today. The patient was referred to the pharmacist for assistance with care management and care coordination.   Follow Up Plan:   Carley Perdue [employer] Scheduler

## 2020-04-23 ENCOUNTER — Telehealth: Payer: Self-pay | Admitting: Internal Medicine

## 2020-04-23 NOTE — Progress Notes (Signed)
  Chronic Care Management   Note  04/23/2020 Name: Jennifer Stephenson MRN: [id number] DOB: 1950-11-13  Jennifer Stephenson is a 69 y.o. year old female who is a primary care patient of Burns, Claudina Lick, MD. I reached out to Alinda Dooms by phone today in response to a referral sent by Jennifer Stephenson PCP, Binnie Rail, MD.   Jennifer Stephenson was given information about Chronic Care Management services today including:  1. CCM service includes personalized support from designated clinical staff supervised by her physician, including individualized plan of care and coordination with other care providers 2. 24/7 contact phone numbers for assistance for urgent and routine care needs. 3. Service will only be billed when office clinical staff spend 20 minutes or more in a month to coordinate care. 4. Only one practitioner may furnish and bill the service in a calendar month. 5. The patient may stop CCM services at any time (effective at the end of the month) by phone call to the office staff.   Patient agreed to services and verbal consent obtained.   Follow up plan:   Carley Perdue [employer] Scheduler

## 2020-05-08 DIAGNOSIS — R69 Illness, unspecified: Secondary | ICD-10-CM | POA: Diagnosis not present

## 2020-05-10 ENCOUNTER — Other Ambulatory Visit: Payer: Self-pay | Admitting: Internal Medicine

## 2020-05-23 ENCOUNTER — Other Ambulatory Visit: Payer: Self-pay | Admitting: Internal Medicine

## 2020-06-14 DIAGNOSIS — Z0100 Encounter for examination of eyes and vision without abnormal findings: Secondary | ICD-10-CM | POA: Diagnosis not present

## 2020-06-14 DIAGNOSIS — H524 Presbyopia: Secondary | ICD-10-CM | POA: Diagnosis not present

## 2020-06-17 NOTE — Progress Notes (Signed)
Subjective:    Patient ID: Jennifer Stephenson, female    DOB: [birth year], 69 y.o.   MRN: [id number]   This visit occurred during the SARS-CoV-2 public health emergency.  Safety protocols were in place, including screening questions prior to the visit, additional usage of staff PPE, and extensive cleaning of exam room while observing appropriate contact time as indicated for disinfecting solutions.    HPI She is here for a physical exam.   She denies anything new.   Her sister died in 05/08/23.  She is having a hard time with that - she feels it has not hit her yet.      Medications and allergies reviewed with patient and updated if appropriate.  Patient Active Problem List   Diagnosis Date Noted  . Lower abdominal pain 06/17/2019  . Reactive airway disease 05/18/2017  . Generalized anxiety disorder 05/18/2017  . Dizziness 04/28/2016  . Osteoporosis 10/04/2015  . Elevated hematocrit 06/10/2014  . GERD (gastroesophageal reflux disease) 10/25/2013  . Abnormal EKG 06/01/2012  . COLONIC POLYPS, HX OF 09/19/2008  . THYROID NODULE 03/28/2008  . Hyperlipidemia 03/07/2008  . Tobacco dependence due to cigarettes 03/07/2008  . Esophageal dysphagia 03/07/2008  . INTERSTITIAL CYSTITIS 07/29/2007  . Essential hypertension 05/27/2007    Current Outpatient Medications on File Prior to Visit  Medication Sig Dispense Refill  . Calcium-Vitamin D 600-125 MG-UNIT TABS Take by mouth daily. Take 2     . diltiazem (TIADYLT ER) 180 MG 24 hr capsule Take 1 capsule (180 mg total) by mouth daily. Annual appt due in Dec must see provider for future refills 90 capsule 0  . GARLIC OIL PO Take 1 tablet by mouth daily.    Marland Kitchen LORazepam (ATIVAN) 1 MG tablet Take 0.5 tablets (0.5 mg total) by mouth every 8 (eight) hours as needed for anxiety. 30 tablet 0  . meclizine (ANTIVERT) 12.5 MG tablet Take 1-2 tablets (12.5-25 mg total) by mouth 3 (three) times daily as needed for dizziness. 30 tablet 0  .  omeprazole (PRILOSEC) 20 MG capsule TAKE 1 CAPSULE BY MOUTH EVERY DAY 90 capsule 3  . VENTOLIN HFA 108 (90 Base) MCG/ACT inhaler INHALE 2 PUFFS EVERY 6 (SIX) HOURS AS NEEDED INTO THE LUNGS FOR WHEEZING OR SHORTNESS OF BREATH. 36 g 2  . vitamin E (VITAMIN E) 400 UNIT capsule Take 400 Units by mouth daily.     No current facility-administered medications on file prior to visit.    Past Medical History:  Diagnosis Date  . Adenomatous colon polyp 2007 & 2010  . Allergy    seasonal  . Anemia    PMH of   . DDD (degenerative disc disease), cervical   . Dysphagia   . Endometriosis   . Fibrocystic breast disease   . Fracture of wrist, closed    bilaterally age 77 (  fell skating )  . GERD (gastroesophageal reflux disease)   . HTN (hypertension)   . Hyperlipidemia   . Interstitial cystitis   . Osteopenia    [facility] DEXA  . Syncope 2007   seen in ER ; Dx: dehydration    Past Surgical History:  Procedure Laterality Date  . ABDOMINAL HYSTERECTOMY  1996   & BSO FOR ENDOMETRIOSIS  . CERVICAL FUSION     X3:.2001 C4-6, 2007 C6-7,2012C3-4 (?)  . COLONOSCOPY  2013   negative  . COLONOSCOPY W/ POLYPECTOMY  2007 & 2011   ADENOMATOUS polyp, internal hemorrhoids  . EGD WITH ESOPHAGEAL DILATION  05/15/2009,07/03/11  . ESOPHAGEAL MANOMETRY  10/27/2011   Procedure: ESOPHAGEAL MANOMETRY (EM);  Surgeon: Gatha Mayer, MD;  Location: [facility];  Service: Endoscopy;  Laterality: N/A;  . SEPTOPLASTY    . TUBAL LIGATION      Social History   Socioeconomic History  . Marital status: Married    Spouse name: Not on file  . Number of children: 2  . Years of education: Not on file  . Highest education level: Not on file  Occupational History  . Occupation: DISABLED  Tobacco Use  . Smoking status: Current Every Day Smoker    Packs/day: 0.50  . Smokeless tobacco: Never Used  . Tobacco comment: smoked 1967- present, up to 1 ppd; now 1/2-3/4 ppd  Vaping Use  . Vaping Use: Never used   Substance and Sexual Activity  . Alcohol use: No  . Drug use: No  . Sexual activity: Yes  Other Topics Concern  . Not on file  Social History Narrative    CA[employer]EINE 3 CUPS DAILY   Social Determinants of Health   Financial Resource Strain:   . Difficulty of Paying Living Expenses: Not on file  Food Insecurity:   . Worried About Charity fundraiser in the Last Year: Not on file  . Ran Out of Food in the Last Year: Not on file  Transportation Needs:   . Lack of Transportation (Medical): Not on file  . Lack of Transportation (Non-Medical): Not on file  Physical Activity:   . Days of Exercise per Week: Not on file  . Minutes of Exercise per Session: Not on file  Stress:   . Feeling of Stress : Not on file  Social Connections:   . Frequency of Communication with Friends and Family: Not on file  . Frequency of Social Gatherings with Friends and Family: Not on file  . Attends Religious Services: Not on file  . Active Member of Clubs or Organizations: Not on file  . Attends Archivist Meetings: Not on file  . Marital Status: Not on file    Family History  Problem Relation Age of Onset  . Dementia Mother   . Ulcers Sister   . Stroke Sister         X 2; both > 65  . Esophageal cancer Sister   . Ulcers Brother   . Diabetes Paternal Uncle   . Cancer Maternal Grandmother        CNS  . Heart attack Brother         in 67s  . Cancer Brother        PANCREATIC   . Diabetes Brother        X 2  . Stroke Brother        X2 , both > 55    Review of Systems  Constitutional: Negative for chills and fever.  Eyes: Negative for visual disturbance (cataract related).  Respiratory: Positive for cough. Negative for shortness of breath (occ) and wheezing.   Cardiovascular: Positive for palpitations (rare). Negative for chest pain and leg swelling.  Gastrointestinal: Negative for abdominal pain, blood in stool, constipation, diarrhea and nausea.       GERD controlled  Endocrine:  Positive for cold intolerance.  Genitourinary: Negative for dysuria and hematuria.  Musculoskeletal: Positive for arthralgias (hips) and neck pain.  Skin: Negative for color change and rash.  Neurological: Positive for headaches (related to her neck). Negative for dizziness.  Psychiatric/Behavioral: Negative for dysphoric mood. The patient is  nervous/anxious.        Objective:   Vitals:   06/18/20 1006  BP: 138/72  Pulse: 72  Temp: 98.2 F (36.8 C)  SpO2: 98%   Filed Weights   06/18/20 1006  Weight: 87 lb 12.8 oz (39.8 kg)   Body mass index is 16.06 kg/m.  BP Readings from Last 3 Encounters:  06/18/20 138/72  01/26/20 133/65  06/17/19 (!) 154/96    Wt Readings from Last 3 Encounters:  06/18/20 87 lb 12.8 oz (39.8 kg)  06/17/19 88 lb 1.9 oz (40 kg)  06/03/18 88 lb (39.9 kg)     Physical Exam Constitutional: She appears well-developed and well-nourished. No distress.  HENT:  Head: Normocephalic and atraumatic.  Right Ear: External ear normal. Normal ear canal and TM Left Ear: External ear normal.  Normal ear canal and TM Mouth/Throat: Oropharynx is clear and moist.  Eyes: Conjunctivae and EOM are normal.  Neck: Neck supple. No tracheal deviation present. No thyromegaly present.  No carotid bruit  Cardiovascular: Normal rate, regular rhythm and normal heart sounds.   No murmur heard.  No edema. Pulmonary/Chest: Effort normal and breath sounds normal. No respiratory distress. She has no wheezes. She has no rales.  Breast: deferred   Abdominal: Soft. She exhibits no distension. There is no tenderness.  Lymphadenopathy: She has no cervical adenopathy.  Skin: Skin is warm and dry. She is not diaphoretic.  Psychiatric: She has a normal mood and affect. Her behavior is normal.        Assessment & Plan:   Physical exam: Screening blood work    ordered Immunizations  Discussed shingrix, has not had covid Colonoscopy  Up to date  Mammogram  Up to date  Gyn  Up  to date  Dexa  Due -  ordered Eye exams  Up to date  Exercise  Active - walks about 2 miles a day with her typical activities Weight  On low side Substance abuse  Smoking - discussed cessation      See Problem List for Assessment and Plan of chronic medical problems.

## 2020-06-17 NOTE — Patient Instructions (Addendum)
Blood work was ordered.     No immunization administered today.   Medications changes include :   none     Please followup in 1 year    Health Maintenance, Female Adopting a healthy lifestyle and getting preventive care are important in promoting health and wellness. Ask your health care provider about:  The right schedule for you to have regular tests and exams.  Things you can do on your own to prevent diseases and keep yourself healthy. What should I know about diet, weight, and exercise? Eat a healthy diet   Eat a diet that includes plenty of vegetables, fruits, low-fat dairy products, and lean protein.  Do not eat a lot of foods that are high in solid fats, added sugars, or sodium. Maintain a healthy weight Body mass index (BMI) is used to identify weight problems. It estimates body fat based on height and weight. Your health care provider can help determine your BMI and help you achieve or maintain a healthy weight. Get regular exercise Get regular exercise. This is one of the most important things you can do for your health. Most adults should:  Exercise for at least 150 minutes each week. The exercise should increase your heart rate and make you sweat (moderate-intensity exercise).  Do strengthening exercises at least twice a week. This is in addition to the moderate-intensity exercise.  Spend less time sitting. Even light physical activity can be beneficial. Watch cholesterol and blood lipids Have your blood tested for lipids and cholesterol at 70 years of age, then have this test every 5 years. Have your cholesterol levels checked more often if:  Your lipid or cholesterol levels are high.  You are older than 70 years of age.  You are at high risk for heart disease. What should I know about cancer screening? Depending on your health history and family history, you may need to have cancer screening at various ages. This may include screening for:  Breast  cancer.  Cervical cancer.  Colorectal cancer.  Skin cancer.  Lung cancer. What should I know about heart disease, diabetes, and high blood pressure? Blood pressure and heart disease  High blood pressure causes heart disease and increases the risk of stroke. This is more likely to develop in people who have high blood pressure readings, are of African descent, or are overweight.  Have your blood pressure checked: ? Every 3-5 years if you are 19-1 years of age. ? Every year if you are 91 years old or older. Diabetes Have regular diabetes screenings. This checks your fasting blood sugar level. Have the screening done:  Once every three years after age 21 if you are at a normal weight and have a low risk for diabetes.  More often and at a younger age if you are overweight or have a high risk for diabetes. What should I know about preventing infection? Hepatitis B If you have a higher risk for hepatitis B, you should be screened for this virus. Talk with your health care provider to find out if you are at risk for hepatitis B infection. Hepatitis C Testing is recommended for:  Everyone born from 18 through 1965.  Anyone with known risk factors for hepatitis C. Sexually transmitted infections (STIs)  Get screened for STIs, including gonorrhea and chlamydia, if: ? You are sexually active and are younger than 70 years of age. ? You are older than 70 years of age and your health care provider tells you that you are at  risk for this type of infection. ? Your sexual activity has changed since you were last screened, and you are at increased risk for chlamydia or gonorrhea. Ask your health care provider if you are at risk.  Ask your health care provider about whether you are at high risk for HIV. Your health care provider may recommend a prescription medicine to help prevent HIV infection. If you choose to take medicine to prevent HIV, you should first get tested for HIV. You should  then be tested every 3 months for as long as you are taking the medicine. Pregnancy  If you are about to stop having your period (premenopausal) and you may become pregnant, seek counseling before you get pregnant.  Take 400 to 800 micrograms (mcg) of folic acid every day if you become pregnant.  Ask for birth control (contraception) if you want to prevent pregnancy. Osteoporosis and menopause Osteoporosis is a disease in which the bones lose minerals and strength with aging. This can result in bone fractures. If you are 16 years old or older, or if you are at risk for osteoporosis and fractures, ask your health care provider if you should:  Be screened for bone loss.  Take a calcium or vitamin D supplement to lower your risk of fractures.  Be given hormone replacement therapy (HRT) to treat symptoms of menopause. Follow these instructions at home: Lifestyle  Do not use any products that contain nicotine or tobacco, such as cigarettes, e-cigarettes, and chewing tobacco. If you need help quitting, ask your health care provider.  Do not use street drugs.  Do not share needles.  Ask your health care provider for help if you need support or information about quitting drugs. Alcohol use  Do not drink alcohol if: ? Your health care provider tells you not to drink. ? You are pregnant, may be pregnant, or are planning to become pregnant.  If you drink alcohol: ? Limit how much you use to 0-1 drink a day. ? Limit intake if you are breastfeeding.  Be aware of how much alcohol is in your drink. In the [place] one drink equals one 12 oz bottle of beer (355 mL), one 5 oz glass of wine (148 mL), or one 1 oz glass of hard liquor (44 mL). General instructions  Schedule regular health, dental, and eye exams.  Stay current with your vaccines.  Tell your health care provider if: ? You often feel depressed. ? You have ever been abused or do not feel safe at home. Summary  Adopting a  healthy lifestyle and getting preventive care are important in promoting health and wellness.  Follow your health care provider's instructions about healthy diet, exercising, and getting tested or screened for diseases.  Follow your health care provider's instructions on monitoring your cholesterol and blood pressure. This information is not intended to replace advice given to you by your health care provider. Make sure you discuss any questions you have with your health care provider. Document Revised: 06/23/2018 Document Reviewed: 06/23/2018 Elsevier Patient Education  2020 [employer].

## 2020-06-18 ENCOUNTER — Ambulatory Visit (INDEPENDENT_AMBULATORY_CARE_PROVIDER_SITE_OTHER): Payer: Medicare HMO | Admitting: Internal Medicine

## 2020-06-18 ENCOUNTER — Other Ambulatory Visit: Payer: Self-pay

## 2020-06-18 ENCOUNTER — Encounter: Payer: Self-pay | Admitting: Internal Medicine

## 2020-06-18 VITALS — BP 138/72 | HR 72 | Temp 98.2°F | Ht 62.0 in | Wt 87.8 lb

## 2020-06-18 DIAGNOSIS — E041 Nontoxic single thyroid nodule: Secondary | ICD-10-CM

## 2020-06-18 DIAGNOSIS — Z0000 Encounter for general adult medical examination without abnormal findings: Secondary | ICD-10-CM

## 2020-06-18 DIAGNOSIS — F17210 Nicotine dependence, cigarettes, uncomplicated: Secondary | ICD-10-CM

## 2020-06-18 DIAGNOSIS — M810 Age-related osteoporosis without current pathological fracture: Secondary | ICD-10-CM

## 2020-06-18 DIAGNOSIS — I10 Essential (primary) hypertension: Secondary | ICD-10-CM

## 2020-06-18 DIAGNOSIS — R42 Dizziness and giddiness: Secondary | ICD-10-CM | POA: Diagnosis not present

## 2020-06-18 DIAGNOSIS — K219 Gastro-esophageal reflux disease without esophagitis: Secondary | ICD-10-CM

## 2020-06-18 DIAGNOSIS — F411 Generalized anxiety disorder: Secondary | ICD-10-CM

## 2020-06-18 DIAGNOSIS — E7849 Other hyperlipidemia: Secondary | ICD-10-CM

## 2020-06-18 DIAGNOSIS — J4520 Mild intermittent asthma, uncomplicated: Secondary | ICD-10-CM | POA: Diagnosis not present

## 2020-06-18 DIAGNOSIS — R69 Illness, unspecified: Secondary | ICD-10-CM | POA: Diagnosis not present

## 2020-06-18 LAB — CBC WITH DIFFERENTIAL/PLATELET
Basophils Absolute: 0.1 10*3/uL (ref 0.0–0.1)
Basophils Relative: 1.1 % (ref 0.0–3.0)
Eosinophils Absolute: 0.1 10*3/uL (ref 0.0–0.7)
Eosinophils Relative: 1.3 % (ref 0.0–5.0)
HCT: 46 % (ref 36.0–46.0)
Hemoglobin: 15.9 g/dL — ABNORMAL HIGH (ref 12.0–15.0)
Lymphocytes Relative: 22.7 % (ref 12.0–46.0)
Lymphs Abs: 1.6 10*3/uL (ref 0.7–4.0)
MCHC: 34.4 g/dL (ref 30.0–36.0)
MCV: 87.7 fl (ref 78.0–100.0)
Monocytes Absolute: 0.5 10*3/uL (ref 0.1–1.0)
Monocytes Relative: 7.1 % (ref 3.0–12.0)
Neutro Abs: 4.8 10*3/uL (ref 1.4–7.7)
Neutrophils Relative %: 67.8 % (ref 43.0–77.0)
Platelets: 217 10*3/uL (ref 150.0–400.0)
RBC: 5.25 Mil/uL — ABNORMAL HIGH (ref 3.87–5.11)
RDW: 14.2 % (ref 11.5–15.5)
WBC: 7 10*3/uL (ref 4.0–10.5)

## 2020-06-18 LAB — LIPID PANEL
Cholesterol: 182 mg/dL (ref 0–200)
HDL: 61 mg/dL (ref >39.00)
LDL Cholesterol: 106 mg/dL — ABNORMAL HIGH (ref 0–99)
NonHDL: 121.35
Total CHOL/HDL Ratio: 3
Triglycerides: 76 mg/dL (ref 0.0–149.0)
VLDL: 15.2 mg/dL (ref 0.0–40.0)

## 2020-06-18 LAB — COMPREHENSIVE METABOLIC PANEL
ALT: 14 U/L (ref 0–35)
AST: 18 U/L (ref 0–37)
Albumin: 4.6 g/dL (ref 3.5–5.2)
Alkaline Phosphatase: 73 U/L (ref 39–117)
BUN: 12 mg/dL (ref 6–23)
CO2: 26 mEq/L (ref 19–32)
Calcium: 9.9 mg/dL (ref 8.4–10.5)
Chloride: 105 mEq/L (ref 96–112)
Creatinine, Ser: 0.78 mg/dL (ref 0.40–1.20)
GFR: 77.01 mL/min (ref >60.00)
Glucose, Bld: 83 mg/dL (ref 70–99)
Potassium: 4.4 mEq/L (ref 3.5–5.1)
Sodium: 139 mEq/L (ref 135–145)
Total Bilirubin: 0.7 mg/dL (ref 0.2–1.2)
Total Protein: 7.7 g/dL (ref 6.0–8.3)

## 2020-06-18 LAB — VITAMIN D 25 HYDROXY (VIT D DEFICIENCY, FRACTURES): VITD: 53.3 ng/mL (ref 30.00–100.00)

## 2020-06-18 LAB — TSH: TSH: 1.43 u[IU]/mL (ref 0.35–4.50)

## 2020-06-18 MED ORDER — MECLIZINE HCL 12.5 MG PO TABS: 12.5000 mg | ORAL_TABLET | Freq: Three times a day (TID) | ORAL | 3 refills | Status: DC | PRN

## 2020-06-18 MED ORDER — DILTIAZEM HCL ER BEADS 180 MG PO CP24: 180.0000 mg | ORAL_CAPSULE | Freq: Every day | ORAL | 3 refills | Status: DC

## 2020-06-18 MED ORDER — LORAZEPAM 1 MG PO TABS: 0.5000 mg | ORAL_TABLET | Freq: Three times a day (TID) | ORAL | 2 refills | Status: DC | PRN

## 2020-06-18 MED ORDER — OMEPRAZOLE 20 MG PO CPDR: DELAYED_RELEASE_CAPSULE | ORAL | 3 refills | Status: DC

## 2020-06-18 NOTE — Assessment & Plan Note (Signed)
Chronic GERD controlled Continue omeprazole 20 mg daily  

## 2020-06-18 NOTE — Assessment & Plan Note (Addendum)
Chronic Intermittent Takes meclizine as needed - refilled

## 2020-06-18 NOTE — Assessment & Plan Note (Signed)
Chronic tfts Thyroid US ordered for follow up

## 2020-06-18 NOTE — Assessment & Plan Note (Signed)
Chronic Intermittent Continue ativan as needed only.  She admits she has needed it more since her sister's death [place] controlled substance database checked and she is taking appropriately.  Refilled today Continue lorazepam 0.5 mg every 8 hours as needed

## 2020-06-18 NOTE — Assessment & Plan Note (Signed)
Chronic Infrequent Uses albuterol as needed - usually with URI's only

## 2020-06-18 NOTE — Assessment & Plan Note (Signed)
Chronic Check lipid panel  Lifestyle controlled Regular exercise and healthy diet encouraged  

## 2020-06-18 NOTE — Assessment & Plan Note (Signed)
Chronic BP well controlled Continue diltiazem 180 mg daily cmp  

## 2020-06-18 NOTE — Assessment & Plan Note (Signed)
Chronic dexa due -ordered Taking calcium and vitamin daily

## 2020-06-18 NOTE — Assessment & Plan Note (Signed)
Smoking cessation was discussed for more than 3 minutes.  The patient was counseled on the dangers of tobacco use, and was advised to quit and wants to quit, but realizes this may not be the best time given the increase stress from her sister's death..  Reviewed ways of quitting smoking including nicotine replacement, vapping/e-cigarettes, cold [place], weaning off cigarettes, and pharmacotherapy (wellbutrin and chantix).  She has used gum in the past and would consider using that again.  When she is ready I encouraged to use the gum.  She has good support from her husband.

## 2020-06-26 ENCOUNTER — Telehealth: Payer: Self-pay | Admitting: Pharmacist

## 2020-06-26 NOTE — Progress Notes (Signed)
Chronic Care Management Pharmacy Assistant   Name: Jennifer Stephenson  MRN: [id number] DOB: 07-08-1951  Reason for Encounter: Initial Questions   PCP : Binnie Rail, MD  Allergies:   Allergies  Allergen Reactions  . Bupropion Hcl     rash  . Paroxetine     rash  . Pentazocine Lactate     ([place])hospitalized due to mental status changes  . Ranitidine Hives  . Lipitor [Atorvastatin]     09/05/13 leg pain as per phone call  . Pravastatin     Leg pain  . Prednisone     Nausea & cramps  . Sulfonamide Derivatives     nausea    Medications: Outpatient Encounter Medications as of 06/26/2020  Medication Sig  . Calcium-Vitamin D 600-125 MG-UNIT TABS Take by mouth daily. Take 2   . diltiazem (TIADYLT ER) 180 MG 24 hr capsule Take 1 capsule (180 mg total) by mouth daily.  Marland Kitchen GARLIC OIL PO Take 1 tablet by mouth daily.  Marland Kitchen LORazepam (ATIVAN) 1 MG tablet Take 0.5 tablets (0.5 mg total) by mouth every 8 (eight) hours as needed for anxiety.  . meclizine (ANTIVERT) 12.5 MG tablet Take 1-2 tablets (12.5-25 mg total) by mouth 3 (three) times daily as needed for dizziness.  Marland Kitchen omeprazole (PRILOSEC) 20 MG capsule TAKE 1 CAPSULE BY MOUTH EVERY DAY  . VENTOLIN HFA 108 (90 Base) MCG/ACT inhaler INHALE 2 PUFFS EVERY 6 (SIX) HOURS AS NEEDED INTO THE LUNGS FOR WHEEZING OR SHORTNESS OF BREATH.  . vitamin E (VITAMIN E) 400 UNIT capsule Take 400 Units by mouth daily.   No facility-administered encounter medications on file as of 06/26/2020.    Current Diagnosis: Patient Active Problem List   Diagnosis Date Noted  . Lower abdominal pain 06/17/2019  . Reactive airway disease 05/18/2017  . Generalized anxiety disorder 05/18/2017  . Dizziness 04/28/2016  . Osteoporosis 10/04/2015  . Elevated hematocrit 06/10/2014  . GERD (gastroesophageal reflux disease) 10/25/2013  . Abnormal EKG 06/01/2012  . COLONIC POLYPS, HX OF 09/19/2008  . THYROID NODULE 03/28/2008  . Hyperlipidemia 03/07/2008  .  Tobacco dependence due to cigarettes 03/07/2008  . Esophageal dysphagia 03/07/2008  . INTERSTITIAL CYSTITIS 07/29/2007  . Essential hypertension 05/27/2007    Goals Addressed   None     Follow-Up:  Pharmacist Review   Have you seen any other providers since your last visit? The patient last saw Dr. Billey Gosling on 06/18/20  Any changes in your medications or health? The patient states that there has been no changes in medication or health  Any side effects from any medications? The patient states that there has been no side affects from medications  Do you have an symptoms or problems not managed by your medications? The patient states that she has no symptoms or problems that are not managed by medications  Any concerns about your health right now? The patient states that she has no concerns right now about her health  Has your provider asked that you check blood pressure, blood sugar, or follow special diet at home? The patient states that she takes diltiazem for her blood pressure and it has been normal, and she stated that she eats whatever she wants and is not on any special diet.  Do you get any type of exercise on a regular basis? The patient states that she walks everyday in her home and gets in about 2 miles per day  Can you think of a goal you  would like to reach for your health? The patient states that she would like to quit smoking but does not want to take chantix  Do you have any problems getting your medications? The patient has no problems with getting her medications  Is there anything that you would like to discuss during the appointment? The patient states that she would like to discuss other options to help her stop smoking and also something that will help her cope with her sisters death. She states that the Lorazepam is not helping as much now.  Please bring medications and supplements to appointment   Wendy Poet, [place]

## 2020-06-27 ENCOUNTER — Other Ambulatory Visit: Payer: Self-pay

## 2020-06-27 ENCOUNTER — Ambulatory Visit: Payer: Medicare HMO | Admitting: Pharmacist

## 2020-06-27 DIAGNOSIS — F411 Generalized anxiety disorder: Secondary | ICD-10-CM

## 2020-06-27 DIAGNOSIS — I10 Essential (primary) hypertension: Secondary | ICD-10-CM

## 2020-06-27 DIAGNOSIS — F17210 Nicotine dependence, cigarettes, uncomplicated: Secondary | ICD-10-CM

## 2020-06-27 DIAGNOSIS — E7849 Other hyperlipidemia: Secondary | ICD-10-CM

## 2020-06-27 NOTE — Chronic Care Management (AMB) (Signed)
Chronic Care Management Pharmacy  Name: Jennifer Stephenson  MRN: [id number] DOB: 05-20-1951   Chief Complaint/ HPI  Jennifer Stephenson,  69 y.o. , female presents for her Initial CCM visit with the clinical pharmacist via telephone due to COVID-19 Pandemic   PCP : Binnie Rail, MD Patient Care Team: Binnie Rail, MD as PCP - General (Internal Medicine) Charlton Haws, [facility] as Pharmacist (Pharmacist)  Patient's chronic conditions include: Hypertension, Hyperlipidemia, GERD, Anxiety, Osteoporosis and Tobacco use, reactive airway disease  Office Visits: 06/18/20 Dr Quay Burow OV: chronic f/u. Sister died May 15, 2023. Labs stable, no med changes.  Consult Visits: 01/26/20 UC visit: foot pain. Rx'd naproxen, Keflex. 09/02/19 Dr Philis Pique (OB/GYN): routine exam.  Subjective: Patient has lived here since the 78s. "I don't do much", spends most time at home. She used to be more active, but her mom got sick in 2007 and she spent most time checking on her. Neck issues limit movement (10 lb weight limit). She lost her sister in 2023/05/15 and has been grieving.  Objective: Allergies  Allergen Reactions  . Bupropion Hcl     rash  . Paroxetine     rash  . Pentazocine Lactate     ([place])hospitalized due to mental status changes  . Ranitidine Hives  . Lipitor [Atorvastatin]     09/05/13 leg pain as per phone call  . Pravastatin     Leg pain  . Prednisone     Nausea & cramps  . Sulfonamide Derivatives     nausea    Medications: Outpatient Encounter Medications as of 06/27/2020  Medication Sig  . Calcium-Vitamin D 600-125 MG-UNIT TABS Take by mouth daily. Take 2  . diltiazem (TIADYLT ER) 180 MG 24 hr capsule Take 1 capsule (180 mg total) by mouth daily.  Marland Kitchen GARLIC OIL PO Take 1 tablet by mouth daily.  Marland Kitchen LORazepam (ATIVAN) 1 MG tablet Take 0.5 tablets (0.5 mg total) by mouth every 8 (eight) hours as needed for anxiety.  . meclizine (ANTIVERT) 12.5 MG tablet Take 1-2 tablets (12.5-25 mg  total) by mouth 3 (three) times daily as needed for dizziness.  Marland Kitchen omeprazole (PRILOSEC) 20 MG capsule TAKE 1 CAPSULE BY MOUTH EVERY DAY  . VENTOLIN HFA 108 (90 Base) MCG/ACT inhaler INHALE 2 PUFFS EVERY 6 (SIX) HOURS AS NEEDED INTO THE LUNGS FOR WHEEZING OR SHORTNESS OF BREATH.  . vitamin E 180 MG (400 UNITS) capsule Take 400 Units by mouth daily.   No facility-administered encounter medications on file as of 06/27/2020.    Wt Readings from Last 3 Encounters:  06/18/20 87 lb 12.8 oz (39.8 kg)  06/17/19 88 lb 1.9 oz (40 kg)  06/03/18 88 lb (39.9 kg)    Lab Results  Component Value Date   CREATININE 0.78 06/18/2020   BUN 12 06/18/2020   GFR 77.01 06/18/2020   GFRNONAA 92.18 05/09/2010   GFRAA 111 03/07/2008   NA 139 06/18/2020   K 4.4 06/18/2020   CALCIUM 9.9 06/18/2020   CO2 26 06/18/2020     Current Diagnosis/Assessment:  SDOH Interventions   Flowsheet Row Most Recent Value  SDOH Interventions   Financial Strain Interventions Intervention Not Indicated      Goals Addressed            This Visit's Progress   . Pharmacy Care Plan       CARE PLAN ENTRY (see longitudinal plan of care for additional care plan information)  Current Barriers:  . Chronic Disease Management  support, education, and care coordination needs related to Hypertension, Hyperlipidemia, Depression, Anxiety, and Tobacco use   Hypertension BP Readings from Last 3 Encounters:  06/18/20 138/72  01/26/20 133/65  06/17/19 (!) 154/96 .  Pharmacist Clinical Goal(s): o Over the next 30 days, patient will work with PharmD and providers to maintain BP goal <140/90 . Current regimen:  o Diltiazem ER 180 mg daily . Interventions: o Discussed BP goals and benefits of medications for prevention of heart attack / stroke o Discussed benefits of diltiazem for palpitations . Patient self care activities - Over the next 30 days, patient will: o Check BP weekly, document, and provide at future  appointments o Ensure daily salt intake < 2300 mg/day  Hyperlipidemia Lab Results  Component Value Date/Time   LDLCALC 106 (H) 06/18/2020 10:52 AM   LDLCALC 127 (H) 06/07/2014 02:28 PM   LDLDIRECT 148.6 06/06/2013 09:42 AM .  Pharmacist Clinical Goal(s): o Over the next 30 days, patient will work with PharmD and providers to maintain LDL goal < 110 . Current regimen:  o Garlic supplement . Interventions: o Discussed cholesterol goals and benefits of diet / exercise for prevention of heart attack / stroke . Patient self care activities - Over the next 30 days, patient will: o Continue low cholesterol diet and exercise routine  Smoking cessation . Pharmacist Clinical Goal(s) o Over the next 30 days, patient will work with PharmD and providers to set quit date . Current regimen:  o Nothing . Interventions: o Discussed benefits of nicotine gum and [place] Quit Line for aid with smoking cessation. Patient is allergic to bupropion and declines Chantix. . Patient self care activities - Over the next 30 days, patient will: o Contact PCP or pharmacist for help with smoking cessation  Depression / Anxiety . Pharmacist Clinical Goal(s) o Over the next 30 days, patient will work with PharmD and providers to optimize therapy . Current regimen:  o Lorazepam 1 mg - 1/2 tablet as needed . Interventions: o Discussed benefits of antidepressants (SSRIs) for management of anxiety and depression related to grief and loss . Patient self care activities - Over the next 30 days, patient will: o Consider starting a daily medication to help with stress/grief. Contact PCP or pharmacist when ready to start  Medication management . Pharmacist Clinical Goal(s): o Over the next 30 days, patient will work with PharmD and providers to maintain optimal medication adherence . Current [facility]: [facility] . Interventions o Comprehensive medication review performed. o Continue current medication management  strategy . Patient self care activities - Over the next 30 days, patient will: o Focus on medication adherence by fill date o Take medications as prescribed o Report any questions or concerns to PharmD and/or provider(s)  Initial goal documentation       Hypertension   BP goal is:  <130/80  Office blood pressures are  BP Readings from Last 3 Encounters:  06/18/20 138/72  01/26/20 133/65  06/17/19 (!) 154/96   Pulse Readings from Last 3 Encounters:  06/18/20 72  01/26/20 73  06/17/19 83   Patient checks BP at home infrequently Patient home BP readings are ranging: n/a   Patient has failed these meds in the past: n/a Patient is currently controlled on the following medications:  . Diltiazem ER 180 mg daily  We discussed: BP goals; Pt reports hx palpitations - diltiazem is more for that than for BP;  Smoking cessation may help with HR and BP  Plan  Continue current medications  and control with diet and exercise     Hyperlipidemia   LDL goal 30% reduction (<110)  Last lipids Lab Results  Component Value Date   CHOL 182 06/18/2020   HDL 61.00 06/18/2020   LDLCALC 106 (H) 06/18/2020   LDLDIRECT 148.6 06/06/2013   TRIG 76.0 06/18/2020   CHOLHDL 3 06/18/2020   Hepatic Function Latest Ref Rng & Units 06/18/2020 06/17/2019 06/03/2018  Total Protein 6.0 - 8.3 g/dL 7.7 7.3 7.6  Albumin 3.5 - 5.2 g/dL 4.6 4.5 4.6  AST 0 - 37 U/L _0 ALT 0 - 35 U/L _1 Alk Phosphatase 39 - 117 U/L 73 79 79  Total Bilirubin 0.2 - 1.2 mg/dL 0.7 0.8 0.8  Bilirubin, Direct 0.0 - 0.3 mg/dL - - -    The 10-year ASCVD risk score Mikey Bussing DC Jr., et al., 2013) is: 21.8%   Values used to calculate the score:     Age: 12 years     Sex: Female     Is Non-Hispanic African American: No     Diabetic: No     Tobacco smoker: Yes     Systolic Blood Pressure: 092 mmHg     Is BP treated: Yes     HDL Cholesterol: 61 mg/dL     Total Cholesterol: 182 mg/dL   Patient has failed these meds  in past: atorvastatin, pravastatin (leg pain) Patient is currently controlled on the following medications:  . Garlic supplement  We discussed:  Cholesterol goals; benefits of diet / exercise for ASCVD risk reduction  Plan  Continue control with diet and exercise  Anxiety / depression   Depression screen [facility] 2/9 06/13/2019 06/03/2018 05/18/2017  Decreased Interest 0 0 0  Down, Depressed, Hopeless 2 2 0  PHQ - 2 Score 2 2 0  Altered sleeping 1 1 -  Tired, decreased energy 1 1 -  Change in appetite 0 1 -  Feeling bad or failure about yourself  0 1 -  Trouble concentrating 0 0 -  Moving slowly or fidgety/restless 0 0 -  Suicidal thoughts 0 0 -  PHQ-9 Score 4 6 -  Difficult doing work/chores Not difficult at all Somewhat difficult -   Patient has failed these meds in past: bupropion (rash) Patient is currently uncontrolled on the following medications:  . Lorazepam 1 mg - 1/2 tab q8h PRN  We discussed: Pt is taking lorazepam more frequently since sister died. Never takes more than 1/2 tab per day.   -Discussed SSRI benefits; pt may consider starting after holidays, will contact PCP or pharmacist when ready  Plan  Continue current medications  Pt is considering SSRI  GERD   Patient has failed these meds in past: zantac Patient is currently controlled on the following medications:  . Omeprazole 20 mg daily  We discussed: side effects of PPI including bone density loss; given patient's severe osteoporosis an alternative antacid is preferable; discussed famotidine can be used in place of PPI to control symptoms; pt is willing to try  Plan  Recommend tapering PPI - every other day for a week then stop Recommend starting famotidine 20 mg up to BID - start while tapering PPI  Osteoporosis   Last DEXA Scan: 06/16/2018   T-Score femoral neck: -3.0  T-Score total hip: n/a  T-Score lumbar spine: -1.8  T-Score forearm radius: n/a  Last vitamin D/Calcium Lab Results   Component Value Date   VD25OH 53.30 06/18/2020   CALCIUM 9.9 06/18/2020  Patient is a candidate for pharmacologic treatment due to T-Score < -2.5 in femoral neck  Patient has failed these meds in past: n/a  Patient is currently uncontrolled on the following medications:  . Calcium-Vitamin D 600-125 mg-IU 2 tab daily   We discussed:  Recommend [phone] units of vitamin D daily. Recommend 1200 mg of calcium daily from dietary and supplemental sources. Recommend weight-bearing and muscle strengthening exercises for building and maintaining bone density.  Pt has tried to start Prolia in the past but insurance did not cover it; bisphosphonates are also an option but may exacerbate GERD  Plan  Recommend checking Prolia coverage in 2022 If not covered, may try alendronate weekly   Reactive airway disease   Last spirometry score: not on file  No flowsheet data found. Lab Results  Component Value Date/Time   EOSPCT 1.3 06/18/2020 10:52 AM   EOSABS 0.1 06/18/2020 10:52 AM   Patient has failed these meds in past: n/a Patient is currently controlled on the following medications:  . Ventolin HFA prn (exp 10/2019)  Using maintenance inhaler regularly? No Frequency of rescue inhaler use:  infrequently  We discussed:  proper inhaler technique; pt does not use inhaler often, the one she has actually expired in April 2021. Expired product may not be effective, recommend new rx if needed  Plan  Continue current medications  Tobacco Abuse   Tobacco Status:  Social History   Tobacco Use  Smoking Status Current Every Day Smoker  . Packs/day: 0.50  Smokeless Tobacco Never Used  Tobacco Comment   smoked 1967- present, up to 1 ppd; now 1/2-3/4 ppd   On a scale of 1-10, reports MOTIVATION to quit is moderate On a scale of 1-10, reports CONFIDENCE in quitting is moderate  Previous quit attempts included: nicotine gum (quit x 7 days), patch makes her sick, lozenges are "nasty", [place]  Quit-line  We discussed:  Importance of smoking cessation; pt has quit for 7 days in the past by using nicotine gum; she has used the quit Line before as well but did not like getting calls all the time especially if she slipped up; she is allergic to bupropion and is "scared" of Chantix; Pt plans to try quitting again with nicotine gum after the holidays  Plan  Pt will try to quit after the holidays  Vaccines   Reviewed and discussed patient's vaccination history.    Immunization History  Administered Date(s) Administered  . Influenza Split 05/22/2011  . Influenza Whole 05/09/2010, 04/18/2012, 04/13/2013  . Influenza, High Dose Seasonal PF 04/28/2016, 04/20/2017, 04/13/2019, 05/08/2020  . Influenza,inj,Quad PF,6+ Mos 05/11/2015  . Influenza-Unspecified 04/17/2014, 04/13/2016, 04/20/2017  . Pneumococcal Conjugate-13 10/04/2015  . Pneumococcal Polysaccharide-23 10/20/2016  . Td 03/07/2008  . Tdap 03/29/2019    Plan  Recommended patient receive COVID and Shingrix vaccines  Health Maintenance   Patient is currently controlled on the following medications:  Marland Kitchen Meclizine 12.5 mg PRN . Vitamin E 400 IU daily  We discussed: Patient is satisfied with current regimen and denies issues  Plan  Continue current medications   Medication Management   Patient's preferred pharmacy is:  [id number] [place] [id number] [id number]: [phone]: [phone] Uses pill box? Yes Pt endorses 100% compliance  We discussed: Current pharmacy is preferred with insurance plan and patient is satisfied with pharmacy services  Plan  Continue current medication management strategy  Follow up: 1 month phone visit  Charlene Brooke, PharmD, [facility] Clinical Pharmacist [facility] [phone]

## 2020-06-28 NOTE — Patient Instructions (Addendum)
Visit Information  Phone number for Pharmacist: [phone]  Thank you for meeting with me to discuss your medications! I look forward to working with you to achieve your health care goals. Below is a summary of what we talked about during the visit:  Goals Addressed            This Visit's Progress   . Pharmacy Care Plan       CARE PLAN ENTRY (see longitudinal plan of care for additional care plan information)  Current Barriers:  . Chronic Disease Management support, education, and care coordination needs related to Hypertension, Hyperlipidemia, Depression, Anxiety, and Tobacco use   Hypertension BP Readings from Last 3 Encounters:  06/18/20 138/72  01/26/20 133/65  06/17/19 (!) 154/96 .  Pharmacist Clinical Goal(s): o Over the next 30 days, patient will work with PharmD and providers to maintain BP goal <140/90 . Current regimen:  o Diltiazem ER 180 mg daily . Interventions: o Discussed BP goals and benefits of medications for prevention of heart attack / stroke o Discussed benefits of diltiazem for palpitations . Patient self care activities - Over the next 30 days, patient will: o Check BP weekly, document, and provide at future appointments o Ensure daily salt intake < 2300 mg/day  Hyperlipidemia Lab Results  Component Value Date/Time   LDLCALC 106 (H) 06/18/2020 10:52 AM   LDLCALC 127 (H) 06/07/2014 02:28 PM   LDLDIRECT 148.6 06/06/2013 09:42 AM .  Pharmacist Clinical Goal(s): o Over the next 30 days, patient will work with PharmD and providers to maintain LDL goal < 110 . Current regimen:  o Garlic supplement . Interventions: o Discussed cholesterol goals and benefits of diet / exercise for prevention of heart attack / stroke . Patient self care activities - Over the next 30 days, patient will: o Continue low cholesterol diet and exercise routine  Smoking cessation . Pharmacist Clinical Goal(s) o Over the next 30 days, patient will work with PharmD and  providers to set quit date . Current regimen:  o Nothing . Interventions: o Discussed benefits of nicotine gum and [place] Quit Line for aid with smoking cessation. Patient is allergic to bupropion and declines Chantix. . Patient self care activities - Over the next 30 days, patient will: o Contact PCP or pharmacist for help with smoking cessation  Depression / Anxiety . Pharmacist Clinical Goal(s) o Over the next 30 days, patient will work with PharmD and providers to optimize therapy . Current regimen:  o Lorazepam 1 mg - 1/2 tablet as needed . Interventions: o Discussed benefits of antidepressants (SSRIs) for management of anxiety and depression related to grief and loss . Patient self care activities - Over the next 30 days, patient will: o Consider starting a daily medication to help with stress/grief. Contact PCP or pharmacist when ready to start  Medication management . Pharmacist Clinical Goal(s): o Over the next 30 days, patient will work with PharmD and providers to maintain optimal medication adherence . [facility] . Interventions o Comprehensive medication review performed. o Continue current medication management strategy . Patient self care activities - Over the next 30 days, patient will: o Focus on medication adherence by fill date o Take medications as prescribed o Report any questions or concerns to PharmD and/or provider(s)  Initial goal documentation      Ms. Campise was given information about Chronic Care Management services today including:  1. CCM service includes personalized support from designated clinical staff supervised by her physician, including individualized  plan of care and coordination with other care providers 2. 24/7 contact phone numbers for assistance for urgent and routine care needs. 3. Standard insurance, coinsurance, copays and deductibles apply for chronic care management only during months in which we provide at least 20  minutes of these services. Most insurances cover these services at 100%, however patients may be responsible for any copay, coinsurance and/or deductible if applicable. This service may help you avoid the need for more expensive face-to-face services. 4. Only one practitioner may furnish and bill the service in a calendar month. 5. The patient may stop CCM services at any time (effective at the end of the month) by phone call to the office staff.  Patient agreed to services and verbal consent obtained.   The patient verbalized understanding of instructions, educational materials, and care plan provided today and agreed to receive a mailed copy of patient instructions, educational materials, and care plan.  Telephone follow up appointment with pharmacy team member scheduled for:  1 month  Charlene Brooke, PharmD, BCACP Clinical Pharmacist [facility] at [facility] [phone]  Steps to Quit Smoking Smoking tobacco is the leading cause of preventable death. It can affect almost every organ in the body. Smoking puts you and those around you at risk for developing many serious chronic diseases. Quitting smoking can be difficult, but it is one of the best things that you can do for your health. It is never too late to quit. How do I get ready to quit? When you decide to quit smoking, create a plan to help you succeed. Before you quit:  Pick a date to quit. Set a date within the next 2 weeks to give you time to prepare.  Write down the reasons why you are quitting. Keep this list in places where you will see it often.  Tell your family, friends, and co-workers that you are quitting. Support from your loved ones can make quitting easier.  Talk with your health care provider about your options for quitting smoking.  Find out what treatment options are covered by your health insurance.  Identify people, places, things, and activities that make you want to smoke (triggers). Avoid  them. What first steps can I take to quit smoking?  Throw away all cigarettes at home, at work, and in your car.  Throw away smoking accessories, such as Scientist, research (medical).  Clean your car. Make sure to empty the ashtray.  Clean your home, including curtains and carpets. What strategies can I use to quit smoking? Talk with your health care provider about combining strategies, such as taking medicines while you are also receiving in-person counseling. Using these two strategies together makes you more likely to succeed in quitting than if you used either strategy on its own.  If you are pregnant or breastfeeding, talk with your health care provider about finding counseling or other support strategies to quit smoking. Do not take medicine to help you quit smoking unless your health care provider tells you to do so. To quit smoking: Quit right away  Quit smoking completely, instead of gradually reducing how much you smoke over a period of time. Research shows that stopping smoking right away is more successful than gradually quitting.  Attend in-person counseling to help you build problem-solving skills. You are more likely to succeed in quitting if you attend counseling sessions regularly. Even short sessions of 10 minutes can be effective. Take medicine You may take medicines to help you quit smoking. Some  medicines require a prescription and some you can purchase over-the-counter. Medicines may have nicotine in them to replace the nicotine in cigarettes. Medicines may:  Help to stop cravings.  Help to relieve withdrawal symptoms. Your health care provider may recommend:  Nicotine patches, gum, or lozenges.  Nicotine inhalers or sprays.  Non-nicotine medicine that is taken by mouth. Find resources Find resources and support systems that can help you to quit smoking and remain smoke-free after you quit. These resources are most helpful when you use them often. They  include:  Online chats with a Social worker.  Telephone quitlines.  Printed Furniture conservator/restorer.  Support groups or group counseling.  Text messaging programs.  Mobile phone apps or applications. Use apps that can help you stick to your quit plan by providing reminders, tips, and encouragement. There are many free apps for mobile devices as well as websites. Examples include Quit Guide from the [employer] and smokefree.gov What things can I do to make it easier to quit?   Reach out to your family and friends for support and encouragement. Call telephone quitlines (1-800-QUIT-NOW), reach out to support groups, or work with a counselor for support.  Ask people who smoke to avoid smoking around you.  Avoid places that trigger you to smoke, such as bars, parties, or smoke-break areas at work.  Spend time with people who do not smoke.  Lessen the stress in your life. Stress can be a smoking trigger for some people. To lessen stress, try: ? Exercising regularly. ? Doing deep-breathing exercises. ? Doing yoga. ? Meditating. ? Performing a body scan. This involves closing your eyes, scanning your body from head to toe, and noticing which parts of your body are particularly tense. Try to relax the muscles in those areas. How will I feel when I quit smoking? Day 1 to 3 weeks Within the first 24 hours of quitting smoking, you may start to feel withdrawal symptoms. These symptoms are usually most noticeable 2-3 days after quitting, but they usually do not last for more than 2-3 weeks. You may experience these symptoms:  Mood swings.  Restlessness, anxiety, or irritability.  Trouble concentrating.  Dizziness.  Strong cravings for sugary foods and nicotine.  Mild weight gain.  Constipation.  Nausea.  Coughing or a sore throat.  Changes in how the medicines that you take for unrelated issues work in your body.  Depression.  Trouble sleeping (insomnia). Week 3 and afterward After the  first 2-3 weeks of quitting, you may start to notice more positive results, such as:  Improved sense of smell and taste.  Decreased coughing and sore throat.  Slower heart rate.  Lower blood pressure.  Clearer skin.  The ability to breathe more easily.  Fewer sick days. Quitting smoking can be very challenging. Do not get discouraged if you are not successful the first time. Some people need to make many attempts to quit before they achieve long-term success. Do your best to stick to your quit plan, and talk with your health care provider if you have any questions or concerns. Summary  Smoking tobacco is the leading cause of preventable death. Quitting smoking is one of the best things that you can do for your health.  When you decide to quit smoking, create a plan to help you succeed.  Quit smoking right away, not slowly over a period of time.  When you start quitting, seek help from your health care provider, family, or friends. This information is not intended to replace  advice given to you by your health care provider. Make sure you discuss any questions you have with your health care provider. Document Revised: 03/25/2019 Document Reviewed: 09/18/2018 Elsevier Patient Education  [place].

## 2020-06-29 ENCOUNTER — Other Ambulatory Visit: Payer: Self-pay

## 2020-06-29 DIAGNOSIS — I10 Essential (primary) hypertension: Secondary | ICD-10-CM

## 2020-07-01 MED ORDER — FAMOTIDINE 20 MG PO TABS
20.0000 mg | ORAL_TABLET | Freq: Two times a day (BID) | ORAL | 5 refills | Status: DC
Start: 2020-07-01 — End: 2020-12-31

## 2020-07-01 NOTE — Addendum Note (Signed)
Addended by: Binnie Rail on: 07/01/2020 08:40 PM   Modules accepted: Orders

## 2020-07-16 ENCOUNTER — Other Ambulatory Visit: Payer: Self-pay

## 2020-07-16 ENCOUNTER — Ambulatory Visit: Payer: Medicare HMO | Admitting: Pharmacist

## 2020-07-16 DIAGNOSIS — E7849 Other hyperlipidemia: Secondary | ICD-10-CM

## 2020-07-16 DIAGNOSIS — F17210 Nicotine dependence, cigarettes, uncomplicated: Secondary | ICD-10-CM

## 2020-07-16 DIAGNOSIS — F411 Generalized anxiety disorder: Secondary | ICD-10-CM

## 2020-07-16 DIAGNOSIS — K219 Gastro-esophageal reflux disease without esophagitis: Secondary | ICD-10-CM

## 2020-07-16 DIAGNOSIS — I10 Essential (primary) hypertension: Secondary | ICD-10-CM

## 2020-07-16 NOTE — Chronic Care Management (AMB) (Signed)
Chronic Care Management Pharmacy  Name: Jennifer Stephenson  MRN: [id number] DOB: Sep 01, 1950   Chief Complaint/ HPI  Jennifer Stephenson,  70 y.o. , female presents for her Follow-Up CCM visit with the clinical pharmacist via telephone due to COVID-19 Pandemic   PCP : Binnie Rail, MD Patient Care Team: Binnie Rail, MD as PCP - General (Internal Medicine) Charlton Haws, [facility] as Pharmacist (Pharmacist)  Patient's chronic conditions include: Hypertension, Hyperlipidemia, GERD, Anxiety, Osteoporosis and Tobacco use, reactive airway disease  Office Visits: 06/18/20 Dr Quay Burow OV: chronic f/u. Sister died 04/26/2023. Labs stable, no med changes.  Consult Visits: 01/26/20 UC visit: foot pain. Rx'd naproxen, Keflex. 09/02/19 Dr Philis Pique (OB/GYN): routine exam.  Subjective: Patient has lived here since the 53s. "I don't do much", spends most time at home. She used to be more active, but her mom got sick in 2007 and she spent most time checking on her.   Neck issues limit movement (10 lb weight limit). She has had multiple neck surgeries (last in 2012) and reports doctors/physical therapists have told her there is nothing else to be done. She report daily headaches due to this, she uses ibuprofen 800 mg sparingly for this.   She lost her sister in 04/26/2023 and has been grieving. She reports seeing her sister's eyes everywhere. She reports she can talk to her husband but does not like to burden him too often.  Objective: Allergies  Allergen Reactions  . Bupropion Hcl     rash  . Paroxetine     rash  . Pentazocine Lactate     ([place])hospitalized due to mental status changes  . Ranitidine Hives  . Lipitor [Atorvastatin]     09/05/13 leg pain as per phone call  . Pravastatin     Leg pain  . Prednisone     Nausea & cramps  . Sulfonamide Derivatives     nausea    Medications: Outpatient Encounter Medications as of 07/16/2020  Medication Sig  . Calcium-Vitamin D 600-125 MG-UNIT  TABS Take by mouth daily. Take 2  . diltiazem (TIADYLT ER) 180 MG 24 hr capsule Take 1 capsule (180 mg total) by mouth daily.  . famotidine (PEPCID) 20 MG tablet Take 1 tablet (20 mg total) by mouth 2 (two) times daily.  Marland Kitchen GARLIC OIL PO Take 1 tablet by mouth daily.  Marland Kitchen LORazepam (ATIVAN) 1 MG tablet Take 0.5 tablets (0.5 mg total) by mouth every 8 (eight) hours as needed for anxiety.  . meclizine (ANTIVERT) 12.5 MG tablet Take 1-2 tablets (12.5-25 mg total) by mouth 3 (three) times daily as needed for dizziness.  Marland Kitchen omeprazole (PRILOSEC) 20 MG capsule TAKE 1 CAPSULE BY MOUTH EVERY DAY  . VENTOLIN HFA 108 (90 Base) MCG/ACT inhaler INHALE 2 PUFFS EVERY 6 (SIX) HOURS AS NEEDED INTO THE LUNGS FOR WHEEZING OR SHORTNESS OF BREATH.  . vitamin E 180 MG (400 UNITS) capsule Take 400 Units by mouth daily.   No facility-administered encounter medications on file as of 07/16/2020.    Wt Readings from Last 3 Encounters:  06/18/20 87 lb 12.8 oz (39.8 kg)  06/17/19 88 lb 1.9 oz (40 kg)  06/03/18 88 lb (39.9 kg)    Lab Results  Component Value Date   CREATININE 0.78 06/18/2020   BUN 12 06/18/2020   GFR 77.01 06/18/2020   GFRNONAA 92.18 05/09/2010   GFRAA 111 03/07/2008   NA 139 06/18/2020   K 4.4 06/18/2020   CALCIUM 9.9 06/18/2020  CO2 26 06/18/2020     Current Diagnosis/Assessment:    Goals Addressed   None     Hypertension   BP goal is:  <130/80  Office blood pressures are  BP Readings from Last 3 Encounters:  06/18/20 138/72  01/26/20 133/65  06/17/19 (!) 154/96   Pulse Readings from Last 3 Encounters:  06/18/20 72  01/26/20 73  06/17/19 83   Patient checks BP at home infrequently Patient home BP readings are ranging: n/a   Patient has failed these meds in the past: n/a Patient is currently controlled on the following medications:  . Diltiazem ER 180 mg daily  We discussed: BP goals; Pt reports hx palpitations - diltiazem is more for that than for BP;  Smoking  cessation may help with HR and BP  Plan  Continue current medications and control with diet and exercise     Hyperlipidemia   LDL goal 30% reduction (<110)  Last lipids Lab Results  Component Value Date   CHOL 182 06/18/2020   HDL 61.00 06/18/2020   LDLCALC 106 (H) 06/18/2020   LDLDIRECT 148.6 06/06/2013   TRIG 76.0 06/18/2020   CHOLHDL 3 06/18/2020   Hepatic Function Latest Ref Rng & Units 06/18/2020 06/17/2019 06/03/2018  Total Protein 6.0 - 8.3 g/dL 7.7 7.3 7.6  Albumin 3.5 - 5.2 g/dL 4.6 4.5 4.6  AST 0 - 37 U/L _0 ALT 0 - 35 U/L _1 Alk Phosphatase 39 - 117 U/L 73 79 79  Total Bilirubin 0.2 - 1.2 mg/dL 0.7 0.8 0.8  Bilirubin, Direct 0.0 - 0.3 mg/dL - - -    The 10-year ASCVD risk score Mikey Bussing DC Jr., et al., 2013) is: 21.8%   Values used to calculate the score:     Age: 43 years     Sex: Female     Is Non-Hispanic African American: No     Diabetic: No     Tobacco smoker: Yes     Systolic Blood Pressure: 222 mmHg     Is BP treated: Yes     HDL Cholesterol: 61 mg/dL     Total Cholesterol: 182 mg/dL   Patient has failed these meds in past: atorvastatin, pravastatin (leg pain) Patient is currently controlled on the following medications:  . Garlic supplement  We discussed:  Cholesterol goals; benefits of diet / exercise for ASCVD risk reduction  Plan  Continue control with diet and exercise  Anxiety / depression   Depression screen [facility] 2/9 06/13/2019 06/03/2018 05/18/2017  Decreased Interest 0 0 0  Down, Depressed, Hopeless 2 2 0  PHQ - 2 Score 2 2 0  Altered sleeping 1 1 -  Tired, decreased energy 1 1 -  Change in appetite 0 1 -  Feeling bad or failure about yourself  0 1 -  Trouble concentrating 0 0 -  Moving slowly or fidgety/restless 0 0 -  Suicidal thoughts 0 0 -  PHQ-9 Score 4 6 -  Difficult doing work/chores Not difficult at all Somewhat difficult -   Patient has failed these meds in past: bupropion (rash), paroxetine (rash) Patient  is currently uncontrolled on the following medications:  . Lorazepam 1 mg - 1/2 tab q8h PRN  We discussed: Pt is taking lorazepam more frequently since sister died. Never takes more than 1/2 tab per day.   -Discussed SSRI benefits again; discussed benefits of CBT and talking to her husband about her grief; pt declined SSRI today but may consider  it in future  Plan  Continue current medications  Pt is considering SSRI   GERD   Patient has failed these meds in past: zantac, omeprazole Patient is currently controlled on the following medications:  . Famotidine 20 mg BID  We discussed: pt reports taking famotidine once daily, she usually misses the PM dose; she reports symptoms are worse at night; advised she keep the bottle on her nightstand as a reminder to take it before bed  Plan  Continue current medications  Osteoporosis   Last DEXA Scan: 06/16/2018   T-Score femoral neck: -3.0  T-Score total hip: n/a  T-Score lumbar spine: -1.8  T-Score forearm radius: n/a  Last vitamin D/Calcium Lab Results  Component Value Date   VD25OH 53.30 06/18/2020   CALCIUM 9.9 06/18/2020   Patient is a candidate for pharmacologic treatment due to T-Score < -2.5 in femoral neck  Patient has failed these meds in past: n/a  Patient is currently uncontrolled on the following medications:  . Calcium-Vitamin D 600-125 mg-IU 2 tab daily   We discussed:  Recommend [id number] units of vitamin D daily. Recommend 1200 mg of calcium daily from dietary and supplemental sources. Recommend weight-bearing and muscle strengthening exercises for building and maintaining bone density.  Pt has tried to start Prolia in the past but insurance did not cover it; bisphosphonates are also an option but may exacerbate GERD -pt has repeat DEXA scan next week  Plan  F/U DEXA scan results Recommend checking Prolia coverage in 2022 If not covered, may try alendronate weekly  Tobacco Abuse   Tobacco Status:  Social  History   Tobacco Use  Smoking Status Current Every Day Smoker  . Packs/day: 0.50  Smokeless Tobacco Never Used  Tobacco Comment   smoked 1967- present, up to 1 ppd; now 1/2-3/4 ppd   On a scale of 1-10, reports MOTIVATION to quit is moderate On a scale of 1-10, reports CONFIDENCE in quitting is moderate  Previous quit attempts included: nicotine gum (quit x 7 days), patch makes her sick, lozenges are "nasty", [place] Quit-line  We discussed:  Importance of smoking cessation; pt has quit for 7 days in the past by using nicotine gum; she has used the quit Line before as well but did not like getting calls all the time especially if she slipped up; she is allergic to bupropion and is "scared" of Chantix; -pt is still having a hard time grieving her sister, she does not not think now is a good time to quit smoking  Plan  Continue to reassess readiness to quit  Headaches   Patient has failed these meds in past: Tylenol Patient is currently controlled on the following medications:  . Ibuprofen 200 mg - 4 tab as needed  We discussed:  Pt reports daily headaches since neck surgery in 2001; she has has multiple revisions and physical therapy, she reports her doctors have told her there is nothing else to be done; she uses ibuprofen sparingly because she does not like to take it; NSAIDs are relatively safe to use given adequate kidney function, controlled BP and lack of drug interactions; advised she take it with food when she does take it  Plan  Continue current medications   Medication Management   [id number] [id number] [id number]: [phone]: [phone] Uses pill box? Yes Pt endorses 100% compliance  We discussed: Current pharmacy is preferred with insurance plan and patient is satisfied with pharmacy  services  Plan  Continue current medication management strategy    Follow up: 3 month phone visit  Charlene Brooke, PharmD, BCACP Clinical Pharmacist [facility] at [facility] [phone]

## 2020-07-16 NOTE — Patient Instructions (Signed)
Visit Information  Phone number for Pharmacist: [phone]  Goals Addressed            This Visit's Progress   . Pharmacy Care Plan       CARE PLAN ENTRY (see longitudinal plan of care for additional care plan information)  Current Barriers:  . Chronic Disease Management support, education, and care coordination needs related to Hypertension, Hyperlipidemia, GERD, Depression, Anxiety, and Tobacco use   Hypertension BP Readings from Last 3 Encounters:  06/18/20 138/72  01/26/20 133/65  06/17/19 (!) 154/96 .  Pharmacist Clinical Goal(s): o Over the next 90 days, patient will work with PharmD and providers to maintain BP goal <140/90 . Current regimen:  o Diltiazem ER 180 mg daily . Interventions: o Discussed BP goals and benefits of medications for prevention of heart attack / stroke o Discussed benefits of diltiazem for palpitations . Patient self care activities - Over the next 90 days, patient will: o Check BP weekly, document, and provide at future appointments o Ensure daily salt intake < 2300 mg/day  Hyperlipidemia Lab Results  Component Value Date/Time   LDLCALC 106 (H) 06/18/2020 10:52 AM   LDLCALC 127 (H) 06/07/2014 02:28 PM   LDLDIRECT 148.6 06/06/2013 09:42 AM .  Pharmacist Clinical Goal(s): o Over the next 90 days, patient will work with PharmD and providers to maintain LDL goal < 110 . Current regimen:  o Garlic supplement . Interventions: o Discussed cholesterol goals and benefits of diet / exercise for prevention of heart attack / stroke . Patient self care activities - Over the next 90 days, patient will: o Continue low cholesterol diet and exercise routine  Smoking cessation . Pharmacist Clinical Goal(s) o Over the next 90 days, patient will work with PharmD and providers to set quit date . Current regimen:  o Nothing . Interventions: o Discussed benefits of nicotine gum and [place] Quit Line for aid with smoking cessation. Patient is allergic to  bupropion and declines Chantix. . Patient self care activities - Over the next 90 days, patient will: o Contact PCP or pharmacist for help with smoking cessation  Depression / Anxiety . Pharmacist Clinical Goal(s) o Over the next 90 days, patient will work with PharmD and providers to optimize therapy . Current regimen:  o Lorazepam 1 mg - 1/2 tablet as needed . Interventions: o Discussed benefits of antidepressants (SSRIs) for management of anxiety and depression related to grief and loss . Patient self care activities - Over the next 90 days, patient will: o Consider starting a daily medication to help with stress/grief. Contact PCP or pharmacist when ready to start  GERD . Pharmacist Clinical Goal(s) o Over the next 90 days, patient will work with PharmD and providers to optimize therapy . Current regimen:  o Famotidine 20 mg twice a day . Interventions: o Advised to keep bottle on nightstand as reminder to take PM dose . Patient self care activities - Over the next 90 days, patient will: o Continue current medications  Medication management . Pharmacist Clinical Goal(s): o Over the next 90 days, patient will work with PharmD and providers to maintain optimal medication adherence . Current pharmacy: [facility] . Interventions o Comprehensive medication review performed. o Continue current medication management strategy . Patient self care activities - Over the next 90 days, patient will: o Focus on medication adherence by fill date o Take medications as prescribed o Report any questions or concerns to PharmD and/or provider(s)  Please see past updates related to this  goal by clicking on the "Past Updates" button in the selected goal       The patient verbalized understanding of instructions, educational materials, and care plan provided today and declined offer to receive copy of patient instructions, educational materials, and care plan.  Telephone follow up appointment with  pharmacy team member scheduled for: 3 months  Charlene Brooke, PharmD, [facility] Clinical Pharmacist [facility] [phone]

## 2020-07-23 ENCOUNTER — Other Ambulatory Visit: Payer: Self-pay

## 2020-07-23 ENCOUNTER — Ambulatory Visit (INDEPENDENT_AMBULATORY_CARE_PROVIDER_SITE_OTHER)
Admission: RE | Admit: 2020-07-23 | Discharge: 2020-07-23 | Disposition: A | Discharge status: Home / Self Care | Payer: Medicare HMO | Source: Ambulatory Visit | Attending: Internal Medicine | Admitting: Internal Medicine

## 2020-07-23 ENCOUNTER — Ambulatory Visit
Admission: RE | Admit: 2020-07-23 | Discharge: 2020-07-23 | Disposition: A | Discharge status: Home / Self Care | Payer: Medicare HMO | Source: Ambulatory Visit | Attending: Internal Medicine | Admitting: Internal Medicine

## 2020-07-23 DIAGNOSIS — M810 Age-related osteoporosis without current pathological fracture: Secondary | ICD-10-CM

## 2020-07-23 DIAGNOSIS — E041 Nontoxic single thyroid nodule: Secondary | ICD-10-CM

## 2020-07-24 DIAGNOSIS — Z1152 Encounter for screening for COVID-19: Secondary | ICD-10-CM | POA: Diagnosis not present

## 2020-08-03 ENCOUNTER — Telehealth (INDEPENDENT_AMBULATORY_CARE_PROVIDER_SITE_OTHER): Payer: Medicare HMO | Admitting: Internal Medicine

## 2020-08-03 ENCOUNTER — Telehealth: Payer: Self-pay | Admitting: Internal Medicine

## 2020-08-03 ENCOUNTER — Encounter: Payer: Self-pay | Admitting: Internal Medicine

## 2020-08-03 DIAGNOSIS — U071 COVID-19: Secondary | ICD-10-CM | POA: Insufficient documentation

## 2020-08-03 NOTE — Telephone Encounter (Signed)
  Patient was diagnosed with covid 01.11.21 and has taken tylenol severe cold, tylenol sever sinus and allergy, and alkaseltzer cold and flu and she has gotten a little better but she is wondering if there is anything else she should take.

## 2020-08-03 NOTE — Assessment & Plan Note (Signed)
Acute Symptoms started 1/11 Tested 1/11-returned +1/13 Has been experiencing headaches, nasal congestion, fatigue, from her waist down aches, decreased appetite.  She is a smoker.  She denies any increased shortness of breath or wheeze.  She states she has a minimal cough, once in a while.  She has not had any fevers. She has been taken several over-the-counter cold medications without much relief She was concerned about the length of her symptoms and wondered what else she can take Has not had any vaccines Will refer for outpatient treatment given her unvaccinated status Discussed symptomatic treatment Advised her to let me know if her symptoms change or worsen

## 2020-08-03 NOTE — Telephone Encounter (Signed)
Patient scheduled.

## 2020-08-03 NOTE — Telephone Encounter (Signed)
I called and left message for patient to return call to clinic to discuss. It appears her symptoms are mild so she can continue with what she is currently taking. If symptoms become more severe she can set up virtual appointment with Dr. Quay Burow if she wants medication to help with her symptoms.

## 2020-08-03 NOTE — Progress Notes (Signed)
Virtual Visit via Video Note  I connected with Jennifer Stephenson on 08/03/20 at  1:15 PM EST by a video enabled telemedicine application and verified that I am speaking with the correct person using two identifiers.   I discussed the limitations of evaluation and management by telemedicine and the availability of in person appointments. The patient expressed understanding and agreed to proceed.  Present for the visit:  Myself, Dr Billey Gosling, Gregary Cromer.  The patient is currently at home and I am in the office.    No referring provider.    History of Present Illness: This is an acute visit for COVID.    Her symptoms started- 07/24/20  She tested positive for COVID- 1/11 -  Came back 1/13  Symptoms - headache, nasal congestion, waste down ached, fatigued, decreased appetite  Taking - tylenol sinus, tylenol severe cold and flu, coricidin cold and flu, alka seltzer cold and flu  The fatigue and not feeling well is what bothers her the most.     Review of Systems  Constitutional: Positive for malaise/fatigue. Negative for fever.       Dec appetite - making herself eat  HENT: Positive for congestion. Negative for sinus pain and sore throat.   Respiratory: Positive for cough (minimal). Negative for shortness of breath and wheezing.   Gastrointestinal: Positive for diarrhea (once).  Musculoskeletal: Positive for myalgias.  Neurological: Positive for headaches.      Social History   Socioeconomic History  . Marital status: Married    Spouse name: Not on file  . Number of children: 2  . Years of education: Not on file  . Highest education level: Not on file  Occupational History  . Occupation: DISABLED  Tobacco Use  . Smoking status: Current Every Day Smoker    Packs/day: 0.50  . Smokeless tobacco: Never Used  . Tobacco comment: smoked 1967- present, up to 1 ppd; now 1/2-3/4 ppd  Vaping Use  . Vaping Use: Never used  Substance and Sexual Activity  . Alcohol use: No   . Drug use: No  . Sexual activity: Yes  Other Topics Concern  . Not on file  Social History Narrative    CAFFEINE 3 CUPS DAILY   Social Determinants of Health   Financial Resource Strain: Low Risk   . Difficulty of Paying Living Expenses: Not hard at all  Food Insecurity: Not on file  Transportation Needs: Not on file  Physical Activity: Not on file  Stress: Not on file  Social Connections: Not on file     Observations/Objective: Appears well in NAD Breathing normally Skin appears warm and dry  Assessment and Plan:  See Problem List for Assessment and Plan of chronic medical problems.   Follow Up Instructions:    I discussed the assessment and treatment plan with the patient. The patient was provided an opportunity to ask questions and all were answered. The patient agreed with the plan and demonstrated an understanding of the instructions.   The patient was advised to call back or seek an in-person evaluation if the symptoms worsen or if the condition fails to improve as anticipated.    Binnie Rail, MD

## 2020-08-13 ENCOUNTER — Telehealth: Payer: Self-pay | Admitting: Pharmacist

## 2020-08-13 NOTE — Progress Notes (Signed)
° ° °  Chronic Care Management Pharmacy Assistant   Name: Jennifer Stephenson  MRN: [id number] DOB: 06/07/1951  Reason for Encounter: Chart Review   PCP : Binnie Rail, MD  Allergies:   Allergies  Allergen Reactions   Bupropion Hcl     rash   Paroxetine     rash   Pentazocine Lactate     ([place])hospitalized due to mental status changes   Ranitidine Hives   Lipitor [Atorvastatin]     09/05/13 leg pain as per phone call   Pravastatin     Leg pain   Prednisone     Nausea & cramps   Sulfonamide Derivatives     nausea    Medications: Outpatient Encounter Medications as of 08/13/2020  Medication Sig   Calcium-Vitamin D 600-125 MG-UNIT TABS Take by mouth daily. Take 2   diltiazem (TIADYLT ER) 180 MG 24 hr capsule Take 1 capsule (180 mg total) by mouth daily.   famotidine (PEPCID) 20 MG tablet Take 1 tablet (20 mg total) by mouth 2 (two) times daily.   GARLIC OIL PO Take 1 tablet by mouth daily.   LORazepam (ATIVAN) 1 MG tablet Take 0.5 tablets (0.5 mg total) by mouth every 8 (eight) hours as needed for anxiety.   meclizine (ANTIVERT) 12.5 MG tablet Take 1-2 tablets (12.5-25 mg total) by mouth 3 (three) times daily as needed for dizziness.   VENTOLIN HFA 108 (90 Base) MCG/ACT inhaler INHALE 2 PUFFS EVERY 6 (SIX) HOURS AS NEEDED INTO THE LUNGS FOR WHEEZING OR SHORTNESS OF BREATH.   vitamin E 180 MG (400 UNITS) capsule Take 400 Units by mouth daily.   No facility-administered encounter medications on file as of 08/13/2020.    Current Diagnosis: Patient Active Problem List   Diagnosis Date Noted   COVID 08/03/2020   Lower abdominal pain 06/17/2019   Reactive airway disease 05/18/2017   Generalized anxiety disorder 05/18/2017   Dizziness 04/28/2016   Osteoporosis 10/04/2015   Elevated hematocrit 06/10/2014   GERD (gastroesophageal reflux disease) 10/25/2013   Abnormal EKG 06/01/2012   COLONIC POLYPS, HX OF 09/19/2008   THYROID NODULE 03/28/2008    Hyperlipidemia 03/07/2008   Tobacco dependence due to cigarettes 03/07/2008   Esophageal dysphagia 03/07/2008   INTERSTITIAL CYSTITIS 07/29/2007   Essential hypertension 05/27/2007    Goals Addressed   None     Follow-Up:  Pharmacist Review    Reviewed chart for medication changes and adherence.   No gaps in adherence identified. Patient has follow up scheduled with pharmacy team. No further action required.   Wendy Poet, [place] [phone]

## 2020-09-13 ENCOUNTER — Ambulatory Visit (INDEPENDENT_AMBULATORY_CARE_PROVIDER_SITE_OTHER): Payer: Medicare HMO

## 2020-09-13 ENCOUNTER — Other Ambulatory Visit: Payer: Self-pay

## 2020-09-13 VITALS — BP 130/80 | HR 78 | Temp 97.9°F | Ht 62.0 in | Wt 89.2 lb

## 2020-09-13 DIAGNOSIS — Z0000 Encounter for general adult medical examination without abnormal findings: Secondary | ICD-10-CM

## 2020-09-13 NOTE — Progress Notes (Signed)
Subjective:   Jennifer Stephenson is a 70 y.o. female who presents for Medicare Annual (Subsequent) preventive examination.  Review of Systems    No ROS. Medicare Wellness Visit. Additional risk factors are reflected in social history. Cardiac Risk Factors include: advanced age (>68men, >33 women);dyslipidemia;family history of premature cardiovascular disease;hypertension     Objective:    Today's Vitals   09/13/20 0855  BP: 130/80  Pulse: 78  Temp: 97.9 F (36.6 C)  SpO2: 98%  Weight: 89 lb 3.2 oz (40.5 kg)  Height: 5\' 2"  (1.575 m)  PainSc: 0-No pain   Body mass index is 16.31 kg/m.  Advanced Directives 09/13/2020 06/13/2019 06/03/2018  Does Patient Have a Medical Advance Directive? Yes No No  Type of Advance Directive Living will;Healthcare Power of Attorney - -  Does patient want to make changes to medical advance directive? No - Patient declined No - Patient declined -  Copy of [place] in Chart? No - copy requested - -  Would patient like information on creating a medical advance directive? - - Yes (ED - Information included in AVS)    Current Medications (verified) Outpatient Encounter Medications as of 09/13/2020  Medication Sig   Calcium-Vitamin D 600-125 MG-UNIT TABS Take by mouth daily. Take 2   diltiazem (TIADYLT ER) 180 MG 24 hr capsule Take 1 capsule (180 mg total) by mouth daily.   GARLIC OIL PO Take 1 tablet by mouth daily.   LORazepam (ATIVAN) 1 MG tablet Take 0.5 tablets (0.5 mg total) by mouth every 8 (eight) hours as needed for anxiety.   meclizine (ANTIVERT) 12.5 MG tablet Take 1-2 tablets (12.5-25 mg total) by mouth 3 (three) times daily as needed for dizziness.   vitamin E 180 MG (400 UNITS) capsule Take 400 Units by mouth daily.   famotidine (PEPCID) 20 MG tablet Take 1 tablet (20 mg total) by mouth 2 (two) times daily. (Patient not taking: Reported on 09/13/2020)   VENTOLIN HFA 108 (90 Base) MCG/ACT inhaler INHALE 2 PUFFS  EVERY 6 (SIX) HOURS AS NEEDED INTO THE LUNGS FOR WHEEZING OR SHORTNESS OF BREATH. (Patient not taking: Reported on 09/13/2020)   No facility-administered encounter medications on file as of 09/13/2020.    Allergies (verified) Bupropion hcl, Paroxetine, Pentazocine lactate, Ranitidine, Lipitor [atorvastatin], Pravastatin, Prednisone, and Sulfonamide derivatives   History: Past Medical History:  Diagnosis Date   Adenomatous colon polyp 2007 & 2010   Allergy    seasonal   Anemia    PMH of    DDD (degenerative disc disease), cervical    Dysphagia    Endometriosis    Fibrocystic breast disease    Fracture of wrist, closed    bilaterally age 89 (  fell skating )   GERD (gastroesophageal reflux disease)    HTN (hypertension)    Hyperlipidemia    Interstitial cystitis    Osteopenia    [address] DEXA   Syncope 2007   seen in ER ; Dx: dehydration   Past Surgical History:  Procedure Laterality Date   ABDOMINAL HYSTERECTOMY  1996   & BSO FOR ENDOMETRIOSIS   CERVICAL FUSION     X3:.2001 C4-6, 2007 C6-7,2012C3-4 (?)   COLONOSCOPY  2013   negative   COLONOSCOPY W/ POLYPECTOMY  2007 & 2011   ADENOMATOUS polyp, internal hemorrhoids   EGD WITH ESOPHAGEAL DILATION  05/15/2009,07/03/11   ESOPHAGEAL MANOMETRY  10/27/2011   Procedure: ESOPHAGEAL MANOMETRY (EM);  Surgeon: Gatha Mayer, MD;  Location: [facility];  Service: Endoscopy;  Laterality: N/A;   SEPTOPLASTY     TUBAL LIGATION     Family History  Problem Relation Age of Onset   Dementia Mother    Ulcers Sister    Stroke Sister         X 2; both > 83   Esophageal cancer Sister    Ulcers Brother    Diabetes Paternal Uncle    Cancer Maternal Grandmother        CNS   Heart attack Brother         in 71s   Cancer Brother        PANCREATIC    Diabetes Brother        X 2   Stroke Brother        X2 , both > 60   Social History   Socioeconomic History   Marital status: Married    Spouse name:  Not on file   Number of children: 2   Years of education: Not on file   Highest education level: Not on file  Occupational History   Occupation: DISABLED  Tobacco Use   Smoking status: Current Every Day Smoker    Packs/day: 0.50   Smokeless tobacco: Never Used   Tobacco comment: smoked 1967- present, up to 1 ppd; now 1/2-3/4 ppd  Vaping Use   Vaping Use: Never used  Substance and Sexual Activity   Alcohol use: No   Drug use: No   Sexual activity: Yes  Other Topics Concern   Not on file  Social History Narrative    CAFFEINE 3 CUPS DAILY   Social Determinants of Health   Financial Resource Strain: Low Risk    Difficulty of Paying Living Expenses: Not hard at all  Food Insecurity: No Food Insecurity   Worried About Charity fundraiser in the Last Year: Never true   [place] in the Last Year: Never true  Transportation Needs: No Transportation Needs   Lack of Transportation (Medical): No   Lack of Transportation (Non-Medical): No  Physical Activity: Sufficiently Active   Days of Exercise per Week: 5 days   Minutes of Exercise per Session: 30 min  Stress: Stress Concern Present   Feeling of Stress : Rather much  Social Connections: Socially Integrated   Frequency of Communication with Friends and Family: More than three times a week   Frequency of Social Gatherings with Friends and Family: Once a week   Attends Religious Services: More than 4 times per year   Active Member of [employer] or Organizations: No   Attends Music therapist: More than 4 times per year   Marital Status: Married    Tobacco Counseling Ready to quit: Not Answered Counseling given: Not Answered Comment: smoked 1967- present, up to 1 ppd; now 1/2-3/4 ppd   Clinical Intake:  Pre-visit preparation completed: Yes  Pain : No/denies pain Pain Score: 0-No pain     BMI - recorded: 16.31 Nutritional Status: BMI <19  Underweight Nutritional Risks:  Unintentional weight loss Diabetes: No  How often do you need to have someone help you when you read instructions, pamphlets, or other written materials from your doctor or pharmacy?: 1 - Never What is the last grade level you completed in school?: GED  Diabetic? no  Interpreter Needed?: No  Information entered by :: Lisette Abu, LPN   Activities of Daily Living In your present state of health, do you have any difficulty performing the  following activities: 09/13/2020  Hearing? N  Vision? N  Difficulty concentrating or making decisions? N  Walking or climbing stairs? N  Dressing or bathing? N  Doing errands, shopping? N  Preparing Food and eating ? N  Using the Toilet? N  In the past six months, have you accidently leaked urine? Y  Do you have problems with loss of bowel control? N  Managing your Medications? N  Managing your Finances? N  Housekeeping or managing your Housekeeping? N  Some recent data might be hidden    Patient Care Team: Binnie Rail, MD as PCP - General (Internal Medicine) Charlton Haws, [facility] as Pharmacist (Pharmacist)  Indicate any recent Medical Services you may have received from other than [facility] providers in the past year (date may be approximate).     Assessment:   This is a routine wellness examination for Jennifer Stephenson.  Hearing/Vision screen No exam data present  Dietary issues and exercise activities discussed: Current Exercise Habits: Home exercise routine, Type of exercise: walking, Time (Minutes): 30, Frequency (Times/Week): 5, Weekly Exercise (Minutes/Week): 150, Intensity: Moderate, Exercise limited by: respiratory conditions(s);orthopedic condition(s)  Goals     Patient Stated     I would like to go camping in the mountains just with me and my husband.     Patient Stated     I would like to quit smoking by perhaps cutting back gradually until I can eventually quit.     Pharmacy Care Plan     CARE PLAN ENTRY (see  longitudinal plan of care for additional care plan information)  Current Barriers:   Chronic Disease Management support, education, and care coordination needs related to Hypertension, Hyperlipidemia, GERD, Depression, Anxiety, and Tobacco use   Hypertension BP Readings from Last 3 Encounters:  06/18/20 138/72  01/26/20 133/65  06/17/19 (!) 154/96   Pharmacist Clinical Goal(s): o Over the next 90 days, patient will work with PharmD and providers to maintain BP goal <140/90  Current regimen:  o Diltiazem ER 180 mg daily  Interventions: o Discussed BP goals and benefits of medications for prevention of heart attack / stroke o Discussed benefits of diltiazem for palpitations  Patient self care activities - Over the next 90 days, patient will: o Check BP weekly, document, and provide at future appointments o Ensure daily salt intake < 2300 mg/day  Hyperlipidemia Lab Results  Component Value Date/Time   LDLCALC 106 (H) 06/18/2020 10:52 AM   LDLCALC 127 (H) 06/07/2014 02:28 PM   LDLDIRECT 148.6 06/06/2013 09:42 AM   Pharmacist Clinical Goal(s): o Over the next 90 days, patient will work with PharmD and providers to maintain LDL goal < 110  Current regimen:  o Garlic supplement  Interventions: o Discussed cholesterol goals and benefits of diet / exercise for prevention of heart attack / stroke  Patient self care activities - Over the next 90 days, patient will: o Continue low cholesterol diet and exercise routine  Smoking cessation  Pharmacist Clinical Goal(s) o Over the next 90 days, patient will work with PharmD and providers to set quit date  Current regimen:  o Nothing  Interventions: o Discussed benefits of nicotine gum and [place] Quit Line for aid with smoking cessation. Patient is allergic to bupropion and declines Chantix.  Patient self care activities - Over the next 90 days, patient will: o Contact PCP or pharmacist for help with smoking cessation  Depression  / Anxiety  Pharmacist Clinical Goal(s) o Over the next 90 days, patient will  work with PharmD and providers to optimize therapy  Current regimen:  o Lorazepam 1 mg - 1/2 tablet as needed  Interventions: o Discussed benefits of antidepressants (SSRIs) for management of anxiety and depression related to grief and loss  Patient self care activities - Over the next 90 days, patient will: o Consider starting a daily medication to help with stress/grief. Contact PCP or pharmacist when ready to start  GERD  Pharmacist Clinical Goal(s) o Over the next 90 days, patient will work with PharmD and providers to optimize therapy  Current regimen:  o Famotidine 20 mg twice a day  Interventions: o Advised to keep bottle on nightstand as reminder to take PM dose  Patient self care activities - Over the next 90 days, patient will: o Continue current medications  Medication management  Pharmacist Clinical Goal(s): o Over the next 90 days, patient will work with PharmD and providers to maintain optimal medication adherence  Current pharmacy: [facility]  Interventions o Comprehensive medication review performed. o Continue current medication management strategy  Patient self care activities - Over the next 90 days, patient will: o Focus on medication adherence by fill date o Take medications as prescribed o Report any questions or concerns to PharmD and/or provider(s)  Please see past updates related to this goal by clicking on the "Past Updates" button in the selected goal      Quit Smoking      Depression Screen PHQ 2/9 Scores 09/13/2020 06/13/2019 06/03/2018 05/18/2017 04/28/2016 06/06/2013 06/01/2012  PHQ - 2 Score 2 2 2  0 0 2 2  PHQ- 9 Score - 4 6 - - 5 -    Fall Risk Fall Risk  09/13/2020 06/18/2020 06/17/2019 06/13/2019 06/03/2018  Falls in the past year? 0 0 1 0 0  Number falls in past yr: 0 0 0 0 -  Injury with Fall? 0 0 - 0 -  Risk for fall due to : No Fall Risks History of  fall(s);No Fall Risks - - -  Follow up Falls evaluation completed Falls evaluation completed - - -    FALL RISK PREVENTION PERTAINING TO THE HOME:  Any stairs in or around the home? No  If so, are there any without handrails? No  Home free of loose throw rugs in walkways, pet beds, electrical cords, etc? Yes  Adequate lighting in your home to reduce risk of falls? Yes   ASSISTIVE DEVICES UTILIZED TO PREVENT FALLS:  Life alert? No  Use of a cane, walker or w/c? No  Grab bars in the bathroom? No  Shower chair or bench in shower? No  Elevated toilet seat or a handicapped toilet? Yes   TIMED UP AND GO:  Was the test performed? No .  Length of time to ambulate 10 feet: 0 sec.   Gait steady and fast without use of assistive device  Cognitive Function: Normal cognitive status assessed by direct observation by this Nurse Health Advisor. No abnormalities found.          Immunizations Immunization History  Administered Date(s) Administered   Influenza Split 05/22/2011   Influenza Whole 05/09/2010, 04/18/2012, 04/13/2013   Influenza, High Dose Seasonal PF 04/28/2016, 04/20/2017, 04/13/2019, 05/08/2020   Influenza,inj,Quad PF,6+ Mos 05/11/2015   Influenza-Unspecified 04/17/2014, 04/13/2016, 04/20/2017   Pneumococcal Conjugate-13 10/04/2015   Pneumococcal Polysaccharide-23 10/20/2016   Td 03/07/2008   Tdap 03/29/2019    TDAP status: Up to date  Flu Vaccine status: Up to date  Pneumococcal vaccine status: Up to date  Covid-19 vaccine status: Declined, Education has been provided regarding the importance of this vaccine but patient still declined. Advised may receive this vaccine at local pharmacy or Health Dept.or vaccine clinic. Aware to provide a copy of the vaccination record if obtained from local pharmacy or Health Dept. Verbalized acceptance and understanding.  Qualifies for Shingles Vaccine? Yes   Zostavax completed No   Shingrix Completed?: No.    Education  has been provided regarding the importance of this vaccine. Patient has been advised to call insurance company to determine out of pocket expense if they have not yet received this vaccine. Advised may also receive vaccine at local pharmacy or Health Dept. Verbalized acceptance and understanding.  Screening Tests Health Maintenance  Topic Date Due   COVID-19 Vaccine (1) Never done   MAMMOGRAM  09/11/2021   COLONOSCOPY (Pts 45-6yrs Insurance coverage will need to be confirmed)  10/30/2021   DEXA SCAN  07/23/2022   TETANUS/TDAP  03/28/2029   INFLUENZA VACCINE  Completed   Hepatitis C Screening  Completed   PNA vac Low Risk Adult  Completed   HPV VACCINES  Aged Out    Health Maintenance  Health Maintenance Due  Topic Date Due   COVID-19 Vaccine (1) Never done    Colorectal cancer screening: Type of screening: Colonoscopy. Completed 10/31/2011. Repeat every 10 years  Mammogram status: Completed 09/12/2019. Repeat every year  Bone Density status: Completed 07/23/2020. Results reflect: Bone density results: OSTEOPOROSIS. Repeat every 2 years.  Lung Cancer Screening: (Low Dose CT Chest recommended if Age 44-80 years, 30 pack-year currently smoking OR have quit w/in 15years.) does qualify.   Lung Cancer Screening Referral: no  Additional Screening:  Hepatitis C Screening: does qualify; Completed yes  Vision Screening: Recommended annual ophthalmology exams for early detection of glaucoma and other disorders of the eye. Is the patient up to date with their annual eye exam?  Yes  Who is the provider or what is the name of the office in which the patient attends annual eye exams? Laurence Aly, OD at [facility] If pt is not established with a provider, would they like to be referred to a provider to establish care? No .   Dental Screening: Recommended annual dental exams for proper oral hygiene  Community Resource Referral / Chronic Care Management: CRR required this visit?   No   CCM required this visit?  No      Plan:     I have personally reviewed and noted the following in the patients chart:    Medical and social history  Use of alcohol, tobacco or illicit drugs   Current medications and supplements  Functional ability and status  Nutritional status  Physical activity  Advanced directives  List of other physicians  Hospitalizations, surgeries, and ER visits in previous 12 months  Vitals  Screenings to include cognitive, depression, and falls  Referrals and appointments  In addition, I have reviewed and discussed with patient certain preventive protocols, quality metrics, and best practice recommendations. A written personalized care plan for preventive services as well as general preventive health recommendations were provided to patient.     Sheral Flow, LPN   01/20/4800   Nurse Notes:  Medications reviewed with patient; no opioid use noted.

## 2020-09-13 NOTE — Patient Instructions (Signed)
Jennifer Stephenson , Thank you for taking time to come for your Medicare Wellness Visit. I appreciate your ongoing commitment to your health goals. Please review the following plan we discussed and let me know if I can assist you in the future.   Screening recommendations/referrals: Colonoscopy: 10/31/2011; due every 10 years  Mammogram: 09/12/2019; due every 1-2 years Bone Density: 07/23/2020; due every 2 years Recommended yearly ophthalmology/optometry visit for glaucoma screening and checkup Recommended yearly dental visit for hygiene and checkup  Vaccinations: Influenza vaccine: 05/08/2020 Pneumococcal vaccine: 10/04/2015, 10/20/2016 Tdap vaccine: 03/29/2019 Shingles vaccine: declined   Covid-19: declined  Advanced directives: Please bring a copy of your health care power of attorney and living will to the office at your convenience.  Conditions/risks identified: Yes; Reviewed health maintenance screenings with patient today and relevant education, vaccines, and/or referrals were provided. Please continue to do your personal lifestyle choices by: daily care of teeth and gums, regular physical activity (goal should be 5 days a week for 30 minutes), eat a healthy diet, avoid tobacco and drug use, limiting any alcohol intake, taking a low-dose aspirin (if not allergic or have been advised by your provider otherwise) and taking vitamins and minerals as recommended by your provider. Continue doing brain stimulating activities (puzzles, reading, adult coloring books, staying active) to keep memory sharp. Continue to eat heart healthy diet (full of fruits, vegetables, whole grains, lean protein, water--limit salt, fat, and sugar intake) and increase physical activity as tolerated.  Next appointment: Please schedule your next Medicare Wellness Visit with your Nurse Health Advisor in 1 year by calling [phone].   Preventive Care 71 Years and Older, Female Preventive care refers to lifestyle choices and  visits with your health care provider that can promote health and wellness. What does preventive care include?  A yearly physical exam. This is also called an annual well check.  Dental exams once or twice a year.  Routine eye exams. Ask your health care provider how often you should have your eyes checked.  Personal lifestyle choices, including:  Daily care of your teeth and gums.  Regular physical activity.  Eating a healthy diet.  Avoiding tobacco and drug use.  Limiting alcohol use.  Practicing safe sex.  Taking low-dose aspirin every day.  Taking vitamin and mineral supplements as recommended by your health care provider. What happens during an annual well check? The services and screenings done by your health care provider during your annual well check will depend on your age, overall health, lifestyle risk factors, and family history of disease. Counseling  Your health care provider may ask you questions about your:  Alcohol use.  Tobacco use.  Drug use.  Emotional well-being.  Home and relationship well-being.  Sexual activity.  Eating habits.  History of falls.  Memory and ability to understand (cognition).  Work and work Statistician.  Reproductive health. Screening  You may have the following tests or measurements:  Height, weight, and BMI.  Blood pressure.  Lipid and cholesterol levels. These may be checked every 5 years, or more frequently if you are over 63 years old.  Skin check.  Lung cancer screening. You may have this screening every year starting at age 58 if you have a 30-pack-year history of smoking and currently smoke or have quit within the past 15 years.  Fecal occult blood test (FOBT) of the stool. You may have this test every year starting at age 61.  Flexible sigmoidoscopy or colonoscopy. You may have a sigmoidoscopy every  5 years or a colonoscopy every 10 years starting at age 9.  Hepatitis C blood test.  Hepatitis B  blood test.  Sexually transmitted disease (STD) testing.  Diabetes screening. This is done by checking your blood sugar (glucose) after you have not eaten for a while (fasting). You may have this done every 1-3 years.  Bone density scan. This is done to screen for osteoporosis. You may have this done starting at age 17.  Mammogram. This may be done every 1-2 years. Talk to your health care provider about how often you should have regular mammograms. Talk with your health care provider about your test results, treatment options, and if necessary, the need for more tests. Vaccines  Your health care provider may recommend certain vaccines, such as:  Influenza vaccine. This is recommended every year.  Tetanus, diphtheria, and acellular pertussis (Tdap, Td) vaccine. You may need a Td booster every 10 years.  Zoster vaccine. You may need this after age 11.  Pneumococcal 13-valent conjugate (PCV13) vaccine. One dose is recommended after age 88.  Pneumococcal polysaccharide (PPSV23) vaccine. One dose is recommended after age 12. Talk to your health care provider about which screenings and vaccines you need and how often you need them. This information is not intended to replace advice given to you by your health care provider. Make sure you discuss any questions you have with your health care provider. Document Released: 07/27/2015 Document Revised: 03/19/2016 Document Reviewed: 05/01/2015 Elsevier Interactive Patient Education  2017 [place] Prevention in the Home Falls can cause injuries. They can happen to people of all ages. There are many things you can do to make your home safe and to help prevent falls. What can I do on the outside of my home?  Regularly fix the edges of walkways and driveways and fix any cracks.  Remove anything that might make you trip as you walk through a door, such as a raised step or threshold.  Trim any bushes or trees on the path to your  home.  Use bright outdoor lighting.  Clear any walking paths of anything that might make someone trip, such as rocks or tools.  Regularly check to see if handrails are loose or broken. Make sure that both sides of any steps have handrails.  Any raised decks and porches should have guardrails on the edges.  Have any leaves, snow, or ice cleared regularly.  Use sand or salt on walking paths during winter.  Clean up any spills in your garage right away. This includes oil or grease spills. What can I do in the bathroom?  Use night lights.  Install grab bars by the toilet and in the tub and shower. Do not use towel bars as grab bars.  Use non-skid mats or decals in the tub or shower.  If you need to sit down in the shower, use a plastic, non-slip stool.  Keep the floor dry. Clean up any water that spills on the floor as soon as it happens.  Remove soap buildup in the tub or shower regularly.  Attach bath mats securely with double-sided non-slip rug tape.  Do not have throw rugs and other things on the floor that can make you trip. What can I do in the bedroom?  Use night lights.  Make sure that you have a light by your bed that is easy to reach.  Do not use any sheets or blankets that are too big for your bed. They should not  hang down onto the floor.  Have a firm chair that has side arms. You can use this for support while you get dressed.  Do not have throw rugs and other things on the floor that can make you trip. What can I do in the kitchen?  Clean up any spills right away.  Avoid walking on wet floors.  Keep items that you use a lot in easy-to-reach places.  If you need to reach something above you, use a strong step stool that has a grab bar.  Keep electrical cords out of the way.  Do not use floor polish or wax that makes floors slippery. If you must use wax, use non-skid floor wax.  Do not have throw rugs and other things on the floor that can make you  trip. What can I do with my stairs?  Do not leave any items on the stairs.  Make sure that there are handrails on both sides of the stairs and use them. Fix handrails that are broken or loose. Make sure that handrails are as long as the stairways.  Check any carpeting to make sure that it is firmly attached to the stairs. Fix any carpet that is loose or worn.  Avoid having throw rugs at the top or bottom of the stairs. If you do have throw rugs, attach them to the floor with carpet tape.  Make sure that you have a light switch at the top of the stairs and the bottom of the stairs. If you do not have them, ask someone to add them for you. What else can I do to help prevent falls?  Wear shoes that:  Do not have high heels.  Have rubber bottoms.  Are comfortable and fit you well.  Are closed at the toe. Do not wear sandals.  If you use a stepladder:  Make sure that it is fully opened. Do not climb a closed stepladder.  Make sure that both sides of the stepladder are locked into place.  Ask someone to hold it for you, if possible.  Clearly mark and make sure that you can see:  Any grab bars or handrails.  First and last steps.  Where the edge of each step is.  Use tools that help you move around (mobility aids) if they are needed. These include:  Canes.  Walkers.  Scooters.  Crutches.  Turn on the lights when you go into a dark area. Replace any light bulbs as soon as they burn out.  Set up your furniture so you have a clear path. Avoid moving your furniture around.  If any of your floors are uneven, fix them.  If there are any pets around you, be aware of where they are.  Review your medicines with your doctor. Some medicines can make you feel dizzy. This can increase your chance of falling. Ask your doctor what other things that you can do to help prevent falls. This information is not intended to replace advice given to you by your health care provider. Make  sure you discuss any questions you have with your health care provider. Document Released: 04/26/2009 Document Revised: 12/06/2015 Document Reviewed: 08/04/2014 Elsevier Interactive Patient Education  2017 [employer].

## 2020-10-08 ENCOUNTER — Telehealth: Payer: Medicare HMO

## 2020-10-08 NOTE — Progress Notes (Deleted)
Chronic Care Management Pharmacy Note  10/08/2020 Name:  RAJAH LAMBA MRN:  [id number] DOB:  1951-07-11  Subjective: Jennifer Stephenson is an 70 y.o. year old female who is a primary patient of Burns, Claudina Lick, MD.  The CCM team was consulted for assistance with disease management and care coordination needs.    {CCMTELEPHONEFACETOFACE:21091510} for {CCMINITIALFOLLOWUPCHOICE:21091511} in response to provider referral for pharmacy case management and/or care coordination services.   Consent to Services:  {CCMCONSENTOPTIONS:25074}  Patient Care Team: Binnie Rail, MD as PCP - General (Internal Medicine) Charlton Haws, [facility] as Pharmacist (Pharmacist)   Patient has lived here since the 82s. "I don't do much", spends most time at home. She used to be more active, but her mom got sick in 2007 and she spent most time checking on her.   Neck issues limit movement (10 lb weight limit). She has had multiple neck surgeries (last in 2012) and reports doctors/physical therapists have told her there is nothing else to be done. She report daily headaches due to this, she uses ibuprofen 800 mg sparingly for this.   She lost her sister in 05-21-23 and has been grieving. She reports seeing her sister's eyes everywhere. She reports she can talk to her husband but does not like to burden him too often.  Recent office visits: 06/18/20 Dr Quay Burow OV: chronic f/u. Sister died May 21, 2023. Labs stable, no med changes.  Recent consult visits: 01/26/20 UC visit: foot pain. Rx'd naproxen, Keflex. 09/02/19 Dr Philis Pique (OB/GYN): routine exam.  Hospital visits: None in previous 6 months  Objective:  Lab Results  Component Value Date   CREATININE 0.78 06/18/2020   BUN 12 06/18/2020   GFR 77.01 06/18/2020   GFRNONAA 92.18 05/09/2010   GFRAA 111 03/07/2008   NA 139 06/18/2020   K 4.4 06/18/2020   CALCIUM 9.9 06/18/2020   CO2 26 06/18/2020   GLUCOSE 83 06/18/2020    Lab Results  Component Value  Date/Time   GFR 77.01 06/18/2020 10:52 AM   GFR 76.55 06/17/2019 08:21 AM    Last diabetic Eye exam: No results found for: HMDIABEYEEXA  Last diabetic Foot exam: No results found for: HMDIABFOOTEX   Lab Results  Component Value Date   CHOL 182 06/18/2020   HDL 61.00 06/18/2020   LDLCALC 106 (H) 06/18/2020   LDLDIRECT 148.6 06/06/2013   TRIG 76.0 06/18/2020   CHOLHDL 3 06/18/2020    Hepatic Function Latest Ref Rng & Units 06/18/2020 06/17/2019 06/03/2018  Total Protein 6.0 - 8.3 g/dL 7.7 7.3 7.6  Albumin 3.5 - 5.2 g/dL 4.6 4.5 4.6  AST 0 - 37 U/L _0 ALT 0 - 35 U/L _1 Alk Phosphatase 39 - 117 U/L 73 79 79  Total Bilirubin 0.2 - 1.2 mg/dL 0.7 0.8 0.8  Bilirubin, Direct 0.0 - 0.3 mg/dL - - -    Lab Results  Component Value Date/Time   TSH 1.43 06/18/2020 10:52 AM   TSH 1.33 06/17/2019 08:21 AM   FREET4 0.8 09/09/2006 02:12 PM    CBC Latest Ref Rng & Units 06/18/2020 06/17/2019 06/03/2018  WBC 4.0 - 10.5 K/uL 7.0 6.9 6.3  Hemoglobin 12.0 - 15.0 g/dL 15.9(H) 15.3(H) 16.3(H)  Hematocrit 36.0 - 46.0 % 46.0 45.4 46.9(H)  Platelets 150.0 - 400.0 K/uL 217.0 210.0 208.0    Lab Results  Component Value Date/Time   VD25OH 53.30 06/18/2020 10:52 AM   VD25OH 44.86 06/17/2019 08:21 AM    Clinical ASCVD:  No  The 10-year ASCVD risk score Mikey Bussing DC Jr., et al., 2013) is: 19.6%   Values used to calculate the score:     Age: 48 years     Sex: Female     Is Non-Hispanic African American: No     Diabetic: No     Tobacco smoker: Yes     Systolic Blood Pressure: 161 mmHg     Is BP treated: Yes     HDL Cholesterol: 61 mg/dL     Total Cholesterol: 182 mg/dL    Depression screen [facility] 2/9 09/13/2020 06/13/2019 06/03/2018  Decreased Interest 1 0 0  Down, Depressed, Hopeless _0 PHQ - 2 Score _1 Altered sleeping - 1 1  Tired, decreased energy - 1 1  Change in appetite - 0 1  Feeling bad or failure about yourself  - 0 1  Trouble concentrating - 0 0  Moving slowly or  fidgety/restless - 0 0  Suicidal thoughts - 0 0  PHQ-9 Score - 4 6  Difficult doing work/chores - Not difficult at all Somewhat difficult     No flowsheet data found.  Social History   Tobacco Use  Smoking Status Current Every Day Smoker  . Packs/day: 0.50  Smokeless Tobacco Never Used  Tobacco Comment   smoked 1967- present, up to 1 ppd; now 1/2-3/4 ppd   BP Readings from Last 3 Encounters:  09/13/20 130/80  06/18/20 138/72  01/26/20 133/65   Pulse Readings from Last 3 Encounters:  09/13/20 78  06/18/20 72  01/26/20 73   Wt Readings from Last 3 Encounters:  09/13/20 89 lb 3.2 oz (40.5 kg)  06/18/20 87 lb 12.8 oz (39.8 kg)  06/17/19 88 lb 1.9 oz (40 kg)   BMI Readings from Last 3 Encounters:  09/13/20 16.31 kg/m  06/18/20 16.06 kg/m  06/17/19 16.12 kg/m    Assessment/Interventions: Review of patient past medical history, allergies, medications, health status, including review of consultants reports, laboratory and other test data, was performed as part of comprehensive evaluation and provision of chronic care management services.   SDOH:  (Social Determinants of Health) assessments and interventions performed: Yes  SDOH Screenings   Alcohol Screen: Low Risk   . Last Alcohol Screening Score (AUDIT): 0  Depression (PHQ2-9): Low Risk   . PHQ-2 Score: 2  Financial Resource Strain: Low Risk   . Difficulty of Paying Living Expenses: Not hard at all  Food Insecurity: No Food Insecurity  . Worried About Charity fundraiser in the Last Year: Never true  . Ran Out of Food in the Last Year: Never true  Housing: Low Risk   . Last Housing Risk Score: 0  Physical Activity: Sufficiently Active  . Days of Exercise per Week: 5 days  . Minutes of Exercise per Session: 30 min  Social Connections: Socially Integrated  . Frequency of Communication with Friends and Family: More than three times a week  . Frequency of Social Gatherings with Friends and Family: Once a week  .  Attends Religious Services: More than 4 times per year  . Active Member of Clubs or Organizations: No  . Attends Archivist Meetings: More than 4 times per year  . Marital Status: Married  Stress: Stress Concern Present  . Feeling of Stress : Rather much  Tobacco Use: High Risk  . Smoking Tobacco Use: Current Every Day Smoker  . Smokeless Tobacco Use: Never Used  Transportation Needs: No Transportation Needs  .  Lack of Transportation (Medical): No  . Lack of Transportation (Non-Medical): No    CCM Care Plan  Allergies  Allergen Reactions  . Bupropion Hcl     rash  . Paroxetine     rash  . Pentazocine Lactate     ([place])hospitalized due to mental status changes  . Ranitidine Hives  . Lipitor [Atorvastatin]     09/05/13 leg pain as per phone call  . Pravastatin     Leg pain  . Prednisone     Nausea & cramps  . Sulfonamide Derivatives     nausea    Medications Reviewed Today    Reviewed by Sheral Flow, LPN (Licensed Practical Nurse) on 09/13/20 at [id number]  Med List Status: <None>  Medication Order Taking? Sig Documenting Provider Last Dose Status Informant  Calcium-Vitamin D 600-125 MG-UNIT TABS [id number]  Take by mouth daily. Take 2 [provider]  Active   diltiazem (TIADYLT ER) 180 MG 24 hr capsule [id number]  Take 1 capsule (180 mg total) by mouth daily. Binnie Rail, MD  Active   famotidine (PEPCID) 20 MG tablet [id number]  Take 1 tablet (20 mg total) by mouth 2 (two) times daily. Binnie Rail, MD  Active   GARLIC OIL PO [id number]  Take 1 tablet by mouth daily. [provider]  Active Self  LORazepam (ATIVAN) 1 MG tablet [id number]  Take 0.5 tablets (0.5 mg total) by mouth every 8 (eight) hours as needed for anxiety. Binnie Rail, MD  Active   meclizine (ANTIVERT) 12.5 MG tablet [id number]  Take 1-2 tablets (12.5-25 mg total) by mouth 3 (three) times daily as needed for dizziness. Binnie Rail, MD  Active   VENTOLIN HFA 108 (90 Base)  MCG/ACT inhaler [id number]  INHALE 2 PUFFS EVERY 6 (SIX) HOURS AS NEEDED INTO THE LUNGS FOR WHEEZING OR SHORTNESS OF BREATH. Binnie Rail, MD  Active   vitamin E 180 MG (400 UNITS) capsule [id number]  Take 400 Units by mouth daily. [provider]  Active           Patient Active Problem List   Diagnosis Date Noted  . COVID 08/03/2020  . Lower abdominal pain 06/17/2019  . Reactive airway disease 05/18/2017  . Generalized anxiety disorder 05/18/2017  . Dizziness 04/28/2016  . Osteoporosis 10/04/2015  . Elevated hematocrit 06/10/2014  . GERD (gastroesophageal reflux disease) 10/25/2013  . Abnormal EKG 06/01/2012  . COLONIC POLYPS, HX OF 09/19/2008  . THYROID NODULE 03/28/2008  . Hyperlipidemia 03/07/2008  . Tobacco dependence due to cigarettes 03/07/2008  . Esophageal dysphagia 03/07/2008  . INTERSTITIAL CYSTITIS 07/29/2007  . Essential hypertension 05/27/2007    Immunization History  Administered Date(s) Administered  . Influenza Split 05/22/2011  . Influenza Whole 05/09/2010, 04/18/2012, 04/13/2013  . Influenza, High Dose Seasonal PF 04/28/2016, 04/20/2017, 04/13/2019, 05/08/2020  . Influenza,inj,Quad PF,6+ Mos 05/11/2015  . Influenza-Unspecified 04/17/2014, 04/13/2016, 04/20/2017  . Pneumococcal Conjugate-13 10/04/2015  . Pneumococcal Polysaccharide-23 10/20/2016  . Td 03/07/2008  . Tdap 03/29/2019    Conditions to be addressed/monitored:  Hypertension, Hyperlipidemia, GERD, Depression, Anxiety, Osteoporosis and Pain/Headaches  There are no care plans that you recently modified to display for this patient.    Medication Assistance: None required.  Patient affirms current coverage meets needs.  Patient's preferred pharmacy is:  [id number] [place], [place] [id number]: [phone]: [phone] Uses pill box? Yes  Pt endorses 100%  compliance  We discussed: {Pharmacy options:24294} Patient decided to: {US Pharmacy Plan:23885}  Care Plan and Follow Up Patient Decision:  {FOLLOWUP:24991}  Plan: {CM FOLLOW UP PLAN:25073}  ***   Current Barriers:  . {pharmacybarriers:24917} . ***  Pharmacist Clinical Goal(s):  Marland Kitchen Patient will {PHARMACYGOALCHOICES:24921} through collaboration with PharmD and provider.  . ***  Interventions: . 1:1 collaboration with Binnie Rail, MD regarding development and update of comprehensive plan of care as evidenced by provider attestation and co-signature . Inter-disciplinary care team collaboration (see longitudinal plan of care) . Comprehensive medication review performed; medication list updated in electronic medical record   Hypertension   BP goal is:  <130/80 Patient checks BP at home infrequently Patient home BP readings are ranging: n/a   Patient has failed these meds in the past: n/a Patient is currently controlled on the following medications:   Diltiazem ER 180 mg daily  We discussed: BP goals; Pt reports hx palpitations - diltiazem is more for that than for BP;  Smoking cessation may help with HR and BP  Plan: Continue current medications and control with diet and exercise   Hyperlipidemia   LDL goal 30% reduction (<110) Patient has failed these meds in past: atorvastatin, pravastatin (leg pain) Patient is currently controlled on the following medications:   Garlic supplement  We discussed:  Cholesterol goals; benefits of diet / exercise for ASCVD risk reduction  Plan: Continue control with diet and exercise  Anxiety / depression   Patient has failed these meds in past: bupropion (rash), paroxetine (rash) Patient is currently uncontrolled on the following medications:   Lorazepam 1 mg - 1/2 tab q8h PRN  We discussed: Pt is taking lorazepam more frequently since sister died. Never takes more than 1/2 tab per day.   -Discussed SSRI benefits  again; discussed benefits of CBT and talking to her husband about her grief; pt declined SSRI today but may consider it in future  Plan Continue current medications  Pt is considering SSRI   GERD   Patient has failed these meds in past: zantac, omeprazole Patient is currently controlled on the following medications:   Famotidine 20 mg BID  We discussed: pt reports taking famotidine once daily, she usually misses the PM dose; she reports symptoms are worse at night; advised she keep the bottle on her nightstand as a reminder to take it before bed  Plan: Continue current medications  Osteoporosis   Last DEXA Scan: 07/23/20 > Treatment recommended by PCP.             T-Score femoral neck: -3.0             T-Score lumbar spine: -1.9  Patient is a candidate for pharmacologic treatment due to T-Score < -2.5 in femoral neck  Patient has failed these meds in past: n/a  Patient is currently uncontrolled on the following medications:   Calcium-Vitamin D 600-125 mg-IU 2 tab daily   We discussed:  Pt has tried to start Prolia in the past but insurance did not cover it; bisphosphonates are also an option but may exacerbate GERD  Plan  F/U DEXA scan results Recommend checking Prolia coverage in 2022 If not covered, may try alendronate weekly  Tobacco Use   On a scale of 1-10, reports MOTIVATION to quit is moderate On a scale of 1-10, reports CONFIDENCE in quitting is moderate  Previous quit attempts included: nicotine gum (quit x 7 days), patch makes her sick, lozenges are "nasty", [place]  Quit-line  We discussed:  Importance of smoking cessation; pt has quit for 7 days in the past by using nicotine gum; she has used the [place] before as well but did not like getting calls all the time especially if she slipped up; she is allergic to bupropion and is "scared" of Chantix; -pt is still having a hard time grieving her sister, she does not not think now is a good time to quit  smoking  Plan: Continue to reassess readiness to quit  Headaches   Patient has failed these meds in past: Tylenol Patient is currently controlled on the following medications:   Ibuprofen 200 mg - 4 tab as needed  We discussed:  Pt reports daily headaches since neck surgery in 2001; she has has multiple revisions and physical therapy, she reports her doctors have told her there is nothing else to be done; she uses ibuprofen sparingly because she does not like to take it; NSAIDs are relatively safe to use given adequate kidney function, controlled BP and lack of drug interactions; advised she take it with food when she does take it  Plan: Continue current medications  Patient Goals/Self-Care Activities . Patient will:  - {pharmacypatientgoals:24919}  Follow Up Plan: {CM FOLLOW UP BHAL:93790}

## 2020-11-23 ENCOUNTER — Telehealth: Payer: Self-pay | Admitting: Pharmacist

## 2020-11-23 NOTE — Chronic Care Management (AMB) (Signed)
Chronic Care Management Pharmacy Assistant   Name: Jennifer Stephenson  MRN: [id number] DOB: [birth year]   Reason for Encounter: Disease State Hypertension Call   Conditions to be addressed/monitored: HTN   Recent office visits:  09/13/20 Gerre Pebbles NP, wellness exam  Recent consult visits:  None ID  Hospital visits:  None in previous 6 months  Medications: Outpatient Encounter Medications as of 11/23/2020  Medication Sig  . Calcium-Vitamin D 600-125 MG-UNIT TABS Take by mouth daily. Take 2  . diltiazem (TIADYLT ER) 180 MG 24 hr capsule Take 1 capsule (180 mg total) by mouth daily.  . famotidine (PEPCID) 20 MG tablet Take 1 tablet (20 mg total) by mouth 2 (two) times daily. (Patient not taking: Reported on 09/13/2020)  . GARLIC OIL PO Take 1 tablet by mouth daily.  Marland Kitchen LORazepam (ATIVAN) 1 MG tablet Take 0.5 tablets (0.5 mg total) by mouth every 8 (eight) hours as needed for anxiety.  . meclizine (ANTIVERT) 12.5 MG tablet Take 1-2 tablets (12.5-25 mg total) by mouth 3 (three) times daily as needed for dizziness.  . VENTOLIN HFA 108 (90 Base) MCG/ACT inhaler INHALE 2 PUFFS EVERY 6 (SIX) HOURS AS NEEDED INTO THE LUNGS FOR WHEEZING OR SHORTNESS OF BREATH. (Patient not taking: Reported on 09/13/2020)  . vitamin E 180 MG (400 UNITS) capsule Take 400 Units by mouth daily.   No facility-administered encounter medications on file as of 11/23/2020.    Reviewed chart prior to disease state call. Spoke with patient regarding BP  Recent Office Vitals: BP Readings from Last 3 Encounters:  09/13/20 130/80  06/18/20 138/72  01/26/20 133/65   Pulse Readings from Last 3 Encounters:  09/13/20 78  06/18/20 72  01/26/20 73    Wt Readings from Last 3 Encounters:  09/13/20 89 lb 3.2 oz (40.5 kg)  06/18/20 87 lb 12.8 oz (39.8 kg)  06/17/19 88 lb 1.9 oz (40 kg)     Kidney Function Lab Results  Component Value Date/Time   CREATININE 0.78 06/18/2020 10:52 AM   CREATININE 0.75 06/17/2019  08:21 AM   GFR 77.01 06/18/2020 10:52 AM   GFRNONAA 92.18 05/09/2010 10:36 AM   GFRAA 111 03/07/2008 12:00 AM    BMP Latest Ref Rng & Units 06/18/2020 06/17/2019 06/03/2018  Glucose 70 - 99 mg/dL 83 91 85  BUN 6 - 23 mg/dL 12 13 14   Creatinine 0.40 - 1.20 mg/dL 0.78 0.75 0.86  Sodium 135 - 145 mEq/L 139 138 139  Potassium 3.5 - 5.1 mEq/L 4.4 4.3 4.4  Chloride 96 - 112 mEq/L 105 104 105  CO2 19 - 32 mEq/L 26 25 24   Calcium 8.4 - 10.5 mg/dL 9.9 9.8 9.7    . Current antihypertensive regimen:  Diltiazem 180 mg daily  . How often are you checking your Blood Pressure? Patient states that she does not check blood pressure at home does not have cuff  . Current home BP readings: Last blood pressure reading was 130/80 at wellness exam 09/13/20  . What recent interventions/DTPs have been made by any provider to improve Blood Pressure control since last CPP Visit: None ID  . Any recent hospitalizations or ED visits since last visit with CPP? No   . What diet changes have been made to improve Blood Pressure Control?  Patient states that she watches what she eats, mostly chicken and vegetables, sometime fruit  . What exercise is being done to improve your Blood Pressure Control?  Patient states that she walks 5  days a week and gets around 10,000 steps per day  Patient did state that she is taking Famotidine but is concerned that it is in the same family as zantac. She states that it was reported that zantac causes cancer and she does not want to take it. She states that her sister died of esophageal cancer because taking zantac everyday.She states that she was told if she didn't take it she would get cancer. Patient wants to know if she could do something else that will help with with reflux without causing cancer      Adherence Review: Is the patient currently on ACE/ARB medication? No Does the patient have >5 day gap between last estimated fill dates? No  Star Rating Drugs: Diltiazem  09/13/20 90 ds  [place] Pharmacist Assistant [phone]  Time spent:33

## 2020-11-26 ENCOUNTER — Encounter: Payer: Self-pay | Admitting: Internal Medicine

## 2020-11-26 ENCOUNTER — Other Ambulatory Visit: Payer: Self-pay

## 2020-11-26 ENCOUNTER — Ambulatory Visit (INDEPENDENT_AMBULATORY_CARE_PROVIDER_SITE_OTHER): Payer: Medicare HMO | Admitting: Internal Medicine

## 2020-11-26 DIAGNOSIS — H9201 Otalgia, right ear: Secondary | ICD-10-CM | POA: Diagnosis not present

## 2020-11-26 DIAGNOSIS — R519 Headache, unspecified: Secondary | ICD-10-CM | POA: Diagnosis not present

## 2020-11-26 MED ORDER — AMOXICILLIN 500 MG PO CAPS
500.0000 mg | ORAL_CAPSULE | Freq: Three times a day (TID) | ORAL | 0 refills | Status: AC
Start: 2020-11-26 — End: 2020-12-03

## 2020-11-26 MED ORDER — NEOMYCIN-POLYMYXIN-HC 3.5-10000-1 OT SOLN
4.0000 [drp] | Freq: Four times a day (QID) | OTIC | 0 refills | Status: DC
Start: 2020-11-26 — End: 2021-06-20

## 2020-11-26 NOTE — Assessment & Plan Note (Signed)
Acute Likely neuralgia No rash  - may be early shingles - advised to monitor for rash Possibly neuralgia - occipital  -- ear pain likely related Discussed trying gabapentin = she deferred for now, but will let me know if she changes her mind

## 2020-11-26 NOTE — Assessment & Plan Note (Signed)
Acute Mild infection vs neuralgia Her ear canal is slightly erythematous and her TM is slightly dull - concern for infection Start cortisporin drops to right ear, amoxicillin 500 mg tid x 7 days Discussed her ear pain is most likely related to her head pain - no evidence of rash - ? Shingles vs occipital neuralgia

## 2020-11-26 NOTE — Patient Instructions (Signed)
Take the antibiotics as prescribed.    Please call if there is no improvement in your symptoms.  

## 2020-11-26 NOTE — Progress Notes (Signed)
Subjective:    Patient ID: Jennifer Stephenson, female    DOB: Mar 27, 1951, 70 y.o.   MRN: [id number]  HPI The patient is here for an acute visit.   Right Ear pain: it started several days ago - she thinks she got water in it. She has a sharp intermittent pain.  She tried old abx drops she had at home w/o improvement.  She denies fever, change in hearing.   She denies cold symptoms  A couple of days ago she started having sharp pain in the right posterior - lateral head.  No neck pain.  She denies rash.       Medications and allergies reviewed with patient and updated if appropriate.  Patient Active Problem List   Diagnosis Date Noted  . COVID 08/03/2020  . Lower abdominal pain 06/17/2019  . Reactive airway disease 05/18/2017  . Generalized anxiety disorder 05/18/2017  . Dizziness 04/28/2016  . Osteoporosis 10/04/2015  . Elevated hematocrit 06/10/2014  . GERD (gastroesophageal reflux disease) 10/25/2013  . Abnormal EKG 06/01/2012  . COLONIC POLYPS, HX OF 09/19/2008  . THYROID NODULE 03/28/2008  . Hyperlipidemia 03/07/2008  . Tobacco dependence due to cigarettes 03/07/2008  . Esophageal dysphagia 03/07/2008  . INTERSTITIAL CYSTITIS 07/29/2007  . Essential hypertension 05/27/2007    Current Outpatient Medications on File Prior to Visit  Medication Sig Dispense Refill  . Calcium-Vitamin D 600-125 MG-UNIT TABS Take by mouth daily. Take 2    . diltiazem (TIADYLT ER) 180 MG 24 hr capsule Take 1 capsule (180 mg total) by mouth daily. 90 capsule 3  . famotidine (PEPCID) 20 MG tablet Take 1 tablet (20 mg total) by mouth 2 (two) times daily. 60 tablet 5  . GARLIC OIL PO Take 1 tablet by mouth daily.    Marland Kitchen LORazepam (ATIVAN) 1 MG tablet Take 0.5 tablets (0.5 mg total) by mouth every 8 (eight) hours as needed for anxiety. 30 tablet 2  . meclizine (ANTIVERT) 12.5 MG tablet Take 1-2 tablets (12.5-25 mg total) by mouth 3 (three) times daily as needed for dizziness. 30 tablet 3  .  VENTOLIN HFA 108 (90 Base) MCG/ACT inhaler INHALE 2 PUFFS EVERY 6 (SIX) HOURS AS NEEDED INTO THE LUNGS FOR WHEEZING OR SHORTNESS OF BREATH. 36 g 2  . vitamin E 180 MG (400 UNITS) capsule Take 400 Units by mouth daily.     No current facility-administered medications on file prior to visit.    Past Medical History:  Diagnosis Date  . Adenomatous colon polyp 2007 & 2010  . Allergy    seasonal  . Anemia    PMH of   . DDD (degenerative disc disease), cervical   . Dysphagia   . Endometriosis   . Fibrocystic breast disease   . Fracture of wrist, closed    bilaterally age 64 (  fell skating )  . GERD (gastroesophageal reflux disease)   . HTN (hypertension)   . Hyperlipidemia   . Interstitial cystitis   . Osteopenia    [place] DEXA  . Syncope 2007   seen in ER ; Dx: dehydration    Past Surgical History:  Procedure Laterality Date  . ABDOMINAL HYSTERECTOMY  1996   & BSO FOR ENDOMETRIOSIS  . CERVICAL FUSION     X3:.2001 C4-6, 2007 C6-7,2012C3-4 (?)  . COLONOSCOPY  2013   negative  . COLONOSCOPY W/ POLYPECTOMY  2007 & 2011   ADENOMATOUS polyp, internal hemorrhoids  . EGD WITH ESOPHAGEAL DILATION  05/15/2009,07/03/11  .  ESOPHAGEAL MANOMETRY  10/27/2011   Procedure: ESOPHAGEAL MANOMETRY (EM);  Surgeon: Gatha Mayer, MD;  Location: [facility];  Service: Endoscopy;  Laterality: N/A;  . SEPTOPLASTY    . TUBAL LIGATION      Social History   Socioeconomic History  . Marital status: Married    Spouse name: Not on file  . Number of children: 2  . Years of education: Not on file  . Highest education level: Not on file  Occupational History  . Occupation: DISABLED  Tobacco Use  . Smoking status: Current Every Day Smoker    Packs/day: 0.50  . Smokeless tobacco: Never Used  . Tobacco comment: smoked 1967- present, up to 1 ppd; now 1/2-3/4 ppd  Vaping Use  . Vaping Use: Never used  Substance and Sexual Activity  . Alcohol use: No  . Drug use: No  . Sexual activity: Yes   Other Topics Concern  . Not on file  Social History Narrative    CA[employer]EINE 3 CUPS DAILY   Social Determinants of Health   Financial Resource Strain: Low Risk   . Difficulty of Paying Living Expenses: Not hard at all  Food Insecurity: No Food Insecurity  . Worried About Charity fundraiser in the Last Year: Never true  . Ran Out of Food in the Last Year: Never true  Transportation Needs: No Transportation Needs  . Lack of Transportation (Medical): No  . Lack of Transportation (Non-Medical): No  Physical Activity: Sufficiently Active  . Days of Exercise per Week: 5 days  . Minutes of Exercise per Session: 30 min  Stress: Stress Concern Present  . Feeling of Stress : Rather much  Social Connections: Socially Integrated  . Frequency of Communication with Friends and Family: More than three times a week  . Frequency of Social Gatherings with Friends and Family: Once a week  . Attends Religious Services: More than 4 times per year  . Active Member of Clubs or Organizations: No  . Attends Archivist Meetings: More than 4 times per year  . Marital Status: Married    Family History  Problem Relation Age of Onset  . Dementia Mother   . Ulcers Sister   . Stroke Sister         X 2; both > 65  . Esophageal cancer Sister   . Ulcers Brother   . Diabetes Paternal Uncle   . Cancer Maternal Grandmother        CNS  . Heart attack Brother         in 12s  . Cancer Brother        PANCREATIC   . Diabetes Brother        X 2  . Stroke Brother        X2 , both > 55    Review of Systems  Constitutional: Negative for chills and fever.  HENT: Positive for ear pain (Right). Negative for congestion, hearing loss, sinus pressure and sore throat.   Respiratory: Negative for cough, shortness of breath and wheezing.   Neurological: Positive for headaches (head pain). Negative for dizziness and light-headedness.       Objective:   Vitals:   11/26/20 1441  BP: 132/80  Pulse: 81   Temp: 98.4 F (36.9 C)  SpO2: 96%   BP Readings from Last 3 Encounters:  11/26/20 132/80  09/13/20 130/80  06/18/20 138/72   Wt Readings from Last 3 Encounters:  11/26/20 90 lb 3.2 oz (40.9 kg)  09/13/20 89 lb 3.2 oz (40.5 kg)  06/18/20 87 lb 12.8 oz (39.8 kg)   Body mass index is 16.5 kg/m.   Physical Exam Constitutional:      General: She is not in acute distress.    Appearance: Normal appearance. She is not ill-appearing.  HENT:     Head: Normocephalic and atraumatic.     Right Ear: External ear normal.     Left Ear: Tympanic membrane, ear canal and external ear normal.     Ears:     Comments: Right ear canal with mild erythema, no discharge or fluid, TM slightly dull w/o erythema, no inner ear lesions Skin:    General: Skin is warm.     Findings: No bruising, erythema, lesion or rash.  Neurological:     Mental Status: She is alert.     Sensory: No sensory deficit.     Comments: Tenderness on scalp with light palpation            Assessment & Plan:    See Problem List for Assessment and Plan of chronic medical problems.    This visit occurred during the SARS-CoV-2 public health emergency.  Safety protocols were in place, including screening questions prior to the visit, additional usage of staff PPE, and extensive cleaning of exam room while observing appropriate contact time as indicated for disinfecting solutions.

## 2020-11-28 ENCOUNTER — Telehealth: Payer: Self-pay | Admitting: Pharmacist

## 2020-11-28 ENCOUNTER — Encounter: Payer: Self-pay | Admitting: Internal Medicine

## 2020-11-28 MED ORDER — GABAPENTIN 100 MG PO CAPS
100.0000 mg | ORAL_CAPSULE | Freq: Every day | ORAL | 3 refills | Status: DC
Start: 2020-11-28 — End: 2021-11-07

## 2020-11-28 NOTE — Progress Notes (Signed)
error 

## 2020-12-04 ENCOUNTER — Telehealth: Payer: Medicare HMO

## 2020-12-04 NOTE — Progress Notes (Deleted)
Chronic Care Management Pharmacy Note  12/04/2020 Name:  Jennifer Stephenson MRN:  [id number] DOB:  [birth year]  Subjective: Jennifer Stephenson is an 70 y.o. year old female who is a primary patient of Burns, Jennifer Lick, MD.  The CCM team was consulted for assistance with disease management and care coordination needs.    Engaged with patient by telephone for follow up visit in response to provider referral for pharmacy case management and/or care coordination services.   Consent to Services:  The patient was given information about Chronic Care Management services, agreed to services, and gave verbal consent prior to initiation of services.  Please see initial visit note for detailed documentation.   Patient Care Team: Binnie Rail, MD as PCP - General (Internal Medicine) Charlton Haws, [facility] as Pharmacist (Pharmacist)  Recent office visits: 11/26/20 Dr Quay Burow OV: ear pain. rx'd amoxicilin, ear drops. Rx'd gabapentin for nerve pain.  08/03/20 Dr Quay Burow VV: covid positive 1/11.  Recent consult visits: ***  Hospital visits: {Hospital DC Yes/No:25215}  Objective:  Lab Results  Component Value Date   CREATININE 0.78 06/18/2020   BUN 12 06/18/2020   GFR 77.01 06/18/2020   GFRNONAA 92.18 05/09/2010   GFRAA 111 03/07/2008   NA 139 06/18/2020   K 4.4 06/18/2020   CALCIUM 9.9 06/18/2020   CO2 26 06/18/2020   GLUCOSE 83 06/18/2020    Lab Results  Component Value Date/Time   GFR 77.01 06/18/2020 10:52 AM   GFR 76.55 06/17/2019 08:21 AM    Last diabetic Eye exam: No results found for: HMDIABEYEEXA  Last diabetic Foot exam: No results found for: HMDIABFOOTEX   Lab Results  Component Value Date   CHOL 182 06/18/2020   HDL 61.00 06/18/2020   LDLCALC 106 (H) 06/18/2020   LDLDIRECT 148.6 06/06/2013   TRIG 76.0 06/18/2020   CHOLHDL 3 06/18/2020    Hepatic Function Latest Ref Rng & Units 06/18/2020 06/17/2019 06/03/2018  Total Protein 6.0 - 8.3 g/dL 7.7 7.3 7.6  Albumin 3.5  - 5.2 g/dL 4.6 4.5 4.6  AST 0 - 37 U/L _0 ALT 0 - 35 U/L _1 Alk Phosphatase 39 - 117 U/L 73 79 79  Total Bilirubin 0.2 - 1.2 mg/dL 0.7 0.8 0.8  Bilirubin, Direct 0.0 - 0.3 mg/dL - - -    Lab Results  Component Value Date/Time   TSH 1.43 06/18/2020 10:52 AM   TSH 1.33 06/17/2019 08:21 AM   FREET4 0.8 09/09/2006 02:12 PM    CBC Latest Ref Rng & Units 06/18/2020 06/17/2019 06/03/2018  WBC 4.0 - 10.5 K/uL 7.0 6.9 6.3  Hemoglobin 12.0 - 15.0 g/dL 15.9(H) 15.3(H) 16.3(H)  Hematocrit 36.0 - 46.0 % 46.0 45.4 46.9(H)  Platelets 150.0 - 400.0 K/uL 217.0 210.0 208.0    Lab Results  Component Value Date/Time   VD25OH 53.30 06/18/2020 10:52 AM   VD25OH 44.86 06/17/2019 08:21 AM    Clinical ASCVD: {YES/NO:21197} The 10-year ASCVD risk score Jennifer Stephenson DC Jr., et al., 2013) is: 20.1%   Values used to calculate the score:     Age: 12 years     Sex: Female     Is Non-Hispanic African American: No     Diabetic: No     Tobacco smoker: Yes     Systolic Blood Pressure: 353 mmHg     Is BP treated: Yes     HDL Cholesterol: 61 mg/dL     Total Cholesterol: 182 mg/dL  Depression screen [facility] 2/9 09/13/2020 06/13/2019 06/03/2018  Decreased Interest 1 0 0  Down, Depressed, Hopeless _0 PHQ - 2 Score _1 Altered sleeping - 1 1  Tired, decreased energy - 1 1  Change in appetite - 0 1  Feeling bad or failure about yourself  - 0 1  Trouble concentrating - 0 0  Moving slowly or fidgety/restless - 0 0  Suicidal thoughts - 0 0  PHQ-9 Score - 4 6  Difficult doing work/chores - Not difficult at all Somewhat difficult     ***Other: (CHADS2VASc if Afib, MMRC or CAT for COPD, ACT, DEXA)  Social History   Tobacco Use  Smoking Status Current Every Day Smoker  . Packs/day: 0.50  Smokeless Tobacco Never Used  Tobacco Comment   smoked 1967- present, up to 1 ppd; now 1/2-3/4 ppd   BP Readings from Last 3 Encounters:  11/26/20 132/80  09/13/20 130/80  06/18/20 138/72   Pulse  Readings from Last 3 Encounters:  11/26/20 81  09/13/20 78  06/18/20 72   Wt Readings from Last 3 Encounters:  11/26/20 90 lb 3.2 oz (40.9 kg)  09/13/20 89 lb 3.2 oz (40.5 kg)  06/18/20 87 lb 12.8 oz (39.8 kg)   BMI Readings from Last 3 Encounters:  11/26/20 16.50 kg/m  09/13/20 16.31 kg/m  06/18/20 16.06 kg/m    Assessment/Interventions: Review of patient past medical history, allergies, medications, health status, including review of consultants reports, laboratory and other test data, was performed as part of comprehensive evaluation and provision of chronic care management services.   SDOH:  (Social Determinants of Health) assessments and interventions performed: {yes/no:20286}  SDOH Screenings   Alcohol Screen: Low Risk   . Last Alcohol Screening Score (AUDIT): 0  Depression (PHQ2-9): Low Risk   . PHQ-2 Score: 2  Financial Resource Strain: Low Risk   . Difficulty of Paying Living Expenses: Not hard at all  Food Insecurity: No Food Insecurity  . Worried About Charity fundraiser in the Last Year: Never true  . Ran Out of Food in the Last Year: Never true  Housing: Low Risk   . Last Housing Risk Score: 0  Physical Activity: Sufficiently Active  . Days of Exercise per Week: 5 days  . Minutes of Exercise per Session: 30 min  Social Connections: Socially Integrated  . Frequency of Communication with Friends and Family: More than three times a week  . Frequency of Social Gatherings with Friends and Family: Once a week  . Attends Religious Services: More than 4 times per year  . Active Member of Clubs or Organizations: No  . Attends Archivist Meetings: More than 4 times per year  . Marital Status: Married  Stress: Stress Concern Present  . Feeling of Stress : Rather much  Tobacco Use: High Risk  . Smoking Tobacco Use: Current Every Day Smoker  . Smokeless Tobacco Use: Never Used  Transportation Needs: No Transportation Needs  . Lack of Transportation  (Medical): No  . Lack of Transportation (Non-Medical): No    CCM Care Plan  Allergies  Allergen Reactions  . Bupropion Hcl     rash  . Paroxetine     rash  . Pentazocine Lactate     ([place])hospitalized due to mental status changes  . Ranitidine Hives  . Lipitor [Atorvastatin]     09/05/13 leg pain as per phone call  . Pravastatin     Leg pain  . Prednisone  Nausea & cramps  . Sulfonamide Derivatives     nausea    Medications Reviewed Today    Reviewed by Marcina Millard, CMA (Certified Medical Assistant) on 11/26/20 at 1442  Med List Status: <None>  Medication Order Taking? Sig Documenting Provider Last Dose Status Informant  Calcium-Vitamin D 600-125 MG-UNIT TABS [id number] Yes Take by mouth daily. Take 2 [provider] Taking Active   diltiazem (TIADYLT ER) 180 MG 24 hr capsule [id number] Yes Take 1 capsule (180 mg total) by mouth daily. Binnie Rail, MD Taking Active   famotidine (PEPCID) 20 MG tablet [id number] Yes Take 1 tablet (20 mg total) by mouth 2 (two) times daily. Binnie Rail, MD Taking Active   GARLIC OIL PO [id number] Yes Take 1 tablet by mouth daily. [provider] Taking Active Self  LORazepam (ATIVAN) 1 MG tablet [id number] Yes Take 0.5 tablets (0.5 mg total) by mouth every 8 (eight) hours as needed for anxiety. Binnie Rail, MD Taking Active   meclizine (ANTIVERT) 12.5 MG tablet [id number] Yes Take 1-2 tablets (12.5-25 mg total) by mouth 3 (three) times daily as needed for dizziness. Binnie Rail, MD Taking Active   VENTOLIN HFA 108 [phone] Base) MCG/ACT inhaler [id number] Yes INHALE 2 PUFFS EVERY 6 (SIX) HOURS AS NEEDED INTO THE LUNGS FOR WHEEZING OR SHORTNESS OF BREATH. Binnie Rail, MD Taking Active   vitamin E 180 MG (400 UNITS) capsule [id number] Yes Take 400 Units by mouth daily. [provider] Taking Active           Patient Active Problem List   Diagnosis Date Noted  . Ear pain, right 11/26/2020  . Head pain 11/26/2020   . COVID 08/03/2020  . Lower abdominal pain 06/17/2019  . Reactive airway disease 05/18/2017  . Generalized anxiety disorder 05/18/2017  . Dizziness 04/28/2016  . Osteoporosis 10/04/2015  . Elevated hematocrit 06/10/2014  . GERD (gastroesophageal reflux disease) 10/25/2013  . Abnormal EKG 06/01/2012  . COLONIC POLYPS, HX OF 09/19/2008  . THYROID NODULE 03/28/2008  . Hyperlipidemia 03/07/2008  . Tobacco dependence due to cigarettes 03/07/2008  . Esophageal dysphagia 03/07/2008  . INTERSTITIAL CYSTITIS 07/29/2007  . Essential hypertension 05/27/2007    Immunization History  Administered Date(s) Administered  . Influenza Split 05/22/2011  . Influenza Whole 05/09/2010, 04/18/2012, 04/13/2013  . Influenza, High Dose Seasonal PF 04/28/2016, 04/20/2017, 04/13/2019, 05/08/2020  . Influenza,inj,Quad PF,6+ Mos 05/11/2015  . Influenza-Unspecified 04/17/2014, 04/13/2016, 04/20/2017  . Pneumococcal Conjugate-13 10/04/2015  . Pneumococcal Polysaccharide-23 10/20/2016  . Td 03/07/2008  . Tdap 03/29/2019    Conditions to be addressed/monitored:  {USCCMDZASSESSMENTOPTIONS:23563}  There are no care plans that you recently modified to display for this patient.    Medication Assistance: {MEDASSISTANCEINFO:25044}  Patient's preferred pharmacy is:  [id number] [place] [place] [place]Phone: [phone]: [phone] Uses pill box? {Yes or If no, why not?:20788} Pt endorses ***% compliance  We discussed: {Pharmacy options:24294} Patient decided to: {US Pharmacy Plan:23885}  Care Plan and Follow Up Patient Decision:  {FOLLOWUP:24991}  Plan: {CM FOLLOW UP PXFGH:82993} ***    Current Barriers:  . {pharmacybarriers:24917}  Pharmacist Clinical Goal(s):  .Marland KitchenPatient will {PHARMACYGOALCHOICES:24921} through collaboration with PharmD and provider.   Interventions: . 1:1  collaboration with BBinnie Rail MD regarding development and update of comprehensive plan of care as evidenced by provider attestation and co-signature . Inter-disciplinary care team  collaboration (see longitudinal plan of care) . Comprehensive medication review performed; medication list updated in electronic medical record  Hypertension   BP goal is:  <130/80 Patient checks BP at home infrequently Patient home BP readings are ranging: n/a   Patient has failed these meds in the past: n/a Patient is currently controlled on the following medications:   Diltiazem ER 180 mg daily  We discussed: BP goals; Pt reports hx palpitations - diltiazem is more for that than for BP;  Smoking cessation may help with HR and BP  Plan: Continue current medications and control with diet and exercise    Hyperlipidemia   LDL goal 30% reduction (<110)  Last lipids  Patient has failed these meds in past: atorvastatin, pravastatin (leg pain) Patient is currently controlled on the following medications:   Garlic supplement  We discussed:  Cholesterol goals; benefits of diet / exercise for ASCVD risk reduction  Plan: Continue control with diet and exercise  Anxiety / depression   Patient has failed these meds in past: bupropion (rash), paroxetine (rash) Patient is currently uncontrolled on the following medications:   Lorazepam 1 mg - 1/2 tab q8h PRN  We discussed: Pt is taking lorazepam more frequently since sister died. Never takes more than 1/2 tab per day.   -Discussed SSRI benefits again; discussed benefits of CBT and talking to her husband about her grief; pt declined SSRI today but may consider it in future  Plan Continue current medications  Pt is considering SSRI   GERD   Patient has failed these meds in past: zantac, omeprazole Patient is currently controlled on the following medications:   Famotidine 20 mg BID  We discussed: pt reports taking famotidine once  daily, she usually misses the PM dose; she reports symptoms are worse at night; advised she keep the bottle on her nightstand as a reminder to take it before bed  Plan: Continue current medications  Osteoporosis   Last DEXA Scan: 07/23/2020             T-Score femoral neck: -3.0             T-Score lumbar spine: -1.9  Patient is a candidate for pharmacologic treatment due to T-Score < -2.5 in femoral neck  Patient has failed these meds in past: n/a  Patient is currently uncontrolled on the following medications:   Calcium-Vitamin D 600-125 mg-IU 2 tab daily   We discussed:  Pt has tried to start Prolia in the past but insurance did not cover it; bisphosphonates are also an option but may exacerbate GERD -pt has repeat DEXA scan next week  Plan F/U DEXA scan results Recommend checking Prolia coverage in 2022 If not covered, may try alendronate weekly  Tobacco Abuse   reports MOTIVATION to quit is moderate reports CONFIDENCE in quitting is moderate  Previous quit attempts included: nicotine gum (quit x 7 days), patch makes her sick, lozenges are "nasty", [place] Quit-line  We discussed:  Importance of smoking cessation; pt has quit for 7 days in the past by using nicotine gum; she has used the quit Line before as well but did not like getting calls all the time especially if she slipped up; she is allergic to bupropion and is "scared" of Chantix; -pt is still having a hard time grieving her sister, she does not not think now is a good time to quit smoking  Plan: Continue to reassess readiness to quit  Headaches   Patient has failed these meds in  past: Tylenol Patient is currently controlled on the following medications:   Ibuprofen 200 mg - 4 tab as needed  We discussed:  Pt reports daily headaches since neck surgery in 2001; she has has multiple revisions and physical therapy, she reports her doctors have told her there is nothing else to be done; she uses ibuprofen  sparingly because she does not like to take it; NSAIDs are relatively safe to use given adequate kidney function, controlled BP and lack of drug interactions; advised she take it with food when she does take it  Plan: Continue current medications  Patient Goals/Self-Care Activities . Patient will:  - {pharmacypatientgoals:24919}  Follow Up Plan: {CM FOLLOW UP SHNG:87195}

## 2020-12-29 ENCOUNTER — Other Ambulatory Visit: Payer: Self-pay | Admitting: Internal Medicine

## 2021-01-23 DIAGNOSIS — R32 Unspecified urinary incontinence: Secondary | ICD-10-CM | POA: Diagnosis not present

## 2021-01-23 DIAGNOSIS — Z008 Encounter for other general examination: Secondary | ICD-10-CM | POA: Diagnosis not present

## 2021-01-23 DIAGNOSIS — G8929 Other chronic pain: Secondary | ICD-10-CM | POA: Diagnosis not present

## 2021-01-23 DIAGNOSIS — K08109 Complete loss of teeth, unspecified cause, unspecified class: Secondary | ICD-10-CM | POA: Diagnosis not present

## 2021-01-23 DIAGNOSIS — I10 Essential (primary) hypertension: Secondary | ICD-10-CM | POA: Diagnosis not present

## 2021-01-23 DIAGNOSIS — K219 Gastro-esophageal reflux disease without esophagitis: Secondary | ICD-10-CM | POA: Diagnosis not present

## 2021-01-23 DIAGNOSIS — H269 Unspecified cataract: Secondary | ICD-10-CM | POA: Diagnosis not present

## 2021-01-23 DIAGNOSIS — E785 Hyperlipidemia, unspecified: Secondary | ICD-10-CM | POA: Diagnosis not present

## 2021-01-23 DIAGNOSIS — R69 Illness, unspecified: Secondary | ICD-10-CM | POA: Diagnosis not present

## 2021-01-23 DIAGNOSIS — M810 Age-related osteoporosis without current pathological fracture: Secondary | ICD-10-CM | POA: Diagnosis not present

## 2021-01-25 ENCOUNTER — Other Ambulatory Visit: Payer: Self-pay | Admitting: Internal Medicine

## 2021-01-25 DIAGNOSIS — F411 Generalized anxiety disorder: Secondary | ICD-10-CM

## 2021-01-30 ENCOUNTER — Telehealth: Payer: Self-pay | Admitting: Pharmacist

## 2021-01-30 NOTE — Progress Notes (Signed)
Chronic Care Management Pharmacy Assistant   Name: Jennifer Stephenson  MRN: [id number] DOB: May 16, 1951   Reason for Encounter: Disease State   Conditions to be addressed/monitored: HTN   Recent office visits:  11/26/20 Dr. Quay Burow Internal Medicine -(Ear pain)Start cortisporin drops to right ear, amoxicillin 500 mg tid x 7 days  Recent consult visits:  None ID  Hospital visits:  None in previous 6 months  Medications: Outpatient Encounter Medications as of 01/30/2021  Medication Sig   Calcium-Vitamin D 600-125 MG-UNIT TABS Take by mouth daily. Take 2   diltiazem (TIADYLT ER) 180 MG 24 hr capsule Take 1 capsule (180 mg total) by mouth daily.   famotidine (PEPCID) 20 MG tablet TAKE 1 TABLET BY MOUTH TWICE A DAY   gabapentin (NEURONTIN) 100 MG capsule Take 1 capsule (100 mg total) by mouth at bedtime.   GARLIC OIL PO Take 1 tablet by mouth daily.   LORazepam (ATIVAN) 1 MG tablet TAKE 0.5 TABLETS BY MOUTH EVERY 8 HOURS AS NEEDED FOR ANXIETY.   meclizine (ANTIVERT) 12.5 MG tablet Take 1-2 tablets (12.5-25 mg total) by mouth 3 (three) times daily as needed for dizziness.   neomycin-polymyxin-hydrocortisone (CORTISPORIN) OTIC solution Place 4 drops into the right ear 4 (four) times daily.   VENTOLIN HFA 108 (90 Base) MCG/ACT inhaler INHALE 2 PUFFS EVERY 6 (SIX) HOURS AS NEEDED INTO THE LUNGS FOR WHEEZING OR SHORTNESS OF BREATH.   vitamin E 180 MG (400 UNITS) capsule Take 400 Units by mouth daily.   No facility-administered encounter medications on file as of 01/30/2021.    Pharmacist Review Reviewed chart prior to disease state call. Spoke with patient regarding BP  Recent Office Vitals: BP Readings from Last 3 Encounters:  11/26/20 132/80  09/13/20 130/80  06/18/20 138/72   Pulse Readings from Last 3 Encounters:  11/26/20 81  09/13/20 78  06/18/20 72    Wt Readings from Last 3 Encounters:  11/26/20 90 lb 3.2 oz (40.9 kg)  09/13/20 89 lb 3.2 oz (40.5 kg)  06/18/20 87 lb  12.8 oz (39.8 kg)     Kidney Function Lab Results  Component Value Date/Time   CREATININE 0.78 06/18/2020 10:52 AM   CREATININE 0.75 06/17/2019 08:21 AM   GFR 77.01 06/18/2020 10:52 AM   GFRNONAA 92.18 05/09/2010 10:36 AM   GFRAA 111 03/07/2008 12:00 AM    BMP Latest Ref Rng & Units 06/18/2020 06/17/2019 06/03/2018  Glucose 70 - 99 mg/dL 83 91 85  BUN 6 - 23 mg/dL 12 13 14   Creatinine 0.40 - 1.20 mg/dL 0.78 0.75 0.86  Sodium 135 - 145 mEq/L 139 138 139  Potassium 3.5 - 5.1 mEq/L 4.4 4.3 4.4  Chloride 96 - 112 mEq/L 105 104 105  CO2 19 - 32 mEq/L 26 25 24   Calcium 8.4 - 10.5 mg/dL 9.9 9.8 9.7    Current antihypertensive regimen:  Diltiazem 180 mg daily  How often are you checking your Blood Pressure?  Patient states that she checks blood pressure every so often, blood pressures have been good  Current home BP readings: Patient last blood pressure reading was 136/72  What recent interventions/DTPs have been made by any provider to improve Blood Pressure control since last CPP Visit: None noted  Any recent hospitalizations or ED visits since last visit with CPP? No  What diet changes have been made to improve Blood Pressure Control?  Patient states that she has not made any changes to diet, eats what she wants What  exercise is being done to improve your Blood Pressure Control?  Patient states that she walks for exercise  Adherence Review: Is the patient currently on ACE/ARB medication? No Does the patient have >5 day gap between last estimated fill dates? No  Diltiazem 180 mg daily   Star Rating Drugs: None ID  Ethelene Hal Clinical Pharmacist Assistant [phone]   Time spent:20

## 2021-03-26 DIAGNOSIS — H35341 Macular cyst, hole, or pseudohole, right eye: Secondary | ICD-10-CM | POA: Diagnosis not present

## 2021-03-26 DIAGNOSIS — H2513 Age-related nuclear cataract, bilateral: Secondary | ICD-10-CM | POA: Diagnosis not present

## 2021-03-26 DIAGNOSIS — H43393 Other vitreous opacities, bilateral: Secondary | ICD-10-CM | POA: Diagnosis not present

## 2021-04-30 DIAGNOSIS — H34832 Tributary (branch) retinal vein occlusion, left eye, with macular edema: Secondary | ICD-10-CM | POA: Diagnosis not present

## 2021-04-30 DIAGNOSIS — H43822 Vitreomacular adhesion, left eye: Secondary | ICD-10-CM | POA: Diagnosis not present

## 2021-04-30 DIAGNOSIS — H35413 Lattice degeneration of retina, bilateral: Secondary | ICD-10-CM | POA: Diagnosis not present

## 2021-04-30 DIAGNOSIS — H35341 Macular cyst, hole, or pseudohole, right eye: Secondary | ICD-10-CM | POA: Diagnosis not present

## 2021-05-06 ENCOUNTER — Telehealth: Payer: Self-pay | Admitting: Internal Medicine

## 2021-05-06 DIAGNOSIS — F411 Generalized anxiety disorder: Secondary | ICD-10-CM

## 2021-05-06 NOTE — Telephone Encounter (Signed)
Pt. Script for LORazepam (ATIVAN) 1 MG tablet was denied by Dr. Ronnald Ramp. She is requesting a refill. Pt. May need to schedule follow- up before refill.    [id number] [address] [place], [place] Phone:  [phone]  Fax:  [phone]

## 2021-05-07 ENCOUNTER — Other Ambulatory Visit: Payer: Self-pay | Admitting: Internal Medicine

## 2021-05-07 DIAGNOSIS — F411 Generalized anxiety disorder: Secondary | ICD-10-CM

## 2021-05-07 MED ORDER — LORAZEPAM 1 MG PO TABS: ORAL_TABLET | ORAL | 2 refills | Status: DC

## 2021-05-07 NOTE — Telephone Encounter (Signed)
Rx sent 

## 2021-05-14 DIAGNOSIS — H348322 Tributary (branch) retinal vein occlusion, left eye, stable: Secondary | ICD-10-CM | POA: Diagnosis not present

## 2021-05-14 DIAGNOSIS — H25013 Cortical age-related cataract, bilateral: Secondary | ICD-10-CM | POA: Diagnosis not present

## 2021-05-14 DIAGNOSIS — H35413 Lattice degeneration of retina, bilateral: Secondary | ICD-10-CM | POA: Diagnosis not present

## 2021-05-14 DIAGNOSIS — H35341 Macular cyst, hole, or pseudohole, right eye: Secondary | ICD-10-CM | POA: Diagnosis not present

## 2021-05-14 DIAGNOSIS — H2511 Age-related nuclear cataract, right eye: Secondary | ICD-10-CM | POA: Diagnosis not present

## 2021-05-14 DIAGNOSIS — H25043 Posterior subcapsular polar age-related cataract, bilateral: Secondary | ICD-10-CM | POA: Diagnosis not present

## 2021-05-14 DIAGNOSIS — H2513 Age-related nuclear cataract, bilateral: Secondary | ICD-10-CM | POA: Diagnosis not present

## 2021-05-20 DIAGNOSIS — H35411 Lattice degeneration of retina, right eye: Secondary | ICD-10-CM | POA: Diagnosis not present

## 2021-05-20 DIAGNOSIS — H35342 Macular cyst, hole, or pseudohole, left eye: Secondary | ICD-10-CM | POA: Diagnosis not present

## 2021-05-20 DIAGNOSIS — H35412 Lattice degeneration of retina, left eye: Secondary | ICD-10-CM | POA: Diagnosis not present

## 2021-05-20 DIAGNOSIS — H2511 Age-related nuclear cataract, right eye: Secondary | ICD-10-CM | POA: Diagnosis not present

## 2021-05-20 DIAGNOSIS — H35341 Macular cyst, hole, or pseudohole, right eye: Secondary | ICD-10-CM | POA: Diagnosis not present

## 2021-05-21 DIAGNOSIS — H35341 Macular cyst, hole, or pseudohole, right eye: Secondary | ICD-10-CM | POA: Diagnosis not present

## 2021-05-21 DIAGNOSIS — H31091 Other chorioretinal scars, right eye: Secondary | ICD-10-CM | POA: Diagnosis not present

## 2021-05-28 ENCOUNTER — Other Ambulatory Visit: Payer: Self-pay | Admitting: Internal Medicine

## 2021-05-28 DIAGNOSIS — H35341 Macular cyst, hole, or pseudohole, right eye: Secondary | ICD-10-CM | POA: Diagnosis not present

## 2021-06-18 DIAGNOSIS — H2512 Age-related nuclear cataract, left eye: Secondary | ICD-10-CM | POA: Diagnosis not present

## 2021-06-19 DIAGNOSIS — H43392 Other vitreous opacities, left eye: Secondary | ICD-10-CM | POA: Diagnosis not present

## 2021-06-19 DIAGNOSIS — H35341 Macular cyst, hole, or pseudohole, right eye: Secondary | ICD-10-CM | POA: Diagnosis not present

## 2021-06-19 DIAGNOSIS — H43822 Vitreomacular adhesion, left eye: Secondary | ICD-10-CM | POA: Diagnosis not present

## 2021-06-19 DIAGNOSIS — H31091 Other chorioretinal scars, right eye: Secondary | ICD-10-CM | POA: Diagnosis not present

## 2021-06-20 ENCOUNTER — Encounter: Payer: Self-pay | Admitting: Internal Medicine

## 2021-06-20 NOTE — Patient Instructions (Addendum)
Blood work was ordered.     Medications changes include : None   A thyroid ultrasound was ordered to follow-up your thyroid nodules.  Someone from the [place] imaging office will call you to schedule an appointment.    Please followup in 1 year   Health Maintenance, Female Adopting a healthy lifestyle and getting preventive care are important in promoting health and wellness. Ask your health care provider about: The right schedule for you to have regular tests and exams. Things you can do on your own to prevent diseases and keep yourself healthy. What should I know about diet, weight, and exercise? Eat a healthy diet  Eat a diet that includes plenty of vegetables, fruits, low-fat dairy products, and lean protein. Do not eat a lot of foods that are high in solid fats, added sugars, or sodium. Maintain a healthy weight Body mass index (BMI) is used to identify weight problems. It estimates body fat based on height and weight. Your health care provider can help determine your BMI and help you achieve or maintain a healthy weight. Get regular exercise Get regular exercise. This is one of the most important things you can do for your health. Most adults should: Exercise for at least 150 minutes each week. The exercise should increase your heart rate and make you sweat (moderate-intensity exercise). Do strengthening exercises at least twice a week. This is in addition to the moderate-intensity exercise. Spend less time sitting. Even light physical activity can be beneficial. Watch cholesterol and blood lipids Have your blood tested for lipids and cholesterol at 71 years of age, then have this test every 5 years. Have your cholesterol levels checked more often if: Your lipid or cholesterol levels are high. You are older than 71 years of age. You are at high risk for heart disease. What should I know about cancer screening? Depending on your health history and family history, you may  need to have cancer screening at various ages. This may include screening for: Breast cancer. Cervical cancer. Colorectal cancer. Skin cancer. Lung cancer. What should I know about heart disease, diabetes, and high blood pressure? Blood pressure and heart disease High blood pressure causes heart disease and increases the risk of stroke. This is more likely to develop in people who have high blood pressure readings or are overweight. Have your blood pressure checked: Every 3-5 years if you are 54-25 years of age. Every year if you are 76 years old or older. Diabetes Have regular diabetes screenings. This checks your fasting blood sugar level. Have the screening done: Once every three years after age 105 if you are at a normal weight and have a low risk for diabetes. More often and at a younger age if you are overweight or have a high risk for diabetes. What should I know about preventing infection? Hepatitis B If you have a higher risk for hepatitis B, you should be screened for this virus. Talk with your health care provider to find out if you are at risk for hepatitis B infection. Hepatitis C Testing is recommended for: Everyone born from [birth year] through 1966. Anyone with known risk factors for hepatitis C. Sexually transmitted infections (STIs) Get screened for STIs, including gonorrhea and chlamydia, if: You are sexually active and are younger than 71 years of age. You are older than 71 years of age and your health care provider tells you that you are at risk for this type of infection. Your sexual activity has changed since you were  last screened, and you are at increased risk for chlamydia or gonorrhea. Ask your health care provider if you are at risk. Ask your health care provider about whether you are at high risk for HIV. Your health care provider may recommend a prescription medicine to help prevent HIV infection. If you choose to take medicine to prevent HIV, you should first get  tested for HIV. You should then be tested every 3 months for as long as you are taking the medicine. Pregnancy If you are about to stop having your period (premenopausal) and you may become pregnant, seek counseling before you get pregnant. Take 400 to 800 micrograms (mcg) of folic acid every day if you become pregnant. Ask for birth control (contraception) if you want to prevent pregnancy. Osteoporosis and menopause Osteoporosis is a disease in which the bones lose minerals and strength with aging. This can result in bone fractures. If you are 84 years old or older, or if you are at risk for osteoporosis and fractures, ask your health care provider if you should: Be screened for bone loss. Take a calcium or vitamin D supplement to lower your risk of fractures. Be given hormone replacement therapy (HRT) to treat symptoms of menopause. Follow these instructions at home: Alcohol use Do not drink alcohol if: Your health care provider tells you not to drink. You are pregnant, may be pregnant, or are planning to become pregnant. If you drink alcohol: Limit how much you have to: 0-1 drink a day. Know how much alcohol is in your drink. In the [place] one drink equals one 12 oz bottle of beer (355 mL), one 5 oz glass of wine (148 mL), or one 1 oz glass of hard liquor (44 mL). Lifestyle Do not use any products that contain nicotine or tobacco. These products include cigarettes, chewing tobacco, and vaping devices, such as e-cigarettes. If you need help quitting, ask your health care provider. Do not use street drugs. Do not share needles. Ask your health care provider for help if you need support or information about quitting drugs. General instructions Schedule regular health, dental, and eye exams. Stay current with your vaccines. Tell your health care provider if: You often feel depressed. You have ever been abused or do not feel safe at home. Summary Adopting a healthy lifestyle and getting  preventive care are important in promoting health and wellness. Follow your health care provider's instructions about healthy diet, exercising, and getting tested or screened for diseases. Follow your health care provider's instructions on monitoring your cholesterol and blood pressure. This information is not intended to replace advice given to you by your health care provider. Make sure you discuss any questions you have with your health care provider. Document Revised: 11/19/2020 Document Reviewed: 11/19/2020 Elsevier Patient Education  [place].

## 2021-06-20 NOTE — Progress Notes (Signed)
Subjective:    Patient ID: Jennifer Stephenson, female    DOB: 1951-05-17, 70 y.o.   MRN: [id number]   This visit occurred during the SARS-CoV-2 public health emergency.  Safety protocols were in place, including screening questions prior to the visit, additional usage of staff PPE, and extensive cleaning of exam room while observing appropriate contact time as indicated for disinfecting solutions.    HPI She is here for a physical exam.   She had eye surgery recently  - she is still recovering.  It was very stressful and her anxiety level has been fired since then.  He is not taking the Ativan very often, but has been taking it more regularly since then.  Medications and allergies reviewed with patient and updated if appropriate.  Patient Active Problem List   Diagnosis Date Noted   Ear pain, right 11/26/2020   Head pain 11/26/2020   COVID 08/03/2020   Lower abdominal pain 06/17/2019   Reactive airway disease 05/18/2017   Generalized anxiety disorder 05/18/2017   Dizziness 04/28/2016   Osteoporosis 10/04/2015   Elevated hematocrit 06/10/2014   GERD (gastroesophageal reflux disease) 10/25/2013   Abnormal EKG 06/01/2012   COLONIC POLYPS, HX OF 09/19/2008   THYROID NODULE 03/28/2008   Hyperlipidemia 03/07/2008   Tobacco dependence due to cigarettes 03/07/2008   Esophageal dysphagia 03/07/2008   INTERSTITIAL CYSTITIS 07/29/2007   Essential hypertension 05/27/2007    Current Outpatient Medications on File Prior to Visit  Medication Sig Dispense Refill   Calcium-Vitamin D 600-125 MG-UNIT TABS Take by mouth daily. Take 2     famotidine (PEPCID) 20 MG tablet TAKE 1 TABLET BY MOUTH TWICE A DAY 180 tablet 1   gabapentin (NEURONTIN) 100 MG capsule Take 1 capsule (100 mg total) by mouth at bedtime. 30 capsule 3   GARLIC OIL PO Take 1 tablet by mouth daily.     LORazepam (ATIVAN) 1 MG tablet TAKE 0.5 TABLETS BY MOUTH EVERY 8 HOURS AS NEEDED FOR ANXIETY. 30 tablet 2   meclizine  (ANTIVERT) 12.5 MG tablet Take 1-2 tablets (12.5-25 mg total) by mouth 3 (three) times daily as needed for dizziness. 30 tablet 3   TIADYLT ER 180 MG 24 hr capsule TAKE 1 CAPSULE BY MOUTH EVERY DAY 90 capsule 3   vitamin E 180 MG (400 UNITS) capsule Take 400 Units by mouth daily.     No current facility-administered medications on file prior to visit.    Past Medical History:  Diagnosis Date   Adenomatous colon polyp 2007 & 2010   Allergy    seasonal   Anemia    PMH of    DDD (degenerative disc disease), cervical    Dysphagia    Endometriosis    Fibrocystic breast disease    Fracture of wrist, closed    bilaterally age 57 (  fell skating )   GERD (gastroesophageal reflux disease)    HTN (hypertension)    Hyperlipidemia    Interstitial cystitis    Osteopenia     DEXA   Syncope 2007   seen in ER ; Dx: dehydration    Past Surgical History:  Procedure Laterality Date   ABDOMINAL HYSTERECTOMY  1996   & BSO FOR ENDOMETRIOSIS   CERVICAL FUSION     X3:.2001 C4-6, 2007 C6-7,2012C3-4 (?)   COLONOSCOPY  2013   negative   COLONOSCOPY W/ POLYPECTOMY  2007 & 2011   ADENOMATOUS polyp, internal hemorrhoids   EGD WITH ESOPHAGEAL DILATION  05/15/2009,07/03/11  ESOPHAGEAL MANOMETRY  10/27/2011   Procedure: ESOPHAGEAL MANOMETRY (EM);  Surgeon: Gatha Mayer, MD;  Location: [facility];  Service: Endoscopy;  Laterality: N/A;   SEPTOPLASTY     TUBAL LIGATION      Social History   Socioeconomic History   Marital status: Married    Spouse name: Not on file   Number of children: 2   Years of education: Not on file   Highest education level: Not on file  Occupational History   Occupation: DISABLED  Tobacco Use   Smoking status: Every Day    Packs/day: 0.50    Types: Cigarettes   Smokeless tobacco: Never   Tobacco comments:    smoked 1967- present, up to 1 ppd; now 1/2-3/4 ppd  Vaping Use   Vaping Use: Never used  Substance and Sexual Activity   Alcohol use: No   Drug  use: No   Sexual activity: Yes  Other Topics Concern   Not on file  Social History Narrative    CAFFEINE 3 CUPS DAILY   Social Determinants of Health   Financial Resource Strain: Low Risk    Difficulty of Paying Living Expenses: Not hard at all  Food Insecurity: No Food Insecurity   Worried About Charity fundraiser in the Last Year: Never true   [place] in the Last Year: Never true  Transportation Needs: No Transportation Needs   Lack of Transportation (Medical): No   Lack of Transportation (Non-Medical): No  Physical Activity: Sufficiently Active   Days of Exercise per Week: 5 days   Minutes of Exercise per Session: 30 min  Stress: Stress Concern Present   Feeling of Stress : Rather much  Social Connections: Socially Integrated   Frequency of Communication with Friends and Family: More than three times a week   Frequency of Social Gatherings with Friends and Family: Once a week   Attends Religious Services: More than 4 times per year   Active Member of [employer] or Organizations: No   Attends Music therapist: More than 4 times per year   Marital Status: Married    Family History  Problem Relation Age of Onset   Dementia Mother    Ulcers Sister    Stroke Sister         X 2; both > 50   Esophageal cancer Sister    Ulcers Brother    Diabetes Paternal Uncle    Cancer Maternal Grandmother        CNS   Heart attack Brother         in 71s   Cancer Brother        PANCREATIC    Diabetes Brother        X 2   Stroke Brother        X2 , both > 55    Review of Systems  Constitutional:  Negative for chills and fever.  Eyes:  Positive for visual disturbance (related to recent eye surgery).  Respiratory:  Positive for cough (increased since covid), chest tightness (occ since covid) and shortness of breath (occ). Negative for wheezing.   Cardiovascular:  Positive for palpitations. Negative for chest pain and leg swelling.  Gastrointestinal:  Negative for  abdominal pain, blood in stool, constipation, diarrhea and nausea.  Genitourinary:  Negative for dysuria.  Musculoskeletal:  Positive for arthralgias. Negative for back pain.  Skin:  Negative for color change and rash.  Neurological:  Positive for light-headedness (occ). Negative for headaches.  Psychiatric/Behavioral:  Negative for dysphoric mood. Hallucinations: hip pain.The patient is nervous/anxious.       Objective:   Vitals:   06/21/21 0805  BP: 110/70  Pulse: 88  Temp: 98 F (36.7 C)  SpO2: 97%   Filed Weights   06/21/21 0805  Weight: 86 lb (39 kg)   Body mass index is 15.73 kg/m.  BP Readings from Last 3 Encounters:  06/21/21 110/70  11/26/20 132/80  09/13/20 130/80    Wt Readings from Last 3 Encounters:  06/21/21 86 lb (39 kg)  11/26/20 90 lb 3.2 oz (40.9 kg)  09/13/20 89 lb 3.2 oz (40.5 kg)    Depression screen [facility] 2/9 09/13/2020 06/13/2019 06/03/2018 05/18/2017 04/28/2016  Decreased Interest 1 0 0 0 0  Down, Depressed, Hopeless 1 2 2  0 0  PHQ - 2 Score 2 2 2  0 0  Altered sleeping - 1 1 - -  Tired, decreased energy - 1 1 - -  Change in appetite - 0 1 - -  Feeling bad or failure about yourself  - 0 1 - -  Trouble concentrating - 0 0 - -  Moving slowly or fidgety/restless - 0 0 - -  Suicidal thoughts - 0 0 - -  PHQ-9 Score - 4 6 - -  Difficult doing work/chores - Not difficult at all Somewhat difficult - -     No flowsheet data found.     Physical Exam Constitutional: She appears thin. No distress.  HENT:  Head: Normocephalic and atraumatic.  Right Ear: External ear normal. Normal ear canal and TM Left Ear: External ear normal.  Normal ear canal and TM Mouth/Throat: Oropharynx is clear and moist.  Eyes: Conjunctivae and EOM are normal.  Neck: Neck supple. No tracheal deviation present. No thyromegaly present.  No carotid bruit  Cardiovascular: Normal rate, regular rhythm and normal heart sounds.   No murmur heard.  No edema. Pulmonary/Chest:  Effort normal and breath sounds normal. No respiratory distress. She has no wheezes. She has no rales.  Breast: deferred   Abdominal: Soft. She exhibits no distension. There is no tenderness.  Lymphadenopathy: She has no cervical adenopathy.  Skin: Skin is warm and dry. She is not diaphoretic.  Psychiatric: She has a normal mood and affect. Her behavior is normal.     Lab Results  Component Value Date   WBC 7.0 06/18/2020   HGB 15.9 (H) 06/18/2020   HCT 46.0 06/18/2020   PLT 217.0 06/18/2020   GLUCOSE 83 06/18/2020   CHOL 182 06/18/2020   TRIG 76.0 06/18/2020   HDL 61.00 06/18/2020   LDLDIRECT 148.6 06/06/2013   LDLCALC 106 (H) 06/18/2020   ALT 14 06/18/2020   AST 18 06/18/2020   NA 139 06/18/2020   K 4.4 06/18/2020   CL 105 06/18/2020   CREATININE 0.78 06/18/2020   BUN 12 06/18/2020   CO2 26 06/18/2020   TSH 1.43 06/18/2020         Assessment & Plan:   Physical exam: Screening blood work  ordered Exercise  walking Weight  on low side - encouraged weight gain Substance abuse  none   Reviewed recommended immunizations.   Health Maintenance  Topic Date Due   Zoster Vaccines- Shingrix (1 of 2) 09/19/2021 (Originally 02/24/2000)   MAMMOGRAM  09/11/2021   COLONOSCOPY (Pts 45-50yrs Insurance coverage will need to be confirmed)  10/30/2021   DEXA SCAN  07/23/2022   TETANUS/TDAP  03/28/2029   Pneumonia Vaccine 78+ Years old  Completed   INFLUENZA VACCINE  Completed   Hepatitis C Screening  Completed   HPV VACCINES  Aged Out   COVID-19 Vaccine  Discontinued          See Problem List for Assessment and Plan of chronic medical problems.

## 2021-06-21 ENCOUNTER — Encounter: Payer: Self-pay | Admitting: Internal Medicine

## 2021-06-21 ENCOUNTER — Ambulatory Visit (INDEPENDENT_AMBULATORY_CARE_PROVIDER_SITE_OTHER): Payer: Medicare HMO | Admitting: Internal Medicine

## 2021-06-21 ENCOUNTER — Other Ambulatory Visit: Payer: Self-pay

## 2021-06-21 VITALS — BP 110/70 | HR 88 | Temp 98.0°F | Ht 62.0 in | Wt 86.0 lb

## 2021-06-21 DIAGNOSIS — F411 Generalized anxiety disorder: Secondary | ICD-10-CM | POA: Diagnosis not present

## 2021-06-21 DIAGNOSIS — E041 Nontoxic single thyroid nodule: Secondary | ICD-10-CM

## 2021-06-21 DIAGNOSIS — K219 Gastro-esophageal reflux disease without esophagitis: Secondary | ICD-10-CM | POA: Diagnosis not present

## 2021-06-21 DIAGNOSIS — I10 Essential (primary) hypertension: Secondary | ICD-10-CM

## 2021-06-21 DIAGNOSIS — M810 Age-related osteoporosis without current pathological fracture: Secondary | ICD-10-CM | POA: Diagnosis not present

## 2021-06-21 DIAGNOSIS — J4520 Mild intermittent asthma, uncomplicated: Secondary | ICD-10-CM

## 2021-06-21 DIAGNOSIS — Z0000 Encounter for general adult medical examination without abnormal findings: Secondary | ICD-10-CM | POA: Diagnosis not present

## 2021-06-21 DIAGNOSIS — E7849 Other hyperlipidemia: Secondary | ICD-10-CM | POA: Diagnosis not present

## 2021-06-21 DIAGNOSIS — R69 Illness, unspecified: Secondary | ICD-10-CM | POA: Diagnosis not present

## 2021-06-21 DIAGNOSIS — F17210 Nicotine dependence, cigarettes, uncomplicated: Secondary | ICD-10-CM

## 2021-06-21 LAB — LIPID PANEL
Cholesterol: 164 mg/dL (ref 0–200)
HDL: 57.4 mg/dL (ref >39.00)
LDL Cholesterol: 96 mg/dL (ref 0–99)
NonHDL: 107.09
Total CHOL/HDL Ratio: 3
Triglycerides: 54 mg/dL (ref 0.0–149.0)
VLDL: 10.8 mg/dL (ref 0.0–40.0)

## 2021-06-21 LAB — CBC WITH DIFFERENTIAL/PLATELET
Basophils Absolute: 0.1 10*3/uL (ref 0.0–0.1)
Basophils Relative: 1 % (ref 0.0–3.0)
Eosinophils Absolute: 0.1 10*3/uL (ref 0.0–0.7)
Eosinophils Relative: 1.7 % (ref 0.0–5.0)
HCT: 45.3 % (ref 36.0–46.0)
Hemoglobin: 15.5 g/dL — ABNORMAL HIGH (ref 12.0–15.0)
Lymphocytes Relative: 21.8 % (ref 12.0–46.0)
Lymphs Abs: 1.5 10*3/uL (ref 0.7–4.0)
MCHC: 34.2 g/dL (ref 30.0–36.0)
MCV: 89.4 fl (ref 78.0–100.0)
Monocytes Absolute: 0.6 10*3/uL (ref 0.1–1.0)
Monocytes Relative: 8.1 % (ref 3.0–12.0)
Neutro Abs: 4.6 10*3/uL (ref 1.4–7.7)
Neutrophils Relative %: 67.4 % (ref 43.0–77.0)
Platelets: 218 10*3/uL (ref 150.0–400.0)
RBC: 5.07 Mil/uL (ref 3.87–5.11)
RDW: 14.2 % (ref 11.5–15.5)
WBC: 6.8 10*3/uL (ref 4.0–10.5)

## 2021-06-21 LAB — COMPREHENSIVE METABOLIC PANEL
ALT: 13 U/L (ref 0–35)
AST: 19 U/L (ref 0–37)
Albumin: 4.4 g/dL (ref 3.5–5.2)
Alkaline Phosphatase: 66 U/L (ref 39–117)
BUN: 16 mg/dL (ref 6–23)
CO2: 25 mEq/L (ref 19–32)
Calcium: 9.8 mg/dL (ref 8.4–10.5)
Chloride: 105 mEq/L (ref 96–112)
Creatinine, Ser: 0.78 mg/dL (ref 0.40–1.20)
GFR: 76.47 mL/min (ref >60.00)
Glucose, Bld: 85 mg/dL (ref 70–99)
Potassium: 4.6 mEq/L (ref 3.5–5.1)
Sodium: 137 mEq/L (ref 135–145)
Total Bilirubin: 0.6 mg/dL (ref 0.2–1.2)
Total Protein: 7.4 g/dL (ref 6.0–8.3)

## 2021-06-21 LAB — TSH: TSH: 1.21 u[IU]/mL (ref 0.35–5.50)

## 2021-06-21 MED ORDER — ALBUTEROL SULFATE HFA 108 (90 BASE) MCG/ACT IN AERS: INHALATION_SPRAY | RESPIRATORY_TRACT | 8 refills | Status: DC

## 2021-06-21 NOTE — Assessment & Plan Note (Addendum)
Chronic Increased anxiety due to eye surgery Taking lorazepam as needed only and not on a regular basis Continue lorazepam 1 mg- every 8 hours as needed

## 2021-06-21 NOTE — Assessment & Plan Note (Addendum)
Chronic GERD controlled Continue famotidine 20 mg twice daily-often forgets the second dose.  Discussed that she could take the second dose earlier or take 2 together-40 mg once a day and I can change the prescription if needed

## 2021-06-21 NOTE — Assessment & Plan Note (Addendum)
Chronic Has been smoking since 1967 Has tried nicotine gum, lozenges, patches and is seeing a hypnotist Anxiety level is higher at this time and she does not feel she could quit at this time

## 2021-06-21 NOTE — Assessment & Plan Note (Signed)
Chronic °Regular exercise and healthy diet encouraged °Check lipid panel  °Lifestyle controlled °

## 2021-06-21 NOTE — Assessment & Plan Note (Signed)
Chronic Last ultrasound done 1 year ago, which marked 2 years of stability.  5-year stability recommended Ultrasound ordered Check TSH

## 2021-06-21 NOTE — Assessment & Plan Note (Signed)
Chronic Blood pressure well controlled CMP Continue tiadylt ER 180 mg daily

## 2021-06-21 NOTE — Assessment & Plan Note (Signed)
Chronic Intermittent Albuterol inhaler as needed

## 2021-06-21 NOTE — Assessment & Plan Note (Addendum)
Chronic DEXA up-to-date Discussed that she should consider treatment Continue calcium and vitamin D daily Vitamin D level has been in the normal range Stressed regular exercise, fall prevention

## 2021-06-24 ENCOUNTER — Ambulatory Visit
Admission: RE | Admit: 2021-06-24 | Discharge: 2021-06-24 | Disposition: A | Discharge status: Home / Self Care | Payer: Medicare HMO | Source: Ambulatory Visit | Attending: Internal Medicine | Admitting: Internal Medicine

## 2021-06-24 DIAGNOSIS — E042 Nontoxic multinodular goiter: Secondary | ICD-10-CM | POA: Diagnosis not present

## 2021-06-24 DIAGNOSIS — E041 Nontoxic single thyroid nodule: Secondary | ICD-10-CM | POA: Diagnosis not present

## 2021-06-26 ENCOUNTER — Telehealth: Payer: Self-pay

## 2021-06-26 NOTE — Chronic Care Management (AMB) (Signed)
° ° °  Chronic Care Management Pharmacy Assistant   Name: Jennifer Stephenson  MRN: [id number] DOB: 1950-07-23   Reason for Encounter: Disease State-General    Recent office visits:  06/21/21 Binnie Rail, MD-PCP (Preventative health) Blood work was ordered.  Medications changes include : None  Recent consult visits:  None ID  Hospital visits:  None in previous 6 months  Medications: Outpatient Encounter Medications as of 06/26/2021  Medication Sig   albuterol (VENTOLIN HFA) 108 (90 Base) MCG/ACT inhaler INHALE 2 PUFFS EVERY 6 (SIX) HOURS AS NEEDED INTO THE LUNGS FOR WHEEZING OR SHORTNESS OF BREATH.   Calcium-Vitamin D 600-125 MG-UNIT TABS Take by mouth daily. Take 2   famotidine (PEPCID) 20 MG tablet TAKE 1 TABLET BY MOUTH TWICE A DAY   gabapentin (NEURONTIN) 100 MG capsule Take 1 capsule (100 mg total) by mouth at bedtime.   GARLIC OIL PO Take 1 tablet by mouth daily.   LORazepam (ATIVAN) 1 MG tablet TAKE 0.5 TABLETS BY MOUTH EVERY 8 HOURS AS NEEDED FOR ANXIETY.   meclizine (ANTIVERT) 12.5 MG tablet Take 1-2 tablets (12.5-25 mg total) by mouth 3 (three) times daily as needed for dizziness.   TIADYLT ER 180 MG 24 hr capsule TAKE 1 CAPSULE BY MOUTH EVERY DAY   vitamin E 180 MG (400 UNITS) capsule Take 400 Units by mouth daily.   No facility-administered encounter medications on file as of 06/26/2021.   Have you had any problems recently with your health?Patient stated that is doing well. She stated that she will be having eye surgery soon.  Have you had any problems with your pharmacy?Patient states that she does not have any problems with getting her medications or the cost of medications from the pharmacy. She did want to know why Dr. Quay Burow changed her blood pressure medication to brand name which she has to pay for.  What issues or side effects are you having with your medications?Patient states that she is not having any side effects to medications but did say that she often  forget to take her famotidine because she does not like the taste and would to try something else.  What would you like me to pass along to [facility] for them to help you with? Patient states that she is doing well  What can we do to take care of you better? Patient states that she does not need anything at this time  Care Gaps: Colonoscopy-NA Diabetic Foot Exam-NA Mammogram-NA Ophthalmology-NA Dexa Scan - NA Annual Well Visit - NA Micro albumin-NA Hemoglobin A1c-NA  Star Rating Drugs: None ID  [facility] Pharmacist Assistant [phone]

## 2021-07-16 ENCOUNTER — Encounter: Payer: Self-pay | Admitting: Internal Medicine

## 2021-08-02 DIAGNOSIS — H2512 Age-related nuclear cataract, left eye: Secondary | ICD-10-CM | POA: Diagnosis not present

## 2021-08-02 DIAGNOSIS — H2513 Age-related nuclear cataract, bilateral: Secondary | ICD-10-CM | POA: Diagnosis not present

## 2021-08-08 ENCOUNTER — Other Ambulatory Visit: Payer: Self-pay

## 2021-08-08 ENCOUNTER — Encounter: Payer: Self-pay | Admitting: Nurse Practitioner

## 2021-08-08 ENCOUNTER — Ambulatory Visit (INDEPENDENT_AMBULATORY_CARE_PROVIDER_SITE_OTHER): Payer: Medicare HMO | Admitting: Nurse Practitioner

## 2021-08-08 VITALS — BP 147/63 | HR 64 | Temp 97.6°F | Ht 62.0 in | Wt 88.0 lb

## 2021-08-08 DIAGNOSIS — R21 Rash and other nonspecific skin eruption: Secondary | ICD-10-CM | POA: Diagnosis not present

## 2021-08-08 MED ORDER — CLOTRIMAZOLE-BETAMETHASONE 1-0.05 % EX CREA: 1.0000 "application " | TOPICAL_CREAM | Freq: Every day | CUTANEOUS | 0 refills | Status: DC

## 2021-08-08 NOTE — Progress Notes (Signed)
Subjective:  Patient ID: Jennifer Stephenson, female    DOB: 12-12-1950  Age: 71 y.o. MRN: [id number]  CC:  Chief Complaint  Patient presents with   Rash      HPI  This patient arrives today for the above.  Rash erupted about 4 days ago.  It is located on the right lateral aspect of her mid abdomen.  It is pruritic.  Only recent exposures to new substances on her skin include gain detergent and Dial soap.  She tells me she is used again and Dial in the past without issues.  She denies any fevers or night sweats.  Denies any exposure to known sick contacts.  Has not had any recent infections.  Has not noticed any bug bites or any knowledge of tick bite.  No pain.  She tells me the base of the rash originally presented as red, she also tells me it feels scaly.  She has been applying over-the-counter cortisone cream with improvement in the redness and itching.  Past Medical History:  Diagnosis Date   Adenomatous colon polyp 2007 & 2010   Allergy    seasonal   Anemia    PMH of    DDD (degenerative disc disease), cervical    Dysphagia    Endometriosis    Fibrocystic breast disease    Fracture of wrist, closed    bilaterally age 89 (  fell skating )   GERD (gastroesophageal reflux disease)    HTN (hypertension)    Hyperlipidemia    Interstitial cystitis    Osteopenia    [place] DEXA   Syncope 2007   seen in ER ; Dx: dehydration      Family History  Problem Relation Age of Onset   Dementia Mother    Ulcers Sister    Stroke Sister         X 2; both > 75   Esophageal cancer Sister    Ulcers Brother    Diabetes Paternal Uncle    Cancer Maternal Grandmother        CNS   Heart attack Brother         in 33s   Cancer Brother        PANCREATIC    Diabetes Brother        X 2   Stroke Brother        X2 , both > 55    Social History   Social History Narrative    CAFFEINE 3 CUPS DAILY   Social History   Tobacco Use   Smoking status: Every Day    Packs/day:  0.50    Types: Cigarettes   Smokeless tobacco: Never   Tobacco comments:    smoked 1967- present, up to 1 ppd; now 1/2-3/4 ppd  Substance Use Topics   Alcohol use: No     Current Meds  Medication Sig   albuterol (VENTOLIN HFA) 108 (90 Base) MCG/ACT inhaler INHALE 2 PUFFS EVERY 6 (SIX) HOURS AS NEEDED INTO THE LUNGS FOR WHEEZING OR SHORTNESS OF BREATH.   Calcium-Vitamin D 600-125 MG-UNIT TABS Take by mouth daily. Take 2   clotrimazole-betamethasone (LOTRISONE) cream Apply 1 application topically daily.   famotidine (PEPCID) 20 MG tablet TAKE 1 TABLET BY MOUTH TWICE A DAY   gabapentin (NEURONTIN) 100 MG capsule Take 1 capsule (100 mg total) by mouth at bedtime.   GARLIC OIL PO Take 1 tablet by mouth daily.   LORazepam (ATIVAN) 1 MG tablet TAKE 0.5  TABLETS BY MOUTH EVERY 8 HOURS AS NEEDED FOR ANXIETY.   meclizine (ANTIVERT) 12.5 MG tablet Take 1-2 tablets (12.5-25 mg total) by mouth 3 (three) times daily as needed for dizziness.   TIADYLT ER 180 MG 24 hr capsule TAKE 1 CAPSULE BY MOUTH EVERY DAY   vitamin E 180 MG (400 UNITS) capsule Take 400 Units by mouth daily.    ROS:  See HPI   Objective:   Today's Vitals: BP (!) 147/63 (BP Location: Left Arm, Patient Position: Sitting, Cuff Size: Normal)    Pulse 64    Temp 97.6 F (36.4 C) (Oral)    Ht 5\' 2"  (1.575 m)    Wt 88 lb (39.9 kg)    SpO2 98%    BMI 16.10 kg/m  Vitals with BMI 08/08/2021 06/21/2021 11/26/2020  Height 5\' 2"  5\' 2"  5\' 2"   Weight 88 lbs 86 lbs 90 lbs 3 oz  BMI 16.09 82.50 03.70  Systolic 488 891 694  Diastolic 63 70 80  Pulse 64 88 81     Physical Exam Vitals reviewed.  Constitutional:      General: She is not in acute distress.    Appearance: Normal appearance.  HENT:     Head: Normocephalic and atraumatic.  Neck:     Vascular: No carotid bruit.  Cardiovascular:     Rate and Rhythm: Normal rate and regular rhythm.     Pulses: Normal pulses.     Heart sounds: Normal heart sounds.  Pulmonary:     Effort:  Pulmonary effort is normal.     Breath sounds: Normal breath sounds.  Musculoskeletal:     Cervical back: No muscular tenderness.  Skin:    General: Skin is warm and dry.       Neurological:     General: No focal deficit present.     Mental Status: She is alert and oriented to person, place, and time.  Psychiatric:        Mood and Affect: Mood normal.        Behavior: Behavior normal.        Judgment: Judgment normal.         Assessment and Plan   1. Rash      Plan: 1.  Rash of unclear etiology.  Does not appear to be shingles type rash does not appear to be a bull's-eye type rash seen with Lyme disease.  It looks almost like an eczema or contact dermatitis.  For now we will treat with Lotrisone cream, patient was told if symptoms persist or worsen over the next 2 weeks and she should notify us and we will refer to dermatology.  I offered referral to dermatology today, however she declined.    Tests ordered No orders of the defined types were placed in this encounter.     Meds ordered this encounter  Medications   clotrimazole-betamethasone (LOTRISONE) cream    Sig: Apply 1 application topically daily.    Dispense:  30 g    Refill:  0    Order Specific Question:   Supervising Provider    Answer:   Binnie Rail [id number]    Patient to follow-up as needed.  [name], NP

## 2021-08-17 IMAGING — [facility] DIGITAL DIAGNOSTIC UNILATERAL RIGHT MAMMOGRAM WITH TOMO AND CAD
4 series · 4 of 12 positions shown · non-contrast
Comparison: 09/03/2018 and earlier

CLINICAL DATA: First six-month follow-up for probably benign
asymmetry in the RIGHT breast.

EXAM:
DIGITAL DIAGNOSTIC UNILATERAL RIGHT MAMMOGRAM WITH CAD AND TOMO

[R CC synth-2D]
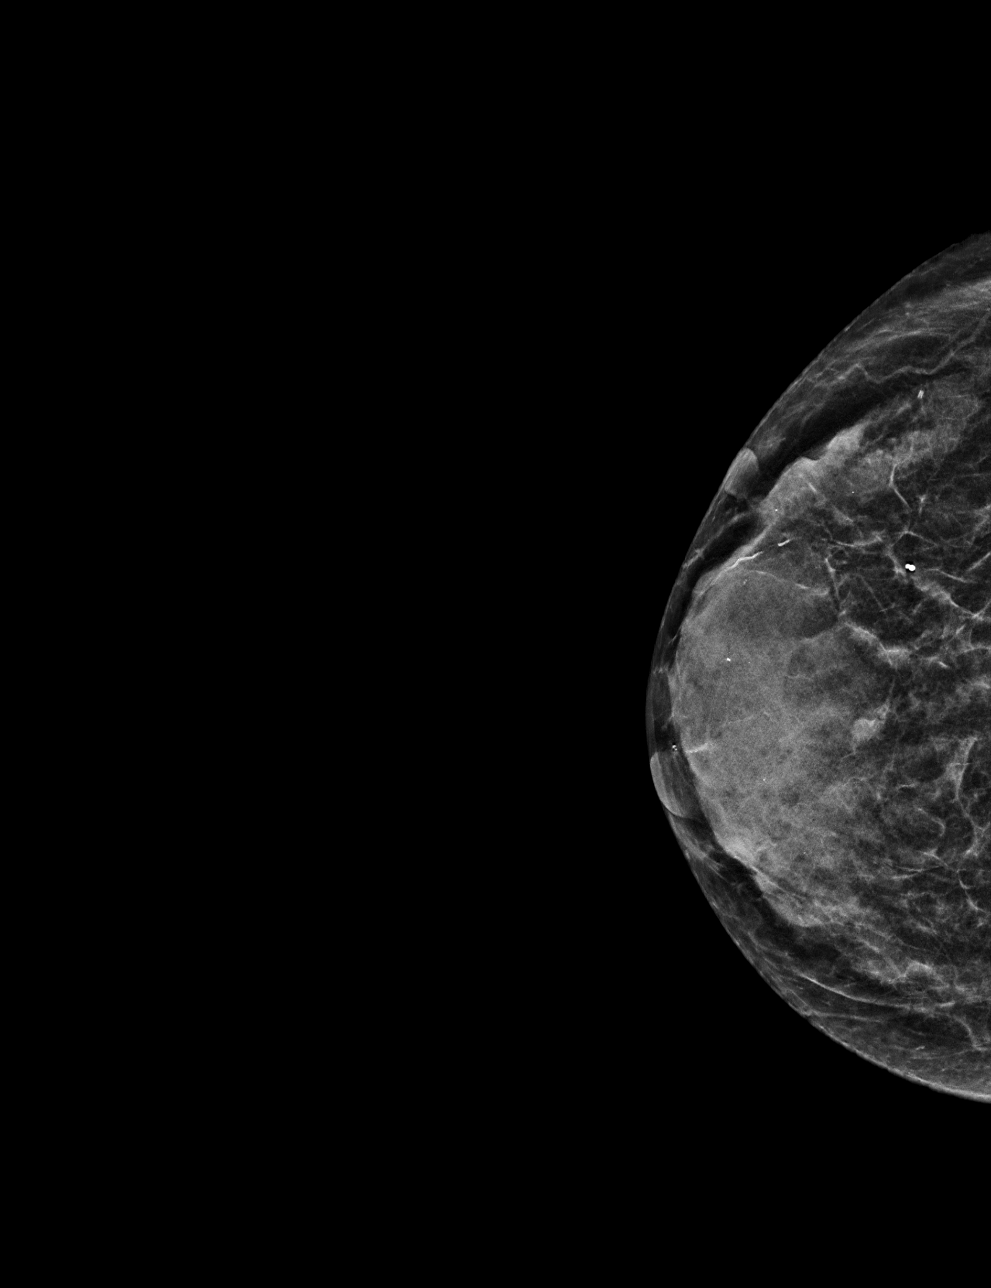

[R MLO synth-2D]
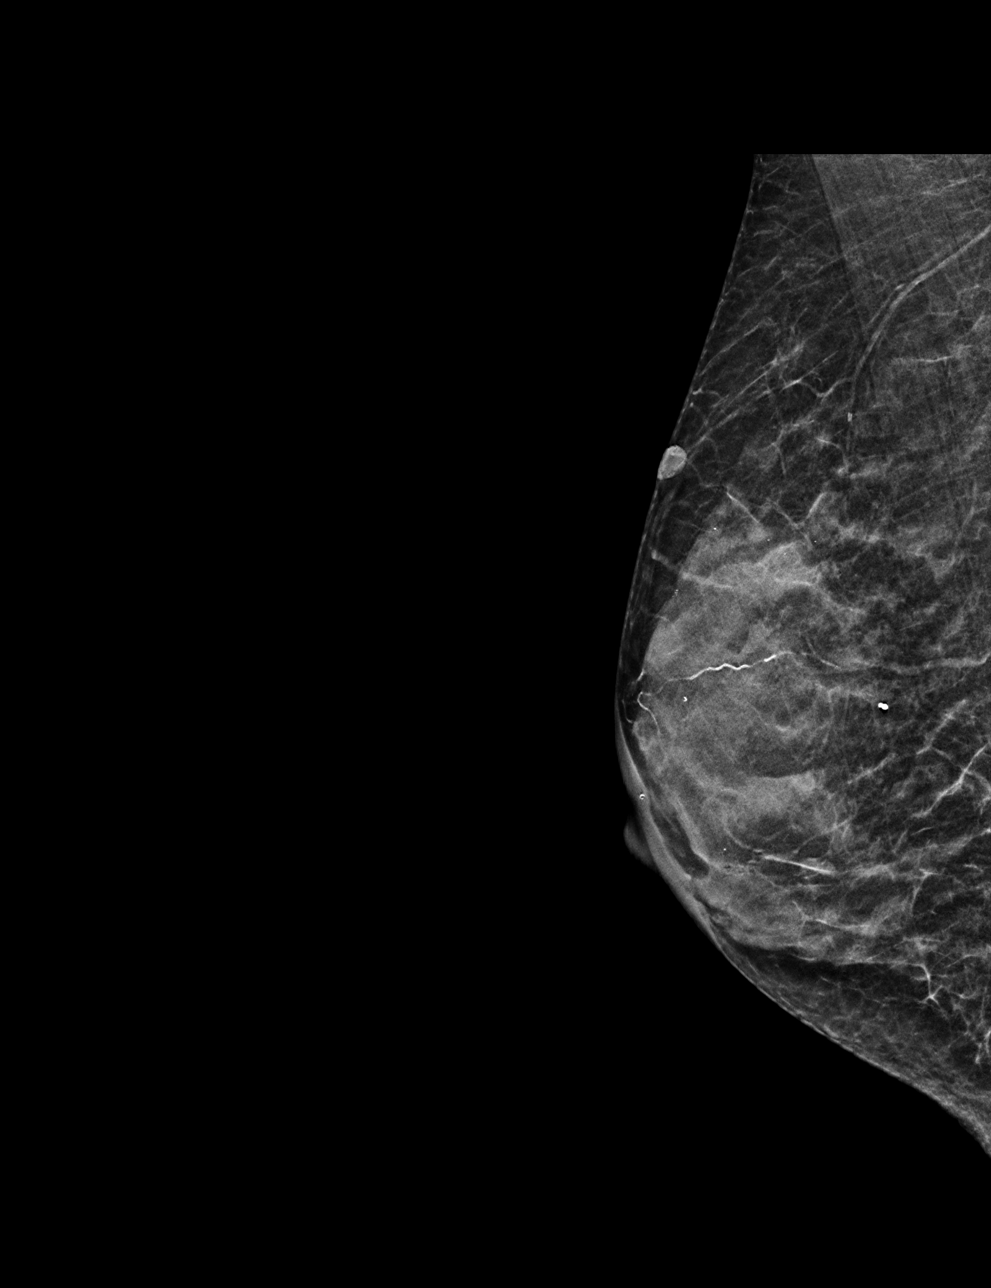

[R MLO tomo · tomo slice 15/30.0]
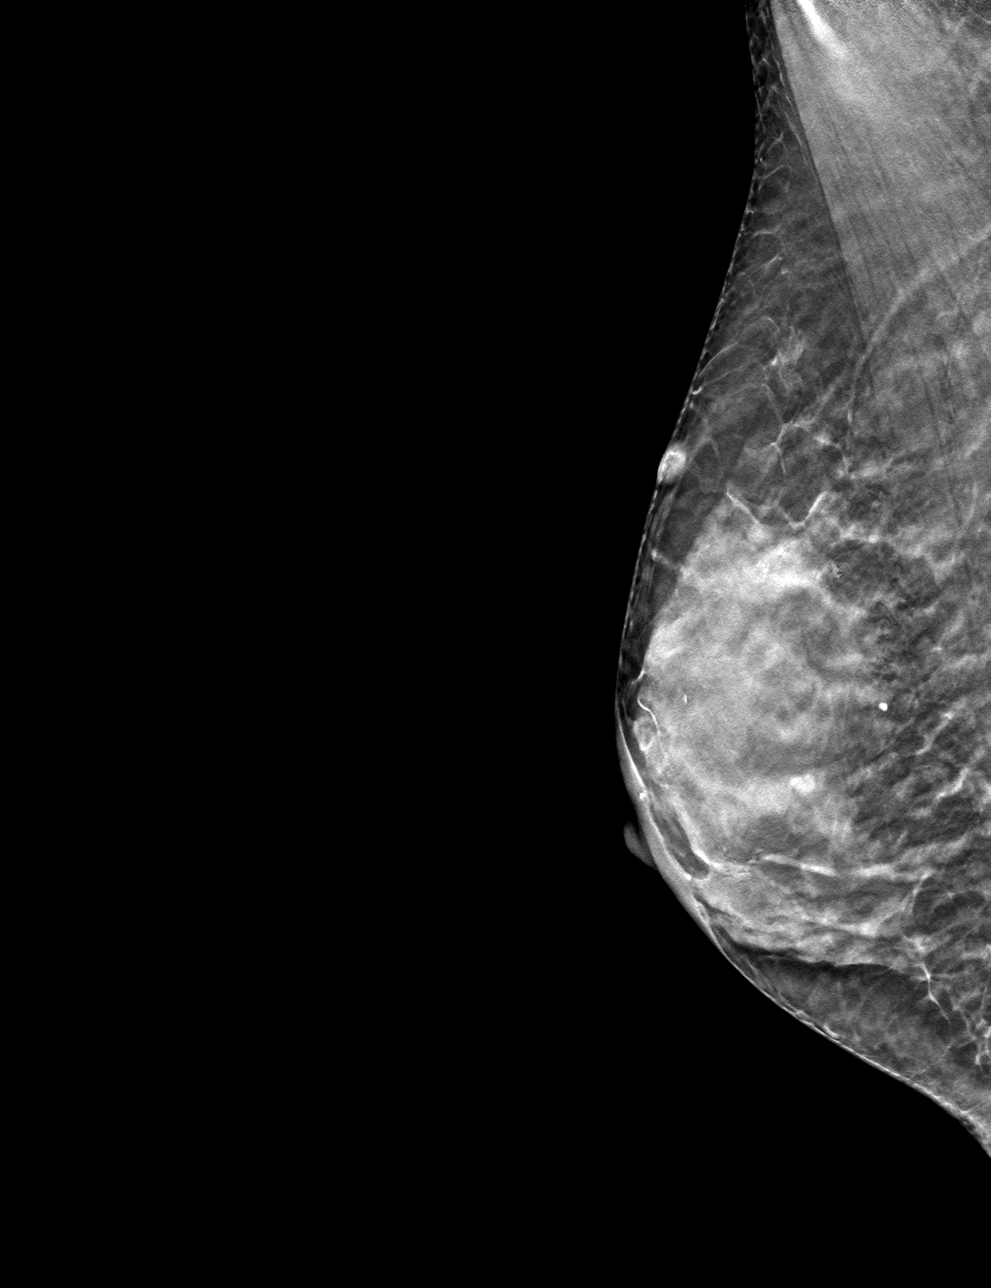

[R CC tomo · tomo slice 17/32.0]
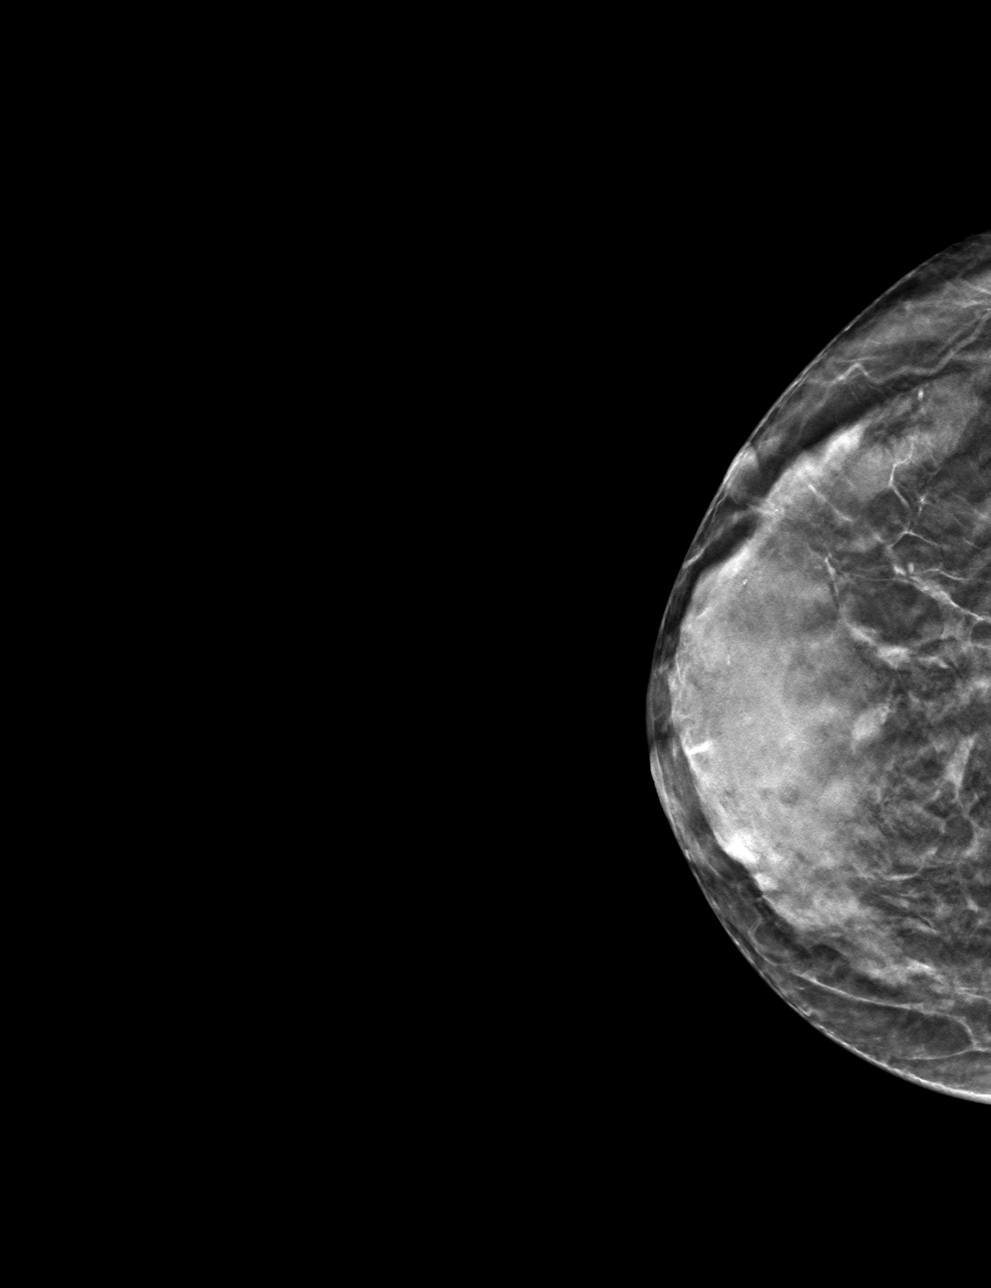

[4 of 12 positions shown; findings below may reference images not displayed]

ACR Breast Density Category d: The breast tissue is extremely dense,
which lowers the sensitivity of mammography.
FINDINGS: Persistent asymmetry in the superior portion of the RIGHT breast
appear stable compared with prior study. There is no sonographic
correlate for this finding on prior study.

Mammographic images were processed with CAD.
IMPRESSION: Stable appearance of probable asymmetric tissue in the superior
portion of the RIGHT breast. No mammographic evidence for
malignancy.

RECOMMENDATION:
Bilateral diagnostic mammogram is recommended in 6 months.

I have discussed the findings and recommendations with the patient.
Results were also provided in writing at the conclusion of the
visit. If applicable, a reminder letter will be sent to the patient
regarding the next appointment.

BI-RADS CATEGORY  3: Probably benign.

## 2021-09-03 DIAGNOSIS — H524 Presbyopia: Secondary | ICD-10-CM | POA: Diagnosis not present

## 2021-09-10 ENCOUNTER — Other Ambulatory Visit: Payer: Self-pay | Admitting: Obstetrics and Gynecology

## 2021-09-10 DIAGNOSIS — Z09 Encounter for follow-up examination after completed treatment for conditions other than malignant neoplasm: Secondary | ICD-10-CM

## 2021-09-10 DIAGNOSIS — Z01419 Encounter for gynecological examination (general) (routine) without abnormal findings: Secondary | ICD-10-CM | POA: Diagnosis not present

## 2021-09-16 ENCOUNTER — Ambulatory Visit (INDEPENDENT_AMBULATORY_CARE_PROVIDER_SITE_OTHER): Payer: Medicare HMO

## 2021-09-16 ENCOUNTER — Other Ambulatory Visit: Payer: Self-pay

## 2021-09-16 VITALS — BP 118/70 | HR 79 | Temp 98.6°F | Ht 62.0 in | Wt 88.8 lb

## 2021-09-16 DIAGNOSIS — Z1211 Encounter for screening for malignant neoplasm of colon: Secondary | ICD-10-CM | POA: Diagnosis not present

## 2021-09-16 DIAGNOSIS — Z0000 Encounter for general adult medical examination without abnormal findings: Secondary | ICD-10-CM

## 2021-09-16 NOTE — Patient Instructions (Addendum)
Jennifer Stephenson , Thank you for taking time to come for your Medicare Wellness Visit. I appreciate your ongoing commitment to your health goals. Please review the following plan we discussed and let me know if I can assist you in the future.   Screening recommendations/referrals: Colonoscopy: 10/31/2011; due every 10 years (Dr. Carlean Purl) Mammogram: 09/12/2019; due every 1-2 years Bone Density: 07/23/2020; due every 2 years Recommended yearly ophthalmology/optometry visit for glaucoma screening and checkup Recommended yearly dental visit for hygiene and checkup  Vaccinations: Influenza vaccine: 04/08/2021 Pneumococcal vaccine: 10/04/2015, 10/20/2016 Tdap vaccine: 03/29/2019; du every 10 years Shingles vaccine: never done   Covid-19: never done  Advanced directives: Please bring a copy of your health care power of attorney and living will to the office at your convenience.  Conditions/risks identified: Yes; Client understands the importance of follow-up appointments with providers by attending scheduled visits and discussed goals to eat healthier, increase physical activity 5 times a week for 30 minutes each, exercise the brain by doing stimulating brain exercises (reading, adult coloring, crafting, listening to music, puzzles, etc.), socialize and enjoy life more, get enough sleep at least 8-9 hours average per night and make time for laughter.  Next appointment: Please schedule your next Medicare Wellness Visit with your Nurse Health Advisor in 1 year by calling [phone].   Preventive Care 72 Years and Older, Female Preventive care refers to lifestyle choices and visits with your health care provider that can promote health and wellness. What does preventive care include? A yearly physical exam. This is also called an annual well check. Dental exams once or twice a year. Routine eye exams. Ask your health care provider how often you should have your eyes checked. Personal lifestyle choices,  including: Daily care of your teeth and gums. Regular physical activity. Eating a healthy diet. Avoiding tobacco and drug use. Limiting alcohol use. Practicing safe sex. Taking low-dose aspirin every day. Taking vitamin and mineral supplements as recommended by your health care provider. What happens during an annual well check? The services and screenings done by your health care provider during your annual well check will depend on your age, overall health, lifestyle risk factors, and family history of disease. Counseling  Your health care provider may ask you questions about your: Alcohol use. Tobacco use. Drug use. Emotional well-being. Home and relationship well-being. Sexual activity. Eating habits. History of falls. Memory and ability to understand (cognition). Work and work Statistician. Reproductive health. Screening  You may have the following tests or measurements: Height, weight, and BMI. Blood pressure. Lipid and cholesterol levels. These may be checked every 5 years, or more frequently if you are over 64 years old. Skin check. Lung cancer screening. You may have this screening every year starting at age 13 if you have a 30-pack-year history of smoking and currently smoke or have quit within the past 15 years. Fecal occult blood test (FOBT) of the stool. You may have this test every year starting at age 7. Flexible sigmoidoscopy or colonoscopy. You may have a sigmoidoscopy every 5 years or a colonoscopy every 10 years starting at age 41. Hepatitis C blood test. Hepatitis B blood test. Sexually transmitted disease (STD) testing. Diabetes screening. This is done by checking your blood sugar (glucose) after you have not eaten for a while (fasting). You may have this done every 1-3 years. Bone density scan. This is done to screen for osteoporosis. You may have this done starting at age 41. Mammogram. This may be done every 1-2  years. Talk to your health care provider  about how often you should have regular mammograms. Talk with your health care provider about your test results, treatment options, and if necessary, the need for more tests. Vaccines  Your health care provider may recommend certain vaccines, such as: Influenza vaccine. This is recommended every year. Tetanus, diphtheria, and acellular pertussis (Tdap, Td) vaccine. You may need a Td booster every 10 years. Zoster vaccine. You may need this after age 71. Pneumococcal 13-valent conjugate (PCV13) vaccine. One dose is recommended after age 23. Pneumococcal polysaccharide (PPSV23) vaccine. One dose is recommended after age 89. Talk to your health care provider about which screenings and vaccines you need and how often you need them. This information is not intended to replace advice given to you by your health care provider. Make sure you discuss any questions you have with your health care provider. Document Released: 07/27/2015 Document Revised: 03/19/2016 Document Reviewed: 05/01/2015 Elsevier Interactive Patient Education  2017 [place] Prevention in the Home Falls can cause injuries. They can happen to people of all ages. There are many things you can do to make your home safe and to help prevent falls. What can I do on the outside of my home? Regularly fix the edges of walkways and driveways and fix any cracks. Remove anything that might make you trip as you walk through a door, such as a raised step or threshold. Trim any bushes or trees on the path to your home. Use bright outdoor lighting. Clear any walking paths of anything that might make someone trip, such as rocks or tools. Regularly check to see if handrails are loose or broken. Make sure that both sides of any steps have handrails. Any raised decks and porches should have guardrails on the edges. Have any leaves, snow, or ice cleared regularly. Use sand or salt on walking paths during winter. Clean up any spills in  your garage right away. This includes oil or grease spills. What can I do in the bathroom? Use night lights. Install grab bars by the toilet and in the tub and shower. Do not use towel bars as grab bars. Use non-skid mats or decals in the tub or shower. If you need to sit down in the shower, use a plastic, non-slip stool. Keep the floor dry. Clean up any water that spills on the floor as soon as it happens. Remove soap buildup in the tub or shower regularly. Attach bath mats securely with double-sided non-slip rug tape. Do not have throw rugs and other things on the floor that can make you trip. What can I do in the bedroom? Use night lights. Make sure that you have a light by your bed that is easy to reach. Do not use any sheets or blankets that are too big for your bed. They should not hang down onto the floor. Have a firm chair that has side arms. You can use this for support while you get dressed. Do not have throw rugs and other things on the floor that can make you trip. What can I do in the kitchen? Clean up any spills right away. Avoid walking on wet floors. Keep items that you use a lot in easy-to-reach places. If you need to reach something above you, use a strong step stool that has a grab bar. Keep electrical cords out of the way. Do not use floor polish or wax that makes floors slippery. If you must use wax, use non-skid floor  wax. Do not have throw rugs and other things on the floor that can make you trip. What can I do with my stairs? Do not leave any items on the stairs. Make sure that there are handrails on both sides of the stairs and use them. Fix handrails that are broken or loose. Make sure that handrails are as long as the stairways. Check any carpeting to make sure that it is firmly attached to the stairs. Fix any carpet that is loose or worn. Avoid having throw rugs at the top or bottom of the stairs. If you do have throw rugs, attach them to the floor with carpet  tape. Make sure that you have a light switch at the top of the stairs and the bottom of the stairs. If you do not have them, ask someone to add them for you. What else can I do to help prevent falls? Wear shoes that: Do not have high heels. Have rubber bottoms. Are comfortable and fit you well. Are closed at the toe. Do not wear sandals. If you use a stepladder: Make sure that it is fully opened. Do not climb a closed stepladder. Make sure that both sides of the stepladder are locked into place. Ask someone to hold it for you, if possible. Clearly mark and make sure that you can see: Any grab bars or handrails. First and last steps. Where the edge of each step is. Use tools that help you move around (mobility aids) if they are needed. These include: Canes. Walkers. Scooters. Crutches. Turn on the lights when you go into a dark area. Replace any light bulbs as soon as they burn out. Set up your furniture so you have a clear path. Avoid moving your furniture around. If any of your floors are uneven, fix them. If there are any pets around you, be aware of where they are. Review your medicines with your doctor. Some medicines can make you feel dizzy. This can increase your chance of falling. Ask your doctor what other things that you can do to help prevent falls. This information is not intended to replace advice given to you by your health care provider. Make sure you discuss any questions you have with your health care provider. Document Released: 04/26/2009 Document Revised: 12/06/2015 Document Reviewed: 08/04/2014 Elsevier Interactive Patient Education  2017 [employer].

## 2021-09-16 NOTE — Progress Notes (Signed)
Subjective:   Jennifer Stephenson is a 71 y.o. female who presents for Medicare Annual (Subsequent) preventive examination.  Review of Systems     Cardiac Risk Factors include: advanced age (>18mn, >>5women);dyslipidemia;family history of premature cardiovascular disease;hypertension     Objective:    Today's Vitals   09/16/21 0838  BP: 118/70  Pulse: 79  Temp: 98.6 F (37 C)  SpO2: 97%  Weight: 88 lb 12.8 oz (40.3 kg)  Height: '5\' 2"'$  (1.575 m)  PainSc: 0-No pain   Body mass index is 16.24 kg/m.  Advanced Directives 09/16/2021 09/13/2020 06/13/2019 06/03/2018  Does Patient Have a Medical Advance Directive? Yes Yes No No  Type of Advance Directive Living will;Healthcare Power of Attorney Living will;Healthcare Power of Attorney - -  Does patient want to make changes to medical advance directive? No - Patient declined No - Patient declined No - Patient declined -  Copy of [place] Chart? No - copy requested No - copy requested - -  Would patient like information on creating a medical advance directive? - - - Yes (ED - Information included in AVS)    Current Medications (verified) Outpatient Encounter Medications as of 09/16/2021  Medication Sig   albuterol (VENTOLIN HFA) 108 (90 Base) MCG/ACT inhaler INHALE 2 PUFFS EVERY 6 (SIX) HOURS AS NEEDED INTO THE LUNGS FOR WHEEZING OR SHORTNESS OF BREATH.   Calcium-Vitamin D 600-125 MG-UNIT TABS Take by mouth daily. Take 2   clotrimazole-betamethasone (LOTRISONE) cream Apply 1 application topically daily.   famotidine (PEPCID) 20 MG tablet TAKE 1 TABLET BY MOUTH TWICE A DAY   gabapentin (NEURONTIN) 100 MG capsule Take 1 capsule (100 mg total) by mouth at bedtime.   GARLIC OIL PO Take 1 tablet by mouth daily.   ketorolac (ACULAR) 0.5 % ophthalmic solution Place 1 drop into the left eye 4 (four) times daily.   LORazepam (ATIVAN) 1 MG tablet TAKE 0.5 TABLETS BY MOUTH EVERY 8 HOURS AS NEEDED FOR ANXIETY.   meclizine  (ANTIVERT) 12.5 MG tablet Take 1-2 tablets (12.5-25 mg total) by mouth 3 (three) times daily as needed for dizziness.   moxifloxacin (VIGAMOX) 0.5 % ophthalmic solution Place 1 drop into the left eye 4 (four) times daily.   prednisoLONE acetate (PRED FORTE) 1 % ophthalmic suspension Place 1 drop into the left eye 4 (four) times daily.   TIADYLT ER 180 MG 24 hr capsule TAKE 1 CAPSULE BY MOUTH EVERY DAY   vitamin E 180 MG (400 UNITS) capsule Take 400 Units by mouth daily.   No facility-administered encounter medications on file as of 09/16/2021.    Allergies (verified) Bupropion hcl, Paroxetine, Pentazocine lactate, Ranitidine, Lipitor [atorvastatin], Pravastatin, Prednisone, and Sulfonamide derivatives   History: Past Medical History:  Diagnosis Date   Adenomatous colon polyp 2007 & 2010   Allergy    seasonal   Anemia    PMH of    DDD (degenerative disc disease), cervical    Dysphagia    Endometriosis    Fibrocystic breast disease    Fracture of wrist, closed    bilaterally age 71(  fell skating )   GERD (gastroesophageal reflux disease)    HTN (hypertension)    Hyperlipidemia    Interstitial cystitis    Osteopenia    Tina DEXA   Syncope 2007   seen in ER ; Dx: dehydration   Past Surgical History:  Procedure Laterality Date   ABDOMINAL HYSTERECTOMY  1996   & BSO  FOR ENDOMETRIOSIS   CERVICAL FUSION     X3:.2001 C4-6, 2007 C6-7,2012C3-4 (?)   COLONOSCOPY  2013   negative   COLONOSCOPY W/ POLYPECTOMY  2007 & 2011   ADENOMATOUS polyp, internal hemorrhoids   EGD WITH ESOPHAGEAL DILATION  05/15/2009,07/03/11   ESOPHAGEAL MANOMETRY  10/27/2011   Procedure: ESOPHAGEAL MANOMETRY (EM);  Surgeon: Gatha Mayer, MD;  Location: [facility];  Service: Endoscopy;  Laterality: N/A;   SEPTOPLASTY     TUBAL LIGATION     Family History  Problem Relation Age of Onset   Dementia Mother    Ulcers Sister    Stroke Sister         X 2; both > 89   Esophageal cancer Sister    Ulcers  Brother    Diabetes Paternal Uncle    Cancer Maternal Grandmother        CNS   Heart attack Brother         in 78s   Cancer Brother        PANCREATIC    Diabetes Brother        X 2   Stroke Brother        X2 , both > 17   Social History   Socioeconomic History   Marital status: Married    Spouse name: Not on file   Number of children: 2   Years of education: Not on file   Highest education level: Not on file  Occupational History   Occupation: DISABLED  Tobacco Use   Smoking status: Every Day    Packs/day: 0.50    Types: Cigarettes   Smokeless tobacco: Never   Tobacco comments:    smoked 1967- present, up to 1 ppd; now 1/2-3/4 ppd  Vaping Use   Vaping Use: Never used  Substance and Sexual Activity   Alcohol use: No   Drug use: No   Sexual activity: Yes  Other Topics Concern   Not on file  Social History Narrative    CAFFEINE 3 CUPS DAILY   Social Determinants of Health   Financial Resource Strain: Low Risk    Difficulty of Paying Living Expenses: Not hard at all  Food Insecurity: No Food Insecurity   Worried About Charity fundraiser in the Last Year: Never true   [place] in the Last Year: Never true  Transportation Needs: No Transportation Needs   Lack of Transportation (Medical): No   Lack of Transportation (Non-Medical): No  Physical Activity: Sufficiently Active   Days of Exercise per Week: 5 days   Minutes of Exercise per Session: 30 min  Stress: Stress Concern Present   Feeling of Stress : To some extent  Social Connections: Engineer, building services of Communication with Friends and Family: More than three times a week   Frequency of Social Gatherings with Friends and Family: Once a week   Attends Religious Services: More than 4 times per year   Active Member of [employer] or Organizations: No   Attends Music therapist: More than 4 times per year   Marital Status: Married    Tobacco Counseling Ready to quit: Not  Answered Counseling given: Not Answered Tobacco comments: smoked 1967- present, up to 1 ppd; now 1/2-3/4 ppd   Clinical Intake:  Pre-visit preparation completed: Yes  Pain : No/denies pain Pain Score: 0-No pain     BMI - recorded: 16.24 Nutritional Status: BMI <19  Underweight Nutritional Risks: None Diabetes: No  How often do you need to have someone help you when you read instructions, pamphlets, or other written materials from your doctor or pharmacy?: 1 - Never What is the last grade level you completed in school?: GED  Diabetic? no  Interpreter Needed?: No  Information entered by :: Lisette Abu, LPN   Activities of Daily Living In your present state of health, do you have any difficulty performing the following activities: 09/16/2021  Hearing? N  Vision? N  Difficulty concentrating or making decisions? N  Walking or climbing stairs? N  Dressing or bathing? N  Doing errands, shopping? N  Preparing Food and eating ? N  Using the Toilet? N  In the past six months, have you accidently leaked urine? N  Do you have problems with loss of bowel control? N  Managing your Medications? N  Managing your Finances? N  Housekeeping or managing your Housekeeping? N  Some recent data might be hidden    Patient Care Team: Binnie Rail, MD as PCP - General (Internal Medicine) Charlton Haws, [facility] as Pharmacist (Pharmacist) Laurence Aly, [place] (Optometry) Darleen Crocker, MD as Consulting Physician (Ophthalmology) Sherlynn Stalls, MD as Consulting Physician (Ophthalmology) Gatha Mayer, MD as Consulting Physician (Gastroenterology) Bobbye Charleston, MD as Consulting Physician (Obstetrics and Gynecology)  Indicate any recent Medical Services you may have received from other than [facility] providers in the past year (date may be approximate).     Assessment:   This is a routine wellness examination for Daveda.  Hearing/Vision screen Hearing Screening - Comments::  Patient denied any hearing difficulty.   No hearing aids.  Vision Screening - Comments:: Patient does not wear any corrective lenses/contacts.  Had cataracts removed and macular degeneration  Eye exam done by: Darleen Crocker, MD and Sherlynn Stalls, MD.  Dietary issues and exercise activities discussed: Current Exercise Habits: Home exercise routine, Type of exercise: walking, Time (Minutes): 30, Frequency (Times/Week): 5, Weekly Exercise (Minutes/Week): 150, Intensity: Moderate, Exercise limited by: None identified   Goals Addressed             This Visit's Progress    Quit Smoking        Depression Screen PHQ 2/9 Scores 09/16/2021 09/13/2020 06/13/2019 06/03/2018 05/18/2017 04/28/2016 06/06/2013  PHQ - 2 Score 0 '2 2 2 '$ 0 0 2  PHQ- 9 Score - - 4 6 - - 5    Fall Risk Fall Risk  09/16/2021 09/13/2020 06/18/2020 06/17/2019 06/13/2019  Falls in the past year? 0 0 0 1 0  Number falls in past yr: 0 0 0 0 0  Injury with Fall? 0 0 0 - 0  Risk for fall due to : No Fall Risks No Fall Risks History of fall(s);No Fall Risks - -  Follow up Falls evaluation completed Falls evaluation completed Falls evaluation completed - -    FALL RISK PREVENTION PERTAINING TO THE HOME:  Any stairs in or around the home? No  If so, are there any without handrails? No  Home free of loose throw rugs in walkways, pet beds, electrical cords, etc? Yes  Adequate lighting in your home to reduce risk of falls? Yes   ASSISTIVE DEVICES UTILIZED TO PREVENT FALLS:  Life alert? No  Use of a cane, walker or w/c? No  Grab bars in the bathroom? No  Shower chair or bench in shower? No  Elevated toilet seat or a handicapped toilet? Yes   TIMED UP AND GO:  Was the test performed? Yes .  Length of time to ambulate 10 feet: 6 sec.   Gait steady and fast without use of assistive device  Cognitive Function: Normal cognitive status assessed by direct observation by this Nurse Health Advisor. No abnormalities found.           Immunizations Immunization History  Administered Date(s) Administered   Influenza Split 05/22/2011   Influenza Whole 05/09/2010, 04/18/2012, 04/13/2013   Influenza, High Dose Seasonal PF 04/28/2016, 04/20/2017, 04/13/2019, 05/08/2020, 04/08/2021   Influenza,inj,Quad PF,6+ Mos 05/11/2015   Influenza-Unspecified 04/17/2014, 04/13/2016, 04/20/2017   Pneumococcal Conjugate-13 10/04/2015   Pneumococcal Polysaccharide-23 10/20/2016   Td 03/07/2008   Tdap 03/29/2019    TDAP status: Up to date  Flu Vaccine status: Up to date  Pneumococcal vaccine status: Up to date  Covid-19 vaccine status: Declined, Education has been provided regarding the importance of this vaccine but patient still declined. Advised may receive this vaccine at local pharmacy or Health Dept.or vaccine clinic. Aware to provide a copy of the vaccination record if obtained from local pharmacy or Health Dept. Verbalized acceptance and understanding.  Qualifies for Shingles Vaccine? Yes   Zostavax completed No   Shingrix Completed?: No.    Education has been provided regarding the importance of this vaccine. Patient has been advised to call insurance company to determine out of pocket expense if they have not yet received this vaccine. Advised may also receive vaccine at local pharmacy or Health Dept. Verbalized acceptance and understanding.  Screening Tests Health Maintenance  Topic Date Due   MAMMOGRAM  09/11/2021   Zoster Vaccines- Shingrix (1 of 2) 09/19/2021 (Originally 02/24/2000)   COLONOSCOPY (Pts 45-66yr Insurance coverage will need to be confirmed)  10/30/2021   DEXA SCAN  07/23/2022   TETANUS/TDAP  03/28/2029   Pneumonia Vaccine 72 Years old  Completed   INFLUENZA VACCINE  Completed   Hepatitis C Screening  Completed   HPV VACCINES  Aged Out   COVID-19 Vaccine  Discontinued    Health Maintenance  Health Maintenance Due  Topic Date Due   MAMMOGRAM  09/11/2021    Colorectal cancer screening:  Referral to GI placed 09/16/2021. Pt aware the office will call re: appt.  Mammogram status: Completed 09/12/2019. Repeat every year Scheduled for 09/25/2021  Bone Density status: Completed 07/23/2020. Results reflect: Bone density results: OSTEOPOROSIS. Repeat every 2-3 years.  Lung Cancer Screening: (Low Dose CT Chest recommended if Age 72-80years, 30 pack-year currently smoking OR have quit w/in 15years.) does qualify.   Lung Cancer Screening Referral: no  Additional Screening:  Hepatitis C Screening: does qualify; Completed no  Vision Screening: Recommended annual ophthalmology exams for early detection of glaucoma and other disorders of the eye. Is the patient up to date with their annual eye exam?  Yes  Who is the provider or what is the name of the office in which the patient attends annual eye exams? JSherlynn Stalls MD & TDarleen Crocker MD. If pt is not established with a provider, would they like to be referred to a provider to establish care? No .   Dental Screening: Recommended annual dental exams for proper oral hygiene  Community Resource Referral / Chronic Care Management: CRR required this visit?  No   CCM required this visit?  No      Plan:     I have personally reviewed and noted the following in the patients chart:   Medical and social history Use of alcohol, tobacco or illicit drugs  Current medications and supplements including opioid prescriptions.  Functional ability and status Nutritional status Physical activity Advanced directives List of other physicians Hospitalizations, surgeries, and ER visits in previous 12 months Vitals Screenings to include cognitive, depression, and falls Referrals and appointments  In addition, I have reviewed and discussed with patient certain preventive protocols, quality metrics, and best practice recommendations. A written personalized care plan for preventive services as well as general preventive health  recommendations were provided to patient.     Sheral Flow, LPN   02/11/8562   Nurse Notes:  Hearing Screening - Comments:: Patient denied any hearing difficulty.   No hearing aids.  Vision Screening - Comments:: Patient does not wear any corrective lenses/contacts.  Had cataracts removed and macular degeneration  Eye exam done by: Darleen Crocker, MD and Sherlynn Stalls, MD.

## 2021-09-24 DIAGNOSIS — R42 Dizziness and giddiness: Secondary | ICD-10-CM | POA: Diagnosis not present

## 2021-09-24 DIAGNOSIS — I739 Peripheral vascular disease, unspecified: Secondary | ICD-10-CM | POA: Diagnosis not present

## 2021-09-24 DIAGNOSIS — R69 Illness, unspecified: Secondary | ICD-10-CM | POA: Diagnosis not present

## 2021-09-24 DIAGNOSIS — I10 Essential (primary) hypertension: Secondary | ICD-10-CM | POA: Diagnosis not present

## 2021-09-24 DIAGNOSIS — Z008 Encounter for other general examination: Secondary | ICD-10-CM | POA: Diagnosis not present

## 2021-09-24 DIAGNOSIS — Z809 Family history of malignant neoplasm, unspecified: Secondary | ICD-10-CM | POA: Diagnosis not present

## 2021-09-24 DIAGNOSIS — K219 Gastro-esophageal reflux disease without esophagitis: Secondary | ICD-10-CM | POA: Diagnosis not present

## 2021-09-24 DIAGNOSIS — R32 Unspecified urinary incontinence: Secondary | ICD-10-CM | POA: Diagnosis not present

## 2021-09-24 DIAGNOSIS — R636 Underweight: Secondary | ICD-10-CM | POA: Diagnosis not present

## 2021-09-24 DIAGNOSIS — R0602 Shortness of breath: Secondary | ICD-10-CM | POA: Diagnosis not present

## 2021-09-24 DIAGNOSIS — Z823 Family history of stroke: Secondary | ICD-10-CM | POA: Diagnosis not present

## 2021-09-24 DIAGNOSIS — Z681 Body mass index (BMI) 19 or less, adult: Secondary | ICD-10-CM | POA: Diagnosis not present

## 2021-09-25 ENCOUNTER — Ambulatory Visit
Admission: RE | Admit: 2021-09-25 | Discharge: 2021-09-25 | Disposition: A | Discharge status: Home / Self Care | Payer: Medicare HMO | Source: Ambulatory Visit | Attending: Obstetrics and Gynecology | Admitting: Obstetrics and Gynecology

## 2021-09-25 DIAGNOSIS — Z09 Encounter for follow-up examination after completed treatment for conditions other than malignant neoplasm: Secondary | ICD-10-CM

## 2021-09-25 DIAGNOSIS — R922 Inconclusive mammogram: Secondary | ICD-10-CM | POA: Diagnosis not present

## 2021-10-02 ENCOUNTER — Encounter: Payer: Self-pay | Admitting: Internal Medicine

## 2021-10-15 ENCOUNTER — Encounter: Payer: Self-pay | Admitting: Internal Medicine

## 2021-10-15 DIAGNOSIS — I739 Peripheral vascular disease, unspecified: Secondary | ICD-10-CM

## 2021-10-16 MED ORDER — ASPIRIN 81 MG PO TBEC: 81.0000 mg | DELAYED_RELEASE_TABLET | Freq: Every day | ORAL | 12 refills | Status: AC

## 2021-10-16 NOTE — Addendum Note (Signed)
Addended by: Binnie Rail on: 10/16/2021 08:42 PM ? ? Modules accepted: Orders ? ?

## 2021-11-01 ENCOUNTER — Telehealth: Payer: Self-pay

## 2021-11-01 NOTE — Progress Notes (Signed)
? ? ?  Chronic Care Management ?Pharmacy Assistant  ? ?Name: Jennifer Stephenson  MRN: [id number] DOB: 1951/06/04 ? ?Reason for Encounter: Disease State-General ?  ? ?Recent office visits:  ?08/08/21 [name], NP ([place]) No orders; Med changes:clotrimazole-betamethasone (LOTRISONE) cream ? ?Recent consult visits:  ?None since the last coordination call ? ?Hospital visits:  ?None in previous 6 months ? ?Medications: ?Outpatient Encounter Medications as of 11/01/2021  ?Medication Sig  ? albuterol (VENTOLIN HFA) 108 (90 Base) MCG/ACT inhaler INHALE 2 PUFFS EVERY 6 (SIX) HOURS AS NEEDED INTO THE LUNGS FOR WHEEZING OR SHORTNESS OF BREATH.  ? aspirin 81 MG EC tablet Take 1 tablet (81 mg total) by mouth daily. Swallow whole.  ? Calcium-Vitamin D 600-125 MG-UNIT TABS Take by mouth daily. Take 2  ? clotrimazole-betamethasone (LOTRISONE) cream Apply 1 application topically daily.  ? famotidine (PEPCID) 20 MG tablet TAKE 1 TABLET BY MOUTH TWICE A DAY  ? gabapentin (NEURONTIN) 100 MG capsule Take 1 capsule (100 mg total) by mouth at bedtime.  ? GARLIC OIL PO Take 1 tablet by mouth daily.  ? ketorolac (ACULAR) 0.5 % ophthalmic solution Place 1 drop into the left eye 4 (four) times daily.  ? LORazepam (ATIVAN) 1 MG tablet TAKE 0.5 TABLETS BY MOUTH EVERY 8 HOURS AS NEEDED FOR ANXIETY.  ? meclizine (ANTIVERT) 12.5 MG tablet Take 1-2 tablets (12.5-25 mg total) by mouth 3 (three) times daily as needed for dizziness.  ? moxifloxacin (VIGAMOX) 0.5 % ophthalmic solution Place 1 drop into the left eye 4 (four) times daily.  ? prednisoLONE acetate (PRED FORTE) 1 % ophthalmic suspension Place 1 drop into the left eye 4 (four) times daily.  ? TIADYLT ER 180 MG 24 hr capsule TAKE 1 CAPSULE BY MOUTH EVERY DAY  ? vitamin E 180 MG (400 UNITS) capsule Take 400 Units by mouth daily.  ? ?No facility-administered encounter medications on file as of 11/01/2021.  ? ?Have you had any problems recently with your health?Home health nurse came  out 09/16/21   and thought she had blockages in legs. Jennifer Stephenson made a referral to vascular surgeon but patient states that she has not received a call yet for an appointment. Patient states that her legs are not bothering her right now or even then but she is eager to know if she has PAD. ? ?Have you had any problems with your pharmacy?Patient states that she does not have any problems with getting medications from the pharmacy or the cost of medications ? ?What issues or side effects are you having with your medications?Patient states that she does not have any side effects to medications ? ?What would you like me to pass along to [facility] for them to help you with? Patient states that she is not taking any of the eye drop medications that are on her list or the gabapentin ? ?What can we do to take care of you better? Patient states that she is going to call Dr. Quay Burow office to find out when appointment is for vascular surgeon today, and there is nothing she needs right now ? ?Care Gaps: ?Colonoscopy-10/31/11 ?Diabetic Foot Exam-NA ?Mammogram-09/25/21 ?Ophthalmology-NA ?Dexa Scan - 07/23/20 ?Annual Well Visit - 06/21/21 ?Micro albumin-NA ?Hemoglobin A1c-NA ?  ?Star Rating Drugs: ?None ID ?  ?Ethelene Hal ?Clinical Pharmacist Assistant ?[phone]  ? ?

## 2021-11-07 ENCOUNTER — Ambulatory Visit (AMBULATORY_SURGERY_CENTER): Payer: Medicare HMO

## 2021-11-07 VITALS — Ht 61.5 in | Wt 88.0 lb

## 2021-11-07 DIAGNOSIS — Z1211 Encounter for screening for malignant neoplasm of colon: Secondary | ICD-10-CM

## 2021-11-07 NOTE — Progress Notes (Signed)
No egg or soy allergy known to patient  ? ?No issues known to pt with past sedation with any surgeries or procedures other than hx of reactive airway disease ? ? ?Patient denies ever being told they had issues or difficulty with intubation  ?No FH of Malignant Hyperthermia ?Pt is not on diet pills ?Pt is not on home 02  ?Pt is not on blood thinners  ?Pt denies issues with constipation at this time; ?No A fib or A flutter ? ?Discussed with pt there will be an out-of-pocket cost for prep and that varies from $0 to 70 + dollars - pt verbalized understanding  ? ?PV completed over the phone. Pt verified name, DOB, address and insurance during PV today.  ?Pt mailed instruction packet with copy of consent form to read and not return, and instructions.  ?Pt encouraged to call with questions or issues.  ?If pt has My chart, procedure instructions sent via My Chart  ?Insurance confirmed with pt at [facility] today  ? ?

## 2021-11-08 ENCOUNTER — Other Ambulatory Visit: Payer: Self-pay | Admitting: Internal Medicine

## 2021-11-22 ENCOUNTER — Encounter: Payer: Self-pay | Admitting: Internal Medicine

## 2021-11-27 ENCOUNTER — Other Ambulatory Visit: Payer: Self-pay | Admitting: *Deleted

## 2021-11-27 DIAGNOSIS — R6889 Other general symptoms and signs: Secondary | ICD-10-CM

## 2021-11-28 ENCOUNTER — Encounter: Payer: Medicare HMO | Admitting: Internal Medicine

## 2021-11-28 ENCOUNTER — Encounter: Payer: Self-pay | Admitting: Internal Medicine

## 2021-11-28 ENCOUNTER — Ambulatory Visit (AMBULATORY_SURGERY_CENTER): Payer: Medicare HMO | Admitting: Internal Medicine

## 2021-11-28 VITALS — BP 146/67 | HR 70 | Temp 97.5°F | Resp 11 | Ht 61.5 in | Wt 88.0 lb

## 2021-11-28 DIAGNOSIS — J439 Emphysema, unspecified: Secondary | ICD-10-CM | POA: Diagnosis not present

## 2021-11-28 DIAGNOSIS — R69 Illness, unspecified: Secondary | ICD-10-CM | POA: Diagnosis not present

## 2021-11-28 DIAGNOSIS — Z86010 Personal history of colonic polyps: Secondary | ICD-10-CM | POA: Diagnosis not present

## 2021-11-28 DIAGNOSIS — I10 Essential (primary) hypertension: Secondary | ICD-10-CM | POA: Diagnosis not present

## 2021-11-28 MED ORDER — SODIUM CHLORIDE 0.9 % IV SOLN: 500.0000 mL | Freq: Once | INTRAVENOUS | Status: DC

## 2021-11-28 NOTE — Patient Instructions (Addendum)
No polyps or cancer seen.  You still have diverticulosis - thickened muscle rings and pouches in the colon wall. Please read the handout about this condition.  I appreciate the opportunity to care for you. Gatha Mayer, MD, FACG   YOU HAD AN ENDOSCOPIC PROCEDURE TODAY AT [facility]:   Refer to the procedure report that was given to you for any specific questions about what was found during the examination.  If the procedure report does not answer your questions, please call your gastroenterologist to clarify.  If you requested that your care partner not be given the details of your procedure findings, then the procedure report has been included in a sealed envelope for you to review at your convenience later.  YOU SHOULD EXPECT: Some feelings of bloating in the abdomen. Passage of more gas than usual.  Walking can help get rid of the air that was put into your GI tract during the procedure and reduce the bloating. If you had a lower endoscopy (such as a colonoscopy or flexible sigmoidoscopy) you may notice spotting of blood in your stool or on the toilet paper. If you underwent a bowel prep for your procedure, you may not have a normal bowel movement for a few days.  Please Note:  You might notice some irritation and congestion in your nose or some drainage.  This is from the oxygen used during your procedure.  There is no need for concern and it should clear up in a day or so.  SYMPTOMS TO REPORT IMMEDIATELY:  Following lower endoscopy (colonoscopy or flexible sigmoidoscopy):  Excessive amounts of blood in the stool  Significant tenderness or worsening of abdominal pains  Swelling of the abdomen that is new, acute  Fever of 100F or higher  For urgent or emergent issues, a gastroenterologist can be reached at any hour by calling [phone]. Do not use MyChart messaging for urgent concerns.    DIET:  We do recommend a small meal at first, but then you may proceed to  your regular diet.  Drink plenty of fluids but you should avoid alcoholic beverages for 24 hours.  ACTIVITY:  You should plan to take it easy for the rest of today and you should NOT DRIVE or use heavy machinery until tomorrow (because of the sedation medicines used during the test).    FOLLOW UP: Our staff will call the number listed on your records 48-72 hours following your procedure to check on you and address any questions or concerns that you may have regarding the information given to you following your procedure. If we do not reach you, we will leave a message.  We will attempt to reach you two times.  During this call, we will ask if you have developed any symptoms of COVID 19. If you develop any symptoms (ie: fever, flu-like symptoms, shortness of breath, cough etc.) before then, please call [phone].  If you test positive for Covid 19 in the 2 weeks post procedure, please call and report this information to [place].    If any biopsies were taken you will be contacted by phone or by letter within the next 1-3 weeks.  Please call us at [phone] if you have not heard about the biopsies in 3 weeks.    SIGNATURES/CONFIDENTIALITY: You and/or your care partner have signed paperwork which will be entered into your electronic medical record.  These signatures attest to the fact that that the information above on your After Visit  Summary has been reviewed and is understood.  Full responsibility of the confidentiality of this discharge information lies with you and/or your care-partner.  

## 2021-11-28 NOTE — Progress Notes (Signed)
To pacu, VSS. Report to [email or web address] 

## 2021-11-28 NOTE — Progress Notes (Signed)
Pt's states no medical or surgical changes since previsit or office visit. 

## 2021-11-28 NOTE — Progress Notes (Signed)
[facility] History and Physical   Primary Care Physician:  Binnie Rail, MD   Reason for Procedure:  Hx colon polyp  Plan:    colonoscopy     HPI: Jennifer Stephenson is a 71 y.o. female here for polyp surveillance - remote adenoma removal; 2007 no polyps 2013   Past Medical History:  Diagnosis Date   Adenomatous colon polyp 2007 & 2010   Anemia    PMH of    Anxiety    on meds   Cataract    bilateral   DDD (degenerative disc disease), cervical    Dysphagia    Emphysema of lung (HCC)    beginning stage   Endometriosis    Fibrocystic breast disease    Fracture of wrist, closed    bilaterally age 68 (  fell skating )   GERD (gastroesophageal reflux disease)    on meds   HTN (hypertension)    on meds   Hyperlipidemia    on meds   Interstitial cystitis    Osteopenia    [place] DEXA   Seasonal allergies    on meds   Syncope 07/14/2005   seen in ER ; Dx: dehydration    Past Surgical History:  Procedure Laterality Date   ABDOMINAL HYSTERECTOMY  07/14/1994   & BSO FOR ENDOMETRIOSIS   CATARACT EXTRACTION, BILATERAL     CERVICAL FUSION     X3:.2001 C4-6, 2007 C6-7,2012C3-4 (?)   COLONOSCOPY  07/15/2011   CG-F/V-movi(good)-normal   COLONOSCOPY W/ POLYPECTOMY  2007 & 2011   ADENOMATOUS polyp, internal hemorrhoids   EGD WITH ESOPHAGEAL DILATION  05/15/2009,07/03/11   ESOPHAGEAL MANOMETRY  10/27/2011   Procedure: ESOPHAGEAL MANOMETRY (EM);  Surgeon: Gatha Mayer, MD;  Location: [facility];  Service: Endoscopy;  Laterality: N/A;   SEPTOPLASTY     TUBAL LIGATION     WISDOM TOOTH EXTRACTION      Prior to Admission medications   Medication Sig Start Date End Date Taking? Authorizing Provider  aspirin 81 MG EC tablet Take 1 tablet (81 mg total) by mouth daily. Swallow whole. 10/16/21  Yes Binnie Rail, MD  Calcium-Vitamin D [phone] MG-UNIT TABS Take by mouth daily. Take 2   Yes [provider]  famotidine (PEPCID) 20 MG tablet TAKE 1 TABLET  BY MOUTH TWICE A DAY 11/08/21  Yes Burns, Claudina Lick, MD  GARLIC OIL PO Take 1 tablet by mouth daily.   Yes [provider]  LORazepam (ATIVAN) 1 MG tablet TAKE 0.5 TABLETS BY MOUTH EVERY 8 HOURS AS NEEDED FOR ANXIETY. 05/07/21  Yes Burns, Claudina Lick, MD  TIADYLT ER 180 MG 24 hr capsule TAKE 1 CAPSULE BY MOUTH EVERY DAY 05/28/21  Yes Burns, Claudina Lick, MD  vitamin E 180 MG (400 UNITS) capsule Take 400 Units by mouth daily.   Yes [provider]  albuterol (VENTOLIN HFA) 108 (90 Base) MCG/ACT inhaler INHALE 2 PUFFS EVERY 6 (SIX) HOURS AS NEEDED INTO THE LUNGS FOR WHEEZING OR SHORTNESS OF BREATH. 06/21/21   Binnie Rail, MD  estradiol (ESTRACE) 0.1 MG/GM vaginal cream SMARTSIG:1 Gram(s) Vaginal Every Other Day 09/10/21   [provider]  meclizine (ANTIVERT) 12.5 MG tablet Take 1-2 tablets (12.5-25 mg total) by mouth 3 (three) times daily as needed for dizziness. 06/18/20   Binnie Rail, MD    Current Outpatient Medications  Medication Sig Dispense Refill   aspirin 81 MG EC tablet Take 1 tablet (81 mg total) by mouth daily.  Swallow whole. 30 tablet 12   Calcium-Vitamin D 600-125 MG-UNIT TABS Take by mouth daily. Take 2     famotidine (PEPCID) 20 MG tablet TAKE 1 TABLET BY MOUTH TWICE A DAY 676 tablet 1   GARLIC OIL PO Take 1 tablet by mouth daily.     LORazepam (ATIVAN) 1 MG tablet TAKE 0.5 TABLETS BY MOUTH EVERY 8 HOURS AS NEEDED FOR ANXIETY. 30 tablet 2   TIADYLT ER 180 MG 24 hr capsule TAKE 1 CAPSULE BY MOUTH EVERY DAY 90 capsule 3   vitamin E 180 MG (400 UNITS) capsule Take 400 Units by mouth daily.     albuterol (VENTOLIN HFA) 108 (90 Base) MCG/ACT inhaler INHALE 2 PUFFS EVERY 6 (SIX) HOURS AS NEEDED INTO THE LUNGS FOR WHEEZING OR SHORTNESS OF BREATH. 36 g 8   estradiol (ESTRACE) 0.1 MG/GM vaginal cream SMARTSIG:1 Gram(s) Vaginal Every Other Day     meclizine (ANTIVERT) 12.5 MG tablet Take 1-2 tablets (12.5-25 mg total) by mouth 3 (three) times daily as needed for dizziness.  30 tablet 3   Current Facility-Administered Medications  Medication Dose Route Frequency Provider Last Rate Last Admin   0.9 %  sodium chloride infusion  500 mL Intravenous Once Gatha Mayer, MD        Allergies as of 11/28/2021 - Review Complete 11/28/2021  Allergen Reaction Noted   Bupropion hcl  05/27/2007   Paroxetine  05/27/2007   Pentazocine lactate  05/27/2007   Ranitidine Hives 05/19/2016   Lipitor [atorvastatin]  09/05/2013   Pravastatin  06/06/2013   Prednisone  05/27/2007   Sulfonamide derivatives  05/27/2007    Family History  Problem Relation Age of Onset   Dementia Mother    Ulcers Sister    Stroke Sister         X 2; both > 53   Esophageal cancer Sister 17   Ulcers Brother    Heart attack Brother         in 46s   Cancer Brother        PANCREATIC    Diabetes Brother        X 2   Stroke Brother        X2 , both > 1   Diabetes Paternal Uncle    Cancer Maternal Grandmother        CNS   Colon polyps Neg Hx    Colon cancer Neg Hx    Stomach cancer Neg Hx    Rectal cancer Neg Hx     Social History   Socioeconomic History   Marital status: Married    Spouse name: Not on file   Number of children: 2   Years of education: Not on file   Highest education level: Not on file  Occupational History   Occupation: DISABLED  Tobacco Use   Smoking status: Every Day    Packs/day: 1.00    Types: Cigarettes   Smokeless tobacco: Never   Tobacco comments:    smoked 1967- present, up to 1 ppd; now 1/2-3/4 ppd  Vaping Use   Vaping Use: Never used  Substance and Sexual Activity   Alcohol use: No   Drug use: No   Sexual activity: Yes  Other Topics Concern   Not on file  Social History Narrative    CAFFEINE 3 CUPS DAILY   Social Determinants of Health   Financial Resource Strain: Low Risk    Difficulty of Paying Living Expenses: Not hard at all  Food Insecurity:  No Food Insecurity   Worried About Charity fundraiser in the Last Year: Never true    Ran Out of Food in the Last Year: Never true  Transportation Needs: No Transportation Needs   Lack of Transportation (Medical): No   Lack of Transportation (Non-Medical): No  Physical Activity: Sufficiently Active   Days of Exercise per Week: 5 days   Minutes of Exercise per Session: 30 min  Stress: Stress Concern Present   Feeling of Stress : To some extent  Social Connections: Engineer, building services of Communication with Friends and Family: More than three times a week   Frequency of Social Gatherings with Friends and Family: Once a week   Attends Religious Services: More than 4 times per year   Active Member of [employer] or Organizations: No   Attends Music therapist: More than 4 times per year   Marital Status: Married  Human resources officer Violence: Not At Risk   Fear of Current or Ex-Partner: No   Emotionally Abused: No   Physically Abused: No   Sexually Abused: No    Review of Systems:  All other review of systems negative except as mentioned in the HPI.  Physical Exam: Vital signs BP 139/64   Pulse 73   Temp (!) 97.5 F (36.4 C) (Temporal)   Ht 5' 1.5" (1.562 m)   Wt 88 lb (39.9 kg)   SpO2 97%   BMI 16.36 kg/m   General:   Alert,  Well-developed, well-nourished, pleasant and cooperative in NAD Lungs:  Clear throughout to auscultation.   Heart:  Regular rate and rhythm; no murmurs, clicks, rubs,  or gallops. Abdomen:  Soft, nontender and nondistended. Normal bowel sounds.   Neuro/Psych:  Alert and cooperative. Normal mood and affect. A and O x 3   '@Armin Yerger'$  Simonne Maffucci, MD, [facility] [phone] (pager) 11/28/2021 8:11 AM@

## 2021-11-28 NOTE — Op Note (Signed)
[facility] Patient Name: Jennifer Stephenson Procedure Date: 11/28/2021 7:30 AM MRN: [id number] Endoscopist: Gatha Mayer , MD Age: 71 Referring MD:  Date of Birth: Jul 10, 1951 Gender: Female Account #: [id number] Procedure:                Colonoscopy Indications:              Surveillance: Personal history of adenomatous                            polyps on last colonoscopy > 5 years ago, Last                            colonoscopy: 2013 Medicines:                Monitored Anesthesia Care Procedure:                Pre-Anesthesia Assessment:                           - Prior to the procedure, a History and Physical                            was performed, and patient medications and                            allergies were reviewed. The patient's tolerance of                            previous anesthesia was also reviewed. The risks                            and benefits of the procedure and the sedation                            options and risks were discussed with the patient.                            All questions were answered, and informed consent                            was obtained. Prior Anticoagulants: The patient has                            taken no previous anticoagulant or antiplatelet                            agents. ASA Grade Assessment: III - A patient with                            severe systemic disease. After reviewing the risks                            and benefits, the patient was deemed in  satisfactory condition to undergo the procedure.                           After obtaining informed consent, the colonoscope                            was passed under direct vision. Throughout the                            procedure, the patient's blood pressure, pulse, and                            oxygen saturations were monitored continuously. The                            PCF-HQ190L Colonoscope was introduced  through the                            anus and advanced to the the cecum, identified by                            appendiceal orifice and ileocecal valve. The                            colonoscopy was somewhat difficult due to a                            tortuous colon. Successful completion of the                            procedure was aided by straightening and shortening                            the scope to obtain bowel loop reduction. The                            patient tolerated the procedure well. The quality                            of the bowel preparation was good. The ileocecal                            valve, appendiceal orifice, and rectum were                            photographed. The bowel preparation used was                            Miralax via split dose instruction. Scope In: 8:18:14 AM Scope Out: 8:37:01 AM Scope Withdrawal Time: 0 hours 13 minutes 9 seconds  Total Procedure Duration: 0 hours 18 minutes 47 seconds  Findings:                 The perianal and digital rectal examinations were  normal.                           Many small and large-mouthed diverticula were found                            in the sigmoid colon. There was narrowing of the                            colon in association with the diverticular opening.                           The exam was otherwise without abnormality on                            direct and retroflexion views. Complications:            No immediate complications. Estimated Blood Loss:     Estimated blood loss: none. Impression:               - Severe diverticulosis in the sigmoid colon. There                            was narrowing of the colon in association with the                            diverticular opening.                           - The examination was otherwise normal on direct                            and retroflexion views.                           - No  specimens collected. Recommendation:           - Patient has a contact number available for                            emergencies. The signs and symptoms of potential                            delayed complications were discussed with the                            patient. Return to normal activities tomorrow.                            Written discharge instructions were provided to the                            patient.                           - Resume previous diet.                           -  Continue present medications.                           - No repeat colonoscopy due to current age (59                            years or older) and the absence of colonic polyps. Gatha Mayer, MD 11/28/2021 8:46:34 AM This report has been signed electronically.

## 2021-11-29 ENCOUNTER — Telehealth: Payer: Self-pay

## 2021-11-29 NOTE — Telephone Encounter (Signed)
  Follow up Call-     11/28/2021    7:36 AM  Call back number  Post procedure Call Back phone  # [phone]  Permission to leave phone message Yes     Patient questions:  Do you have a fever, pain , or abdominal swelling? No. Pain Score  0 *  Have you tolerated food without any problems? Yes.    Have you been able to return to your normal activities? Yes.    Do you have any questions about your discharge instructions: Diet   No. Medications  No. Follow up visit  No.  Do you have questions or concerns about your Care? No.  Actions: * If pain score is 4 or above: No action needed, pain <4.

## 2021-12-05 NOTE — Progress Notes (Signed)
Office Note     CC:  PAD Requesting Provider:  Binnie Rail, MD  HPI: RITI ROLLYSON is a 71 y.o. (16-May-1951) female presenting at the request of .Binnie Rail, MD recent home test demonstrated concern for peripheral arterial disease.  On exam today, Deziya was doing well, accompanied by her husband.  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  Braya is a 0.75 pack/day smoker, and is working to wean down.  She denies history of claudication, ischemic rest pain, tissue loss.  Has no history of TIA, stroke, amaurosis.  No history of postprandial abdominal pain.  The pt is not on a statin for cholesterol management.  The pt is  on a daily aspirin.   Other AC:  - The pt is not on medication for hypertension.   The pt is not diabetic.  Tobacco hx:  current  Past Medical History:  Diagnosis Date   Adenomatous colon polyp 2007 & 2010   Anemia    PMH of    Anxiety    on meds   Cataract    bilateral   DDD (degenerative disc disease), cervical    Dysphagia    Emphysema of lung (HCC)    beginning stage   Endometriosis    Fibrocystic breast disease    Fracture of wrist, closed    bilaterally age 89 (  fell skating )   GERD (gastroesophageal reflux disease)    on meds   HTN (hypertension)    on meds   Hyperlipidemia    on meds   Interstitial cystitis    Osteopenia    [place] DEXA   Seasonal allergies    on meds   Syncope 07/14/2005   seen in ER ; Dx: dehydration    Past Surgical History:  Procedure Laterality Date   ABDOMINAL HYSTERECTOMY  07/14/1994   & BSO FOR ENDOMETRIOSIS   CATARACT EXTRACTION, BILATERAL     CERVICAL FUSION     X3:.2001 C4-6, 2007 C6-7,2012C3-4 (?)   COLONOSCOPY  07/15/2011   CG-F/V-movi(good)-normal   COLONOSCOPY W/ POLYPECTOMY  2007 & 2011   ADENOMATOUS polyp, internal hemorrhoids   EGD WITH ESOPHAGEAL DILATION  05/15/2009,07/03/11   ESOPHAGEAL MANOMETRY  10/27/2011   Procedure: ESOPHAGEAL  MANOMETRY (EM);  Surgeon: Gatha Mayer, MD;  Location: [facility];  Service: Endoscopy;  Laterality: N/A;   SEPTOPLASTY     TUBAL LIGATION     WISDOM TOOTH EXTRACTION      Social History   Socioeconomic History   Marital status: Married    Spouse name: Not on file   Number of children: 2   Years of education: Not on file   Highest education level: Not on file  Occupational History   Occupation: DISABLED  Tobacco Use   Smoking status: Every Day    Packs/day: 1.00    Types: Cigarettes   Smokeless tobacco: Never   Tobacco comments:    smoked 1967- present, up to 1 ppd; now 1/2-3/4 ppd  Vaping Use   Vaping Use: Never used  Substance and Sexual Activity   Alcohol use: No   Drug use: No   Sexual activity: Yes  Other Topics Concern   Not on file  Social History Narrative    CAFFEINE 3 CUPS DAILY   Social Determinants of Health   Financial Resource Strain: Low Risk    Difficulty of Paying Living Expenses: Not hard at all  Food Insecurity:  No Food Insecurity   Worried About Charity fundraiser in the Last Year: Never true   Ran Out of Food in the Last Year: Never true  Transportation Needs: No Transportation Needs   Lack of Transportation (Medical): No   Lack of Transportation (Non-Medical): No  Physical Activity: Sufficiently Active   Days of Exercise per Week: 5 days   Minutes of Exercise per Session: 30 min  Stress: Stress Concern Present   Feeling of Stress : To some extent  Social Connections: Engineer, building services of Communication with Friends and Family: More than three times a week   Frequency of Social Gatherings with Friends and Family: Once a week   Attends Religious Services: More than 4 times per year   Active Member of [employer] or Organizations: No   Attends Music therapist: More than 4 times per year   Marital Status: Married  Human resources officer Violence: Not At Risk   Fear of Current or Ex-Partner: No   Emotionally Abused: No    Physically Abused: No   Sexually Abused: No   Family History  Problem Relation Age of Onset   Dementia Mother    Ulcers Sister    Stroke Sister         X 2; both > 24   Esophageal cancer Sister 84   Ulcers Brother    Heart attack Brother         in 38s   Cancer Brother        PANCREATIC    Diabetes Brother        X 2   Stroke Brother        X2 , both > 55   Diabetes Paternal Uncle    Cancer Maternal Grandmother        CNS   Colon polyps Neg Hx    Colon cancer Neg Hx    Stomach cancer Neg Hx    Rectal cancer Neg Hx     Current Outpatient Medications  Medication Sig Dispense Refill   albuterol (VENTOLIN HFA) 108 (90 Base) MCG/ACT inhaler INHALE 2 PUFFS EVERY 6 (SIX) HOURS AS NEEDED INTO THE LUNGS FOR WHEEZING OR SHORTNESS OF BREATH. 36 g 8   aspirin 81 MG EC tablet Take 1 tablet (81 mg total) by mouth daily. Swallow whole. 30 tablet 12   Calcium-Vitamin D 600-125 MG-UNIT TABS Take by mouth daily. Take 2     estradiol (ESTRACE) 0.1 MG/GM vaginal cream SMARTSIG:1 Gram(s) Vaginal Every Other Day     famotidine (PEPCID) 20 MG tablet TAKE 1 TABLET BY MOUTH TWICE A DAY 465 tablet 1   GARLIC OIL PO Take 1 tablet by mouth daily.     LORazepam (ATIVAN) 1 MG tablet TAKE 0.5 TABLETS BY MOUTH EVERY 8 HOURS AS NEEDED FOR ANXIETY. 30 tablet 2   meclizine (ANTIVERT) 12.5 MG tablet Take 1-2 tablets (12.5-25 mg total) by mouth 3 (three) times daily as needed for dizziness. 30 tablet 3   TIADYLT ER 180 MG 24 hr capsule TAKE 1 CAPSULE BY MOUTH EVERY DAY 90 capsule 3   vitamin E 180 MG (400 UNITS) capsule Take 400 Units by mouth daily.     No current facility-administered medications for this visit.    Allergies  Allergen Reactions   Bupropion Hcl     rash   Paroxetine     rash   Pentazocine Lactate     ([place])hospitalized due to mental status changes   Ranitidine Hives  Lipitor [Atorvastatin]     09/05/13 leg pain as per phone call   Pravastatin     Leg pain   Prednisone      Nausea & cramps   Sulfonamide Derivatives     nausea     REVIEW OF SYSTEMS:  '[X]'$  denotes positive finding, '[ ]'$  denotes negative finding Cardiac  Comments:  Chest pain or chest pressure:    Shortness of breath upon exertion:    Short of breath when lying flat:    Irregular heart rhythm:        Vascular    Pain in calf, thigh, or hip brought on by ambulation:    Pain in feet at night that wakes you up from your sleep:     Blood clot in your veins:    Leg swelling:         Pulmonary    Oxygen at home:    Productive cough:     Wheezing:         Neurologic    Sudden weakness in arms or legs:     Sudden numbness in arms or legs:     Sudden onset of difficulty speaking or slurred speech:    Temporary loss of vision in one eye:     Problems with dizziness:         Gastrointestinal    Blood in stool:     Vomited blood:         Genitourinary    Burning when urinating:     Blood in urine:        Psychiatric    Major depression:         Hematologic    Bleeding problems:    Problems with blood clotting too easily:        Skin    Rashes or ulcers:        Constitutional    Fever or chills:      PHYSICAL EXAMINATION:  There were no vitals filed for this visit.  General:  WDWN in NAD; vital signs documented above Gait: Not observed HENT: WNL, normocephalic Pulmonary: normal non-labored breathing , without wheezing Cardiac: regular HR, no bruits appreciated in the carotids Abdomen: soft, NT, no masses Skin: without rashes Vascular Exam/Pulses:  Right Left  Radial 2+ (normal) 2+ (normal)              DP 2+ 1+  PT 2+ (normal) 2+ (normal)   Extremities: without ischemic changes, without Gangrene , without cellulitis; without open wounds;  Musculoskeletal: no muscle wasting or atrophy  Neurologic: A&O X 3;  No focal weakness or paresthesias are detected Psychiatric:  The pt has Normal affect.   Non-Invasive Vascular Imaging:    +-------+-----------+-----------+------------+------------+  ABI/TBIToday's ABIToday's TBIPrevious ABIPrevious TBI  +-------+-----------+-----------+------------+------------+  Right  1.15       1.03                                 +-------+-----------+-----------+------------+------------+  Left   1.12       0.89                                 +-------+-----------+-----------+------------+------------+     ASSESSMENT/PLAN: SHAUNTIA LEVENGOOD is a 71 y.o. female presenting at the request of her PCP after recent in-home ABI demonstrated concern for peripheral arterial disease.  ABI performed in  clinic today was reviewed demonstrating normal waveforms bilaterally, excellent toe pressures.  Physical exam, Korrie had palpable pulses in the feet.  Had a long conversation with Tareva regarding the above.  Sanders not have symptomatic peripheral arterial disease.  She would benefit from smoking cessation as well as continuing to live an active lifestyle.  With her smoking history, she would benefit from high intensity statin, aspirin therapy as both of been found to reduce the risk of cerebrovascular and cardiovascular events over the next 5 years.  Being that ABIs are normal, Yanna can follow-up with me as needed.   Broadus John, MD [facility] [phone]

## 2021-12-06 ENCOUNTER — Ambulatory Visit (HOSPITAL_COMMUNITY)
Admission: RE | Admit: 2021-12-06 | Discharge: 2021-12-06 | Disposition: A | Discharge status: Home / Self Care | Payer: Medicare HMO | Source: Ambulatory Visit | Attending: Vascular Surgery | Admitting: Vascular Surgery

## 2021-12-06 ENCOUNTER — Encounter: Payer: Self-pay | Admitting: Vascular Surgery

## 2021-12-06 ENCOUNTER — Ambulatory Visit: Payer: Medicare HMO | Admitting: Vascular Surgery

## 2021-12-06 VITALS — BP 148/78 | HR 59 | Temp 97.9°F | Resp 20 | Ht 61.5 in | Wt 90.0 lb

## 2021-12-06 DIAGNOSIS — F17200 Nicotine dependence, unspecified, uncomplicated: Secondary | ICD-10-CM

## 2021-12-06 DIAGNOSIS — R6889 Other general symptoms and signs: Secondary | ICD-10-CM | POA: Diagnosis not present

## 2021-12-06 DIAGNOSIS — R69 Illness, unspecified: Secondary | ICD-10-CM | POA: Diagnosis not present

## 2021-12-17 ENCOUNTER — Other Ambulatory Visit: Payer: Self-pay | Admitting: Internal Medicine

## 2021-12-17 DIAGNOSIS — F411 Generalized anxiety disorder: Secondary | ICD-10-CM

## 2022-04-26 ENCOUNTER — Other Ambulatory Visit: Payer: Self-pay | Admitting: Internal Medicine

## 2022-04-26 DIAGNOSIS — F411 Generalized anxiety disorder: Secondary | ICD-10-CM

## 2022-05-01 ENCOUNTER — Other Ambulatory Visit: Payer: Self-pay | Admitting: Internal Medicine

## 2022-05-22 ENCOUNTER — Telehealth: Payer: Self-pay | Admitting: Internal Medicine

## 2022-05-22 ENCOUNTER — Other Ambulatory Visit: Payer: Self-pay | Admitting: Internal Medicine

## 2022-05-22 MED ORDER — MECLIZINE HCL 12.5 MG PO TABS: 12.5000 mg | ORAL_TABLET | Freq: Three times a day (TID) | ORAL | 5 refills | Status: AC | PRN

## 2022-05-22 NOTE — Telephone Encounter (Signed)
Pt called to ask for a new RX for [facility]. Pt says she experienced 2 episodes of vertigo in the last 3 weeks.  Last visit: 08/08/21  Next visit: CPE 06/26/22  Preferred pharmacy:  [id number] [place] [address], [id number]: [phone] Fax: [phone]  Call pt to advise if she needs to wait for next appt. Pt ph [phone]

## 2022-06-02 DIAGNOSIS — J1189 Influenza due to unidentified influenza virus with other manifestations: Secondary | ICD-10-CM | POA: Diagnosis not present

## 2022-06-25 NOTE — Patient Instructions (Addendum)
Blood work was ordered.   The lab is on the first floor.    Medications changes include :   None    A thyroid ultrasound was ordered.     Someone will call you to schedule an appointment.    Return in about 1 year (around 06/27/2023) for Physical Exam, Schedule DEXA-[place].    Health Maintenance, Female Adopting a healthy lifestyle and getting preventive care are important in promoting health and wellness. Ask your health care provider about: The right schedule for you to have regular tests and exams. Things you can do on your own to prevent diseases and keep yourself healthy. What should I know about diet, weight, and exercise? Eat a healthy diet  Eat a diet that includes plenty of vegetables, fruits, low-fat dairy products, and lean protein. Do not eat a lot of foods that are high in solid fats, added sugars, or sodium. Maintain a healthy weight Body mass index (BMI) is used to identify weight problems. It estimates body fat based on height and weight. Your health care provider can help determine your BMI and help you achieve or maintain a healthy weight. Get regular exercise Get regular exercise. This is one of the most important things you can do for your health. Most adults should: Exercise for at least 150 minutes each week. The exercise should increase your heart rate and make you sweat (moderate-intensity exercise). Do strengthening exercises at least twice a week. This is in addition to the moderate-intensity exercise. Spend less time sitting. Even light physical activity can be beneficial. Watch cholesterol and blood lipids Have your blood tested for lipids and cholesterol at 72 years of age, then have this test every 5 years. Have your cholesterol levels checked more often if: Your lipid or cholesterol levels are high. You are older than 72 years of age. You are at high risk for heart disease. What should I know about cancer screening? Depending on your health  history and family history, you may need to have cancer screening at various ages. This may include screening for: Breast cancer. Cervical cancer. Colorectal cancer. Skin cancer. Lung cancer. What should I know about heart disease, diabetes, and high blood pressure? Blood pressure and heart disease High blood pressure causes heart disease and increases the risk of stroke. This is more likely to develop in people who have high blood pressure readings or are overweight. Have your blood pressure checked: Every 3-5 years if you are 39-97 years of age. Every year if you are 2 years old or older. Diabetes Have regular diabetes screenings. This checks your fasting blood sugar level. Have the screening done: Once every three years after age 16 if you are at a normal weight and have a low risk for diabetes. More often and at a younger age if you are overweight or have a high risk for diabetes. What should I know about preventing infection? Hepatitis B If you have a higher risk for hepatitis B, you should be screened for this virus. Talk with your health care provider to find out if you are at risk for hepatitis B infection. Hepatitis C Testing is recommended for: Everyone born from [birth year] through 1966. Anyone with known risk factors for hepatitis C. Sexually transmitted infections (STIs) Get screened for STIs, including gonorrhea and chlamydia, if: You are sexually active and are younger than 72 years of age. You are older than 72 years of age and your health care provider tells you that you  are at risk for this type of infection. Your sexual activity has changed since you were last screened, and you are at increased risk for chlamydia or gonorrhea. Ask your health care provider if you are at risk. Ask your health care provider about whether you are at high risk for HIV. Your health care provider may recommend a prescription medicine to help prevent HIV infection. If you choose to take medicine to  prevent HIV, you should first get tested for HIV. You should then be tested every 3 months for as long as you are taking the medicine. Pregnancy If you are about to stop having your period (premenopausal) and you may become pregnant, seek counseling before you get pregnant. Take 400 to 800 micrograms (mcg) of folic acid every day if you become pregnant. Ask for birth control (contraception) if you want to prevent pregnancy. Osteoporosis and menopause Osteoporosis is a disease in which the bones lose minerals and strength with aging. This can result in bone fractures. If you are 33 years old or older, or if you are at risk for osteoporosis and fractures, ask your health care provider if you should: Be screened for bone loss. Take a calcium or vitamin D supplement to lower your risk of fractures. Be given hormone replacement therapy (HRT) to treat symptoms of menopause. Follow these instructions at home: Alcohol use Do not drink alcohol if: Your health care provider tells you not to drink. You are pregnant, may be pregnant, or are planning to become pregnant. If you drink alcohol: Limit how much you have to: 0-1 drink a day. Know how much alcohol is in your drink. In the [place] one drink equals one 12 oz bottle of beer (355 mL), one 5 oz glass of wine (148 mL), or one 1 oz glass of hard liquor (44 mL). Lifestyle Do not use any products that contain nicotine or tobacco. These products include cigarettes, chewing tobacco, and vaping devices, such as e-cigarettes. If you need help quitting, ask your health care provider. Do not use street drugs. Do not share needles. Ask your health care provider for help if you need support or information about quitting drugs. General instructions Schedule regular health, dental, and eye exams. Stay current with your vaccines. Tell your health care provider if: You often feel depressed. You have ever been abused or do not feel safe at  home. Summary Adopting a healthy lifestyle and getting preventive care are important in promoting health and wellness. Follow your health care provider's instructions about healthy diet, exercising, and getting tested or screened for diseases. Follow your health care provider's instructions on monitoring your cholesterol and blood pressure. This information is not intended to replace advice given to you by your health care provider. Make sure you discuss any questions you have with your health care provider. Document Revised: 11/19/2020 Document Reviewed: 11/19/2020 Elsevier Patient Education  [place].

## 2022-06-25 NOTE — Progress Notes (Unsigned)
Subjective:    Patient ID: Jennifer Stephenson, female    DOB: Nov 27, 1950, 71 y.o.   MRN: [id number]      HPI Jennifer Stephenson is here for a Physical exam.      Medications and allergies reviewed with patient and updated if appropriate.  Current Outpatient Medications on File Prior to Visit  Medication Sig Dispense Refill   albuterol (VENTOLIN HFA) 108 (90 Base) MCG/ACT inhaler INHALE 2 PUFFS EVERY 6 (SIX) HOURS AS NEEDED INTO THE LUNGS FOR WHEEZING OR SHORTNESS OF BREATH. 36 g 8   aspirin 81 MG EC tablet Take 1 tablet (81 mg total) by mouth daily. Swallow whole. 30 tablet 12   Calcium-Vitamin D 600-125 MG-UNIT TABS Take by mouth daily. Take 2     famotidine (PEPCID) 20 MG tablet TAKE 1 TABLET BY MOUTH TWICE A DAY 470 tablet 1   GARLIC OIL PO Take 1 tablet by mouth daily.     LORazepam (ATIVAN) 1 MG tablet TAKE 1/2 TABLETS BY MOUTH EVERY 8 HOURS AS NEEDED FOR ANXIETY 30 tablet 0   meclizine (ANTIVERT) 12.5 MG tablet Take 1-2 tablets (12.5-25 mg total) by mouth 3 (three) times daily as needed for dizziness. 30 tablet 5   TIADYLT ER 180 MG 24 hr capsule TAKE 1 CAPSULE BY MOUTH EVERY DAY 90 capsule 3   vitamin E 180 MG (400 UNITS) capsule Take 400 Units by mouth daily.     No current facility-administered medications on file prior to visit.    Review of Systems  Constitutional:  Negative for fever.  Eyes:  Negative for visual disturbance.  Respiratory:  Positive for cough (smokers cough) and shortness of breath (sometimes). Negative for wheezing.   Cardiovascular:  Negative for chest pain, palpitations and leg swelling.  Gastrointestinal:  Negative for abdominal pain, blood in stool, constipation, diarrhea and nausea.       No gerd  Genitourinary:  Negative for dysuria.  Musculoskeletal:  Positive for back pain (occ). Negative for arthralgias.  Skin:  Negative for rash.  Neurological:  Positive for headaches (posterior - since last neck surgery). Negative for light-headedness.   Psychiatric/Behavioral:  Negative for dysphoric mood. The patient is nervous/anxious.        Objective:   Vitals:   06/26/22 0751  BP: 136/74  Pulse: 80  Temp: 97.9 F (36.6 C)  SpO2: 97%   Filed Weights   06/26/22 0751  Weight: 89 lb 6.4 oz (40.6 kg)   Body mass index is 16.62 kg/m.  BP Readings from Last 3 Encounters:  06/26/22 136/74  12/06/21 (!) 148/78  11/28/21 (!) 146/67    Wt Readings from Last 3 Encounters:  06/26/22 89 lb 6.4 oz (40.6 kg)  12/06/21 90 lb (40.8 kg)  11/28/21 88 lb (39.9 kg)       Physical Exam Constitutional: She appears well-developed and well-nourished. No distress.  HENT:  Head: Normocephalic and atraumatic.  Right Ear: External ear normal. Normal ear canal and TM Left Ear: External ear normal.  Normal ear canal and TM Mouth/Throat: Oropharynx is clear and moist.  Eyes: Conjunctivae normal.  Neck: Neck supple. No tracheal deviation present. No thyromegaly present.  No carotid bruit  Cardiovascular: Normal rate, regular rhythm and normal heart sounds.   No murmur heard.  No edema. Pulmonary/Chest: Effort normal and breath sounds normal. No respiratory distress. She has no wheezes. She has no rales.  Breast: deferred   Abdominal: Soft. She exhibits no distension. There is no tenderness.  Lymphadenopathy: She has no cervical adenopathy.  Skin: Skin is warm and dry. She is not diaphoretic.  Psychiatric: She has a normal mood and affect. Her behavior is normal.     Lab Results  Component Value Date   WBC 6.8 06/21/2021   HGB 15.5 (H) 06/21/2021   HCT 45.3 06/21/2021   PLT 218.0 06/21/2021   GLUCOSE 85 06/21/2021   CHOL 164 06/21/2021   TRIG 54.0 06/21/2021   HDL 57.40 06/21/2021   LDLDIRECT 148.6 06/06/2013   LDLCALC 96 06/21/2021   ALT 13 06/21/2021   AST 19 06/21/2021   NA 137 06/21/2021   K 4.6 06/21/2021   CL 105 06/21/2021   CREATININE 0.78 06/21/2021   BUN 16 06/21/2021   CO2 25 06/21/2021   TSH 1.21  06/21/2021         Assessment & Plan:   Physical exam: Screening blood work  ordered Exercise  walking daily Weight  on light side Substance abuse  none   Reviewed recommended immunizations.   Health Maintenance  Topic Date Due   DEXA SCAN  07/23/2022   Medicare Annual Wellness (AWV)  09/17/2022   MAMMOGRAM  09/26/2023   DTaP/Tdap/Td (3 - Td or Tdap) 03/28/2029   Pneumonia Vaccine 43+ Years old  Completed   INFLUENZA VACCINE  Completed   Hepatitis C Screening  Completed   Zoster Vaccines- Shingrix  Completed   HPV VACCINES  Aged Out   COLONOSCOPY (Pts 45-51yr Insurance coverage will need to be confirmed)  Discontinued   COVID-19 Vaccine  Discontinued          See Problem List for Assessment and Plan of chronic medical problems.

## 2022-06-26 ENCOUNTER — Encounter: Payer: Self-pay | Admitting: Internal Medicine

## 2022-06-26 ENCOUNTER — Ambulatory Visit (INDEPENDENT_AMBULATORY_CARE_PROVIDER_SITE_OTHER): Payer: Medicare HMO | Admitting: Internal Medicine

## 2022-06-26 VITALS — BP 136/74 | HR 80 | Temp 97.9°F | Ht 61.5 in | Wt 89.4 lb

## 2022-06-26 DIAGNOSIS — I10 Essential (primary) hypertension: Secondary | ICD-10-CM | POA: Diagnosis not present

## 2022-06-26 DIAGNOSIS — F411 Generalized anxiety disorder: Secondary | ICD-10-CM | POA: Diagnosis not present

## 2022-06-26 DIAGNOSIS — R69 Illness, unspecified: Secondary | ICD-10-CM | POA: Diagnosis not present

## 2022-06-26 DIAGNOSIS — Z0000 Encounter for general adult medical examination without abnormal findings: Secondary | ICD-10-CM | POA: Diagnosis not present

## 2022-06-26 DIAGNOSIS — M810 Age-related osteoporosis without current pathological fracture: Secondary | ICD-10-CM

## 2022-06-26 DIAGNOSIS — J4520 Mild intermittent asthma, uncomplicated: Secondary | ICD-10-CM | POA: Diagnosis not present

## 2022-06-26 DIAGNOSIS — K219 Gastro-esophageal reflux disease without esophagitis: Secondary | ICD-10-CM

## 2022-06-26 DIAGNOSIS — E041 Nontoxic single thyroid nodule: Secondary | ICD-10-CM | POA: Diagnosis not present

## 2022-06-26 DIAGNOSIS — E7849 Other hyperlipidemia: Secondary | ICD-10-CM | POA: Diagnosis not present

## 2022-06-26 DIAGNOSIS — F17210 Nicotine dependence, cigarettes, uncomplicated: Secondary | ICD-10-CM

## 2022-06-26 LAB — COMPREHENSIVE METABOLIC PANEL
ALT: 13 U/L (ref 0–35)
AST: 20 U/L (ref 0–37)
Albumin: 4.6 g/dL (ref 3.5–5.2)
Alkaline Phosphatase: 71 U/L (ref 39–117)
BUN: 19 mg/dL (ref 6–23)
CO2: 26 mEq/L (ref 19–32)
Calcium: 9.7 mg/dL (ref 8.4–10.5)
Chloride: 105 mEq/L (ref 96–112)
Creatinine, Ser: 0.85 mg/dL (ref 0.40–1.20)
GFR: 68.49 mL/min (ref >60.00)
Glucose, Bld: 89 mg/dL (ref 70–99)
Potassium: 4.3 mEq/L (ref 3.5–5.1)
Sodium: 139 mEq/L (ref 135–145)
Total Bilirubin: 0.7 mg/dL (ref 0.2–1.2)
Total Protein: 7.6 g/dL (ref 6.0–8.3)

## 2022-06-26 LAB — CBC WITH DIFFERENTIAL/PLATELET
Basophils Absolute: 0.1 10*3/uL (ref 0.0–0.1)
Basophils Relative: 1.2 % (ref 0.0–3.0)
Eosinophils Absolute: 0.1 10*3/uL (ref 0.0–0.7)
Eosinophils Relative: 2 % (ref 0.0–5.0)
HCT: 45.6 % (ref 36.0–46.0)
Hemoglobin: 15.5 g/dL — ABNORMAL HIGH (ref 12.0–15.0)
Lymphocytes Relative: 20.8 % (ref 12.0–46.0)
Lymphs Abs: 1.4 10*3/uL (ref 0.7–4.0)
MCHC: 34.1 g/dL (ref 30.0–36.0)
MCV: 89.3 fl (ref 78.0–100.0)
Monocytes Absolute: 0.5 10*3/uL (ref 0.1–1.0)
Monocytes Relative: 7.8 % (ref 3.0–12.0)
Neutro Abs: 4.6 10*3/uL (ref 1.4–7.7)
Neutrophils Relative %: 68.2 % (ref 43.0–77.0)
Platelets: 234 10*3/uL (ref 150.0–400.0)
RBC: 5.11 Mil/uL (ref 3.87–5.11)
RDW: 14.1 % (ref 11.5–15.5)
WBC: 6.7 10*3/uL (ref 4.0–10.5)

## 2022-06-26 LAB — VITAMIN D 25 HYDROXY (VIT D DEFICIENCY, FRACTURES): VITD: 65.05 ng/mL (ref 30.00–100.00)

## 2022-06-26 LAB — LIPID PANEL
Cholesterol: 185 mg/dL (ref 0–200)
HDL: 56.6 mg/dL (ref >39.00)
LDL Cholesterol: 112 mg/dL — ABNORMAL HIGH (ref 0–99)
NonHDL: 128.26
Total CHOL/HDL Ratio: 3
Triglycerides: 82 mg/dL (ref 0.0–149.0)
VLDL: 16.4 mg/dL (ref 0.0–40.0)

## 2022-06-26 LAB — TSH: TSH: 1.39 u[IU]/mL (ref 0.35–5.50)

## 2022-06-26 MED ORDER — DILTIAZEM HCL ER BEADS 180 MG PO CP24
ORAL_CAPSULE | ORAL | 3 refills | Status: DC
Start: 2022-06-26 — End: 2023-07-30

## 2022-06-26 MED ORDER — ALBUTEROL SULFATE HFA 108 (90 BASE) MCG/ACT IN AERS: INHALATION_SPRAY | RESPIRATORY_TRACT | 8 refills | Status: DC

## 2022-06-26 MED ORDER — FAMOTIDINE 20 MG PO TABS: 20.0000 mg | ORAL_TABLET | Freq: Two times a day (BID) | ORAL | 3 refills | Status: DC

## 2022-06-26 MED ORDER — LORAZEPAM 1 MG PO TABS: ORAL_TABLET | ORAL | 2 refills | Status: DC

## 2022-06-26 NOTE — Assessment & Plan Note (Signed)
Chronic Regular exercise and healthy diet encouraged Check lipid panel  Continue lifestyle control-discussed starting a statin for preventative measures  The 10-year ASCVD risk score (Arnett DK, et al., 2019) is: 28.7%   Values used to calculate the score:     Age: 71 years     Sex: Female     Is Non-Hispanic African American: No     Diabetic: No     Tobacco smoker: Yes     Systolic Blood Pressure: 272 mmHg     Is BP treated: Yes     HDL Cholesterol: 57.4 mg/dL     Total Cholesterol: 164 mg/dL

## 2022-06-26 NOTE — Assessment & Plan Note (Signed)
Chronic Intermittent Continue albuterol as needed

## 2022-06-26 NOTE — Assessment & Plan Note (Signed)
Chronic BP well controlled Continue tiadylt ER 180 mg daily cmp

## 2022-06-26 NOTE — Assessment & Plan Note (Addendum)
Chronic DEXA due-ordered Continue regular exercise Continue calcium and vitamin D Check vitamin D level  Would consider reclast or prolia  ( if covered)- was on bisphosphonate for years - ? Help or not

## 2022-06-26 NOTE — Assessment & Plan Note (Signed)
Chronic Due for f/u US ordered tsh

## 2022-06-26 NOTE — Assessment & Plan Note (Signed)
Chronic GERD controlled Continue famotidine 20 mg bid

## 2022-06-26 NOTE — Assessment & Plan Note (Addendum)
Chronic Increased anxiety/stress - brother in law on hospice Continue lorazepam 0.5 mg every 8 hours as needed

## 2022-06-26 NOTE — Assessment & Plan Note (Signed)
Has tried several times to quit and will continue to try - she wants to quit Has increased stress now so it will likely be difficult to quit now

## 2022-07-10 DIAGNOSIS — R0781 Pleurodynia: Secondary | ICD-10-CM | POA: Diagnosis not present

## 2022-07-10 DIAGNOSIS — M940 Chondrocostal junction syndrome [Tietze]: Secondary | ICD-10-CM | POA: Diagnosis not present

## 2022-08-26 DIAGNOSIS — H43392 Other vitreous opacities, left eye: Secondary | ICD-10-CM | POA: Diagnosis not present

## 2022-08-26 DIAGNOSIS — H35341 Macular cyst, hole, or pseudohole, right eye: Secondary | ICD-10-CM | POA: Diagnosis not present

## 2022-08-26 DIAGNOSIS — H31091 Other chorioretinal scars, right eye: Secondary | ICD-10-CM | POA: Diagnosis not present

## 2022-08-26 DIAGNOSIS — H43822 Vitreomacular adhesion, left eye: Secondary | ICD-10-CM | POA: Diagnosis not present

## 2022-08-28 ENCOUNTER — Ambulatory Visit
Admission: RE | Admit: 2022-08-28 | Discharge: 2022-08-28 | Disposition: A | Discharge status: Home / Self Care | Payer: Medicare HMO | Source: Ambulatory Visit | Attending: Internal Medicine | Admitting: Internal Medicine

## 2022-08-28 ENCOUNTER — Ambulatory Visit (INDEPENDENT_AMBULATORY_CARE_PROVIDER_SITE_OTHER)
Admission: RE | Admit: 2022-08-28 | Discharge: 2022-08-28 | Disposition: A | Discharge status: Home / Self Care | Payer: Medicare HMO | Source: Ambulatory Visit | Attending: Internal Medicine | Admitting: Internal Medicine

## 2022-08-28 DIAGNOSIS — E041 Nontoxic single thyroid nodule: Secondary | ICD-10-CM

## 2022-08-28 DIAGNOSIS — M810 Age-related osteoporosis without current pathological fracture: Secondary | ICD-10-CM

## 2022-09-18 ENCOUNTER — Ambulatory Visit (INDEPENDENT_AMBULATORY_CARE_PROVIDER_SITE_OTHER): Payer: Medicare HMO

## 2022-09-18 VITALS — Ht 61.5 in | Wt 87.0 lb

## 2022-09-18 DIAGNOSIS — Z0000 Encounter for general adult medical examination without abnormal findings: Secondary | ICD-10-CM

## 2022-09-18 NOTE — Patient Instructions (Addendum)
Jennifer Stephenson , Thank you for taking time to come for your Medicare Wellness Visit. I appreciate your ongoing commitment to your health goals. Please review the following plan we discussed and let me know if I can assist you in the future.   These are the goals we discussed:  Goals      Client understands the importance of follow-up with providers by attending scheduled visits        This is a list of the screening recommended for you and due dates:  Health Maintenance  Topic Date Due   Medicare Annual Wellness Visit  09/18/2023   Mammogram  09/26/2023   DEXA scan (bone density measurement)  08/28/2024   DTaP/Tdap/Td vaccine (3 - Td or Tdap) 03/28/2029   Pneumonia Vaccine  Completed   Flu Shot  Completed   Hepatitis C Screening: USPSTF Recommendation to screen - Ages 18-79 yo.  Completed   Zoster (Shingles) Vaccine  Completed   HPV Vaccine  Aged Out   Colon Cancer Screening  Discontinued   COVID-19 Vaccine  Discontinued    Advanced directives: Yes; Please bring a copy of your health care power of attorney and living will to the office at your convenience.  Conditions/risks identified: Yes  Next appointment: Follow up in one year for your annual wellness visit.   Preventive Care 30 Years and Older, Female Preventive care refers to lifestyle choices and visits with your health care provider that can promote health and wellness. What does preventive care include? A yearly physical exam. This is also called an annual well check. Dental exams once or twice a year. Routine eye exams. Ask your health care provider how often you should have your eyes checked. Personal lifestyle choices, including: Daily care of your teeth and gums. Regular physical activity. Eating a healthy diet. Avoiding tobacco and drug use. Limiting alcohol use. Practicing safe sex. Taking low-dose aspirin every day. Taking vitamin and mineral supplements as recommended by your health care provider. What  happens during an annual well check? The services and screenings done by your health care provider during your annual well check will depend on your age, overall health, lifestyle risk factors, and family history of disease. Counseling  Your health care provider may ask you questions about your: Alcohol use. Tobacco use. Drug use. Emotional well-being. Home and relationship well-being. Sexual activity. Eating habits. History of falls. Memory and ability to understand (cognition). Work and work Statistician. Reproductive health. Screening  You may have the following tests or measurements: Height, weight, and BMI. Blood pressure. Lipid and cholesterol levels. These may be checked every 5 years, or more frequently if you are over 52 years old. Skin check. Lung cancer screening. You may have this screening every year starting at age 39 if you have a 30-pack-year history of smoking and currently smoke or have quit within the past 15 years. Fecal occult blood test (FOBT) of the stool. You may have this test every year starting at age 55. Flexible sigmoidoscopy or colonoscopy. You may have a sigmoidoscopy every 5 years or a colonoscopy every 10 years starting at age 50. Hepatitis C blood test. Hepatitis B blood test. Sexually transmitted disease (STD) testing. Diabetes screening. This is done by checking your blood sugar (glucose) after you have not eaten for a while (fasting). You may have this done every 1-3 years. Bone density scan. This is done to screen for osteoporosis. You may have this done starting at age 12. Mammogram. This may be done every  1-2 years. Talk to your health care provider about how often you should have regular mammograms. Talk with your health care provider about your test results, treatment options, and if necessary, the need for more tests. Vaccines  Your health care provider may recommend certain vaccines, such as: Influenza vaccine. This is recommended every  year. Tetanus, diphtheria, and acellular pertussis (Tdap, Td) vaccine. You may need a Td booster every 10 years. Zoster vaccine. You may need this after age 76. Pneumococcal 13-valent conjugate (PCV13) vaccine. One dose is recommended after age 62. Pneumococcal polysaccharide (PPSV23) vaccine. One dose is recommended after age 2. Talk to your health care provider about which screenings and vaccines you need and how often you need them. This information is not intended to replace advice given to you by your health care provider. Make sure you discuss any questions you have with your health care provider. Document Released: 07/27/2015 Document Revised: 03/19/2016 Document Reviewed: 05/01/2015 Elsevier Interactive Patient Education  2017 [place] Prevention in the Home Falls can cause injuries. They can happen to people of all ages. There are many things you can do to make your home safe and to help prevent falls. What can I do on the outside of my home? Regularly fix the edges of walkways and driveways and fix any cracks. Remove anything that might make you trip as you walk through a door, such as a raised step or threshold. Trim any bushes or trees on the path to your home. Use bright outdoor lighting. Clear any walking paths of anything that might make someone trip, such as rocks or tools. Regularly check to see if handrails are loose or broken. Make sure that both sides of any steps have handrails. Any raised decks and porches should have guardrails on the edges. Have any leaves, snow, or ice cleared regularly. Use sand or salt on walking paths during winter. Clean up any spills in your garage right away. This includes oil or grease spills. What can I do in the bathroom? Use night lights. Install grab bars by the toilet and in the tub and shower. Do not use towel bars as grab bars. Use non-skid mats or decals in the tub or shower. If you need to sit down in the shower, use a  plastic, non-slip stool. Keep the floor dry. Clean up any water that spills on the floor as soon as it happens. Remove soap buildup in the tub or shower regularly. Attach bath mats securely with double-sided non-slip rug tape. Do not have throw rugs and other things on the floor that can make you trip. What can I do in the bedroom? Use night lights. Make sure that you have a light by your bed that is easy to reach. Do not use any sheets or blankets that are too big for your bed. They should not hang down onto the floor. Have a firm chair that has side arms. You can use this for support while you get dressed. Do not have throw rugs and other things on the floor that can make you trip. What can I do in the kitchen? Clean up any spills right away. Avoid walking on wet floors. Keep items that you use a lot in easy-to-reach places. If you need to reach something above you, use a strong step stool that has a grab bar. Keep electrical cords out of the way. Do not use floor polish or wax that makes floors slippery. If you must use wax, use non-skid  floor wax. Do not have throw rugs and other things on the floor that can make you trip. What can I do with my stairs? Do not leave any items on the stairs. Make sure that there are handrails on both sides of the stairs and use them. Fix handrails that are broken or loose. Make sure that handrails are as long as the stairways. Check any carpeting to make sure that it is firmly attached to the stairs. Fix any carpet that is loose or worn. Avoid having throw rugs at the top or bottom of the stairs. If you do have throw rugs, attach them to the floor with carpet tape. Make sure that you have a light switch at the top of the stairs and the bottom of the stairs. If you do not have them, ask someone to add them for you. What else can I do to help prevent falls? Wear shoes that: Do not have high heels. Have rubber bottoms. Are comfortable and fit you  well. Are closed at the toe. Do not wear sandals. If you use a stepladder: Make sure that it is fully opened. Do not climb a closed stepladder. Make sure that both sides of the stepladder are locked into place. Ask someone to hold it for you, if possible. Clearly mark and make sure that you can see: Any grab bars or handrails. First and last steps. Where the edge of each step is. Use tools that help you move around (mobility aids) if they are needed. These include: Canes. Walkers. Scooters. Crutches. Turn on the lights when you go into a dark area. Replace any light bulbs as soon as they burn out. Set up your furniture so you have a clear path. Avoid moving your furniture around. If any of your floors are uneven, fix them. If there are any pets around you, be aware of where they are. Review your medicines with your doctor. Some medicines can make you feel dizzy. This can increase your chance of falling. Ask your doctor what other things that you can do to help prevent falls. This information is not intended to replace advice given to you by your health care provider. Make sure you discuss any questions you have with your health care provider. Document Released: 04/26/2009 Document Revised: 12/06/2015 Document Reviewed: 08/04/2014 Elsevier Interactive Patient Education  2017 [employer].

## 2022-09-18 NOTE — Progress Notes (Signed)
I connected with  Jennifer Stephenson on 09/18/22 by a audio enabled telemedicine application and verified that I am speaking with the correct person using two identifiers.  Patient Location: Home  Provider Location: Office/Clinic  I discussed the limitations of evaluation and management by telemedicine. The patient expressed understanding and agreed to proceed.  Subjective:   Jennifer Stephenson is a 72 y.o. female who presents for Medicare Annual (Subsequent) preventive examination.  Review of Systems     Cardiac Risk Factors include: advanced age (>63mn, >>73women);dyslipidemia;family history of premature cardiovascular disease;hypertension;smoking/ tobacco exposure     Objective:    Today's Vitals   09/18/22 0847  Weight: 87 lb (39.5 kg)  Height: 5' 1.5" (1.562 m)  PainSc: 3   PainLoc: Neck   Body mass index is 16.17 kg/m.     09/18/2022    8:49 AM 09/16/2021    8:50 AM 09/13/2020    9:12 AM 06/13/2019    8:10 AM 06/03/2018   10:05 AM  Advanced Directives  Does Patient Have a Medical Advance Directive? Yes Yes Yes No No  [place];Healthcare Power of Attorney Living will;Healthcare Power of Attorney    Does patient want to make changes to medical advance directive?  No - Patient declined No - Patient declined No - Patient declined   Copy of [place] Chart? No - copy requested No - copy requested No - copy requested    Would patient like information on creating a medical advance directive?     Yes (ED - Information included in AVS)    Current Medications (verified) Outpatient Encounter Medications as of 09/18/2022  Medication Sig   albuterol (VENTOLIN HFA) 108 (90 Base) MCG/ACT inhaler INHALE 2 PUFFS EVERY 6 (SIX) HOURS AS NEEDED INTO THE LUNGS FOR WHEEZING OR SHORTNESS OF BREATH.   aspirin 81 MG EC tablet Take 1 tablet (81 mg total) by mouth daily. Swallow whole.   Calcium-Vitamin D  600-125 MG-UNIT TABS Take by mouth daily. Take 2   diltiazem (TIADYLT ER) 180 MG 24 hr capsule TAKE 1 CAPSULE BY MOUTH EVERY DAY   famotidine (PEPCID) 20 MG tablet Take 1 tablet (20 mg total) by mouth 2 (two) times daily.   GARLIC OIL PO Take 1 tablet by mouth daily.   LORazepam (ATIVAN) 1 MG tablet TAKE 1/2 TABLETS BY MOUTH EVERY 8 HOURS AS NEEDED FOR ANXIETY   meclizine (ANTIVERT) 12.5 MG tablet Take 1-2 tablets (12.5-25 mg total) by mouth 3 (three) times daily as needed for dizziness.   vitamin E 180 MG (400 UNITS) capsule Take 400 Units by mouth daily.   No facility-administered encounter medications on file as of 09/18/2022.    Allergies (verified) Bupropion hcl, Paroxetine, Pentazocine lactate, Ranitidine, Lipitor [atorvastatin], Sulfamethoxazole-trimethoprim, Pravastatin, Prednisone, and Sulfonamide derivatives   History: Past Medical History:  Diagnosis Date   Adenomatous colon polyp 2007 & 2010   Anemia    PMH of    Anxiety    on meds   Cataract    bilateral   DDD (degenerative disc disease), cervical    Dysphagia    Emphysema of lung (HCC)    beginning stage   Endometriosis    Fibrocystic breast disease    Fracture of wrist, closed    bilaterally age 72(  fell skating )   GERD (gastroesophageal reflux disease)    on meds   HTN (hypertension)    on  meds   Hyperlipidemia    on meds   Interstitial cystitis    Osteopenia    [place] DEXA   Seasonal allergies    on meds   Syncope 07/14/2005   seen in ER ; Dx: dehydration   Past Surgical History:  Procedure Laterality Date   ABDOMINAL HYSTERECTOMY  07/14/1994   & BSO FOR ENDOMETRIOSIS   CATARACT EXTRACTION, BILATERAL     CERVICAL FUSION     X3:.2001 C4-6, 2007 C6-7,2012C3-4 (?)   COLONOSCOPY  07/15/2011   CG-F/V-movi(good)-normal   COLONOSCOPY W/ POLYPECTOMY  2007 & 2011   ADENOMATOUS polyp, internal hemorrhoids   EGD WITH ESOPHAGEAL DILATION  05/15/2009,07/03/11   ESOPHAGEAL MANOMETRY  10/27/2011    Procedure: ESOPHAGEAL MANOMETRY (EM);  Surgeon: Gatha Mayer, MD;  Location: [facility];  Service: Endoscopy;  Laterality: N/A;   SEPTOPLASTY     TUBAL LIGATION     WISDOM TOOTH EXTRACTION     Family History  Problem Relation Age of Onset   Dementia Mother    Ulcers Sister    Stroke Sister         X 2; both > 80   Esophageal cancer Sister 28   Ulcers Brother    Heart attack Brother         in 49s   Cancer Brother        PANCREATIC    Diabetes Brother        X 2   Stroke Brother        X2 , both > 24   Diabetes Paternal Uncle    Cancer Maternal Grandmother        CNS   Colon polyps Neg Hx    Colon cancer Neg Hx    Stomach cancer Neg Hx    Rectal cancer Neg Hx    Social History   Socioeconomic History   Marital status: Married    Spouse name: Not on file   Number of children: 2   Years of education: Not on file   Highest education level: Not on file  Occupational History   Occupation: DISABLED  Tobacco Use   Smoking status: Every Day    Packs/day: 0.75    Types: Cigarettes   Smokeless tobacco: Never   Tobacco comments:    smoked 1967- present, up to 1 ppd; now 1/2-3/4 ppd  Vaping Use   Vaping Use: Never used  Substance and Sexual Activity   Alcohol use: No   Drug use: No   Sexual activity: Yes  Other Topics Concern   Not on file  Social History Narrative    CAFFEINE 3 CUPS DAILY   Social Determinants of Health   Financial Resource Strain: Low Risk  (09/18/2022)   Overall Financial Resource Strain (CARDIA)    Difficulty of Paying Living Expenses: Not hard at all  Food Insecurity: No Food Insecurity (09/18/2022)   Hunger Vital Sign    Worried About Running Out of Food in the Last Year: Never true    Ran Out of Food in the Last Year: Never true  Transportation Needs: No Transportation Needs (09/18/2022)   PRAPARE - Hydrologist (Medical): No    Lack of Transportation (Non-Medical): No  Physical Activity: Sufficiently Active  (09/18/2022)   Exercise Vital Sign    Days of Exercise per Week: 5 days    Minutes of Exercise per Session: 30 min  Stress: No Stress Concern Present (09/18/2022)   [employer] of  Occupational Health - Occupational Stress Questionnaire    Feeling of Stress : Not at all  Social Connections: Socially Integrated (09/18/2022)   Social Connection and Isolation Panel [NHANES]    Frequency of Communication with Friends and Family: More than three times a week    Frequency of Social Gatherings with Friends and Family: Once a week    Attends Religious Services: More than 4 times per year    Active Member of [employer] or Organizations: No    Attends Music therapist: More than 4 times per year    Marital Status: Married    Tobacco Counseling Ready to quit: Not Answered Counseling given: Not Answered Tobacco comments: smoked 1967- present, up to 1 ppd; now 1/2-3/4 ppd   Clinical Intake:  Pre-visit preparation completed: Yes  Pain : No/denies pain Pain Score: 3      BMI - recorded: 16.17 Nutritional Status: BMI <19  Underweight Nutritional Risks: None Diabetes: No  How often do you need to have someone help you when you read instructions, pamphlets, or other written materials from your doctor or pharmacy?: 1 - Never What is the last grade level you completed in school?: HSG  Diabetic? No  Interpreter Needed?: No  Information entered by :: Lisette Abu, LPN.   Activities of Daily Living    09/18/2022    8:54 AM  In your present state of health, do you have any difficulty performing the following activities:  Hearing? 0  Vision? 0  Difficulty concentrating or making decisions? 0  Walking or climbing stairs? 0  Dressing or bathing? 0  Doing errands, shopping? 0  Preparing Food and eating ? N  Using the Toilet? N  In the past six months, have you accidently leaked urine? Y  Comment mini pads  Do you have problems with loss of bowel control? N  Managing your  Medications? N  Managing your Finances? N  Housekeeping or managing your Housekeeping? N    Patient Care Team: Binnie Rail, MD as PCP - General (Internal Medicine) Charlton Haws, [facility] as Pharmacist (Pharmacist) Laurence Aly, [place] (Optometry) Darleen Crocker, MD as Consulting Physician (Ophthalmology) Sherlynn Stalls, MD as Consulting Physician (Ophthalmology) Gatha Mayer, MD as Consulting Physician (Gastroenterology) Bobbye Charleston, MD as Consulting Physician (Obstetrics and Gynecology)  Indicate any recent Medical Services you may have received from other than [facility] providers in the past year (date may be approximate).     Assessment:   This is a routine wellness examination for Jennifer Stephenson.  Hearing/Vision screen Hearing Screening - Comments:: Denies hearing difficulties   Vision Screening - Comments:: Cataracts removed and lens implants - up to date with routine eye exams with Darleen Crocker, MD &amp; Sherlynn Stalls, MD.   Dietary issues and exercise activities discussed: Current Exercise Habits: Home exercise routine, Type of exercise: walking, Time (Minutes): 30, Frequency (Times/Week): 5, Weekly Exercise (Minutes/Week): 150, Intensity: Moderate, Exercise limited by: respiratory conditions(s)   Goals Addressed             This Visit's Progress    Client understands the importance of follow-up with providers by attending scheduled visits        Depression Screen    09/18/2022    8:53 AM 06/26/2022    7:55 AM 09/16/2021    8:40 AM 09/13/2020    9:29 AM 06/13/2019    8:13 AM 06/03/2018   10:06 AM 05/18/2017    1:50 PM  PHQ 2/9 Scores  PHQ - 2 Score  2 0 0 '2 2 2 '$ 0  PHQ- 9 Score '4 2   4 6     '$ Fall Risk    09/18/2022    8:50 AM 06/26/2022    7:55 AM 09/16/2021    8:40 AM 09/13/2020    9:13 AM 06/18/2020   10:03 AM  Fall Risk   Falls in the past year? 0 0 0 0 0  Number falls in past yr: 0 0 0 0 0  Injury with Fall? 0 0 0 0 0  Risk for fall due to : No Fall Risks No  Fall Risks No Fall Risks No Fall Risks History of fall(s);No Fall Risks  Follow up Falls prevention discussed Falls evaluation completed Falls evaluation completed Falls evaluation completed Falls evaluation completed    [place]:  Any stairs in or around the home? No  If so, are there any without handrails? No  Home free of loose throw rugs in walkways, pet beds, electrical cords, etc? Yes  Adequate lighting in your home to reduce risk of falls? Yes   ASSISTIVE DEVICES UTILIZED TO PREVENT FALLS:  Life alert? No  Use of a cane, walker or w/c? No  Grab bars in the bathroom? No  Shower chair or bench in shower? No  Elevated toilet seat or a handicapped toilet? Yes   TIMED UP AND GO:  Was the test performed? No . Telephonic Visit  Cognitive Function:        09/18/2022    8:51 AM  6CIT Screen  What Year? 0 points  What month? 0 points  What time? 0 points  Count back from 20 0 points  Months in reverse 0 points  Repeat phrase 0 points  Total Score 0 points    Immunizations Immunization History  Administered Date(s) Administered   Fluad Quad(high Dose 65+) 04/14/2022   Influenza Split 05/22/2011   Influenza Whole 05/09/2010, 04/18/2012, 04/13/2013   Influenza, High Dose Seasonal PF 04/28/2016, 04/20/2017, 04/13/2019, 05/08/2020, 04/08/2021   Influenza,inj,Quad PF,6+ Mos 05/11/2015   Influenza-Unspecified 04/17/2014, 04/13/2016, 04/20/2017   Pneumococcal Conjugate-13 10/04/2015   Pneumococcal Polysaccharide-23 10/20/2016   Td 03/07/2008   Tdap 03/29/2019   Zoster Recombinat (Shingrix) 10/27/2021, 12/28/2021    TDAP status: Up to date  Flu Vaccine status: Up to date  Pneumococcal vaccine status: Up to date  Covid-19 vaccine status: Declined, Education has been provided regarding the importance of this vaccine but patient still declined. Advised may receive this vaccine at local pharmacy or Health Dept.or vaccine clinic. Aware to  provide a copy of the vaccination record if obtained from local pharmacy or Health Dept. Verbalized acceptance and understanding.  Qualifies for Shingles Vaccine? Yes   Zostavax completed No   Shingrix Completed?: Yes  Screening Tests Health Maintenance  Topic Date Due   Medicare Annual Wellness (AWV)  09/18/2023   MAMMOGRAM  09/26/2023   DEXA SCAN  08/28/2024   DTaP/Tdap/Td (3 - Td or Tdap) 03/28/2029   Pneumonia Vaccine 109+ Years old  Completed   INFLUENZA VACCINE  Completed   Hepatitis C Screening  Completed   Zoster Vaccines- Shingrix  Completed   HPV VACCINES  Aged Out   COLONOSCOPY (Pts 45-53yr Insurance coverage will need to be confirmed)  Discontinued   COVID-19 Vaccine  Discontinued    Health Maintenance  There are no preventive care reminders to display for this patient.   Colorectal cancer screening: No longer required.   Mammogram status: Completed 09/25/2021. Repeat  every year  Bone Density status: Completed 08/28/2022. Results reflect: Bone density results: OSTEOPOROSIS. Repeat every 2 years.  Lung Cancer Screening: (Low Dose CT Chest recommended if Age 77-80 years, 30 pack-year currently smoking OR have quit w/in 15years.) does not qualify.   Lung Cancer Screening Referral: no  Additional Screening:  Hepatitis C Screening: does qualify; Completed 05/15/2016  Vision Screening: Recommended annual ophthalmology exams for early detection of glaucoma and other disorders of the eye. Is the patient up to date with their annual eye exam?  Yes  Who is the provider or what is the name of the office in which the patient attends annual eye exams? Darleen Crocker, MD & Sherlynn Stalls, MD If pt is not established with a provider, would they like to be referred to a provider to establish care? No .   Dental Screening: Recommended annual dental exams for proper oral hygiene  Community Resource Referral / Chronic Care Management: CRR required this visit?  No   CCM required  this visit?  No      Plan:     I have personally reviewed and noted the following in the patient's chart:   Medical and social history Use of alcohol, tobacco or illicit drugs  Current medications and supplements including opioid prescriptions. Patient is not currently taking opioid prescriptions. Functional ability and status Nutritional status Physical activity Advanced directives List of other physicians Hospitalizations, surgeries, and ER visits in previous 12 months Vitals Screenings to include cognitive, depression, and falls Referrals and appointments  In addition, I have reviewed and discussed with patient certain preventive protocols, quality metrics, and best practice recommendations. A written personalized care plan for preventive services as well as general preventive health recommendations were provided to patient.     Sheral Flow, LPN   [id number]   Nurse Notes:  Normal cognitive status assessed by direct observation by this Nurse Health Advisor. No abnormalities found.

## 2022-09-29 DIAGNOSIS — Z1231 Encounter for screening mammogram for malignant neoplasm of breast: Secondary | ICD-10-CM | POA: Diagnosis not present

## 2022-10-03 DIAGNOSIS — I10 Essential (primary) hypertension: Secondary | ICD-10-CM | POA: Diagnosis not present

## 2022-10-03 DIAGNOSIS — J439 Emphysema, unspecified: Secondary | ICD-10-CM | POA: Diagnosis not present

## 2022-10-03 DIAGNOSIS — Z8249 Family history of ischemic heart disease and other diseases of the circulatory system: Secondary | ICD-10-CM | POA: Diagnosis not present

## 2022-10-03 DIAGNOSIS — M1990 Unspecified osteoarthritis, unspecified site: Secondary | ICD-10-CM | POA: Diagnosis not present

## 2022-10-03 DIAGNOSIS — Z008 Encounter for other general examination: Secondary | ICD-10-CM | POA: Diagnosis not present

## 2022-10-03 DIAGNOSIS — K219 Gastro-esophageal reflux disease without esophagitis: Secondary | ICD-10-CM | POA: Diagnosis not present

## 2022-10-03 DIAGNOSIS — R32 Unspecified urinary incontinence: Secondary | ICD-10-CM | POA: Diagnosis not present

## 2022-10-03 DIAGNOSIS — Z823 Family history of stroke: Secondary | ICD-10-CM | POA: Diagnosis not present

## 2022-10-03 DIAGNOSIS — Z825 Family history of asthma and other chronic lower respiratory diseases: Secondary | ICD-10-CM | POA: Diagnosis not present

## 2022-10-03 DIAGNOSIS — Z882 Allergy status to sulfonamides status: Secondary | ICD-10-CM | POA: Diagnosis not present

## 2022-10-03 DIAGNOSIS — I739 Peripheral vascular disease, unspecified: Secondary | ICD-10-CM | POA: Diagnosis not present

## 2022-10-03 DIAGNOSIS — R636 Underweight: Secondary | ICD-10-CM | POA: Diagnosis not present

## 2022-10-03 DIAGNOSIS — R42 Dizziness and giddiness: Secondary | ICD-10-CM | POA: Diagnosis not present

## 2022-12-09 NOTE — Progress Notes (Unsigned)
    Subjective:    Patient ID: Jennifer Stephenson, female    DOB: 12/06/1950, 72 y.o.   MRN: [id number]      HPI Jennifer Stephenson is here for No chief complaint on file.        Medications and allergies reviewed with patient and updated if appropriate.  Current Outpatient Medications on File Prior to Visit  Medication Sig Dispense Refill   albuterol (VENTOLIN HFA) 108 (90 Base) MCG/ACT inhaler INHALE 2 PUFFS EVERY 6 (SIX) HOURS AS NEEDED INTO THE LUNGS FOR WHEEZING OR SHORTNESS OF BREATH. 36 g 8   aspirin 81 MG EC tablet Take 1 tablet (81 mg total) by mouth daily. Swallow whole. 30 tablet 12   Calcium-Vitamin D 600-125 MG-UNIT TABS Take by mouth daily. Take 2     diltiazem (TIADYLT ER) 180 MG 24 hr capsule TAKE 1 CAPSULE BY MOUTH EVERY DAY 90 capsule 3   famotidine (PEPCID) 20 MG tablet Take 1 tablet (20 mg total) by mouth 2 (two) times daily. 180 tablet 3   GARLIC OIL PO Take 1 tablet by mouth daily.     LORazepam (ATIVAN) 1 MG tablet TAKE 1/2 TABLETS BY MOUTH EVERY 8 HOURS AS NEEDED FOR ANXIETY 40 tablet 2   meclizine (ANTIVERT) 12.5 MG tablet Take 1-2 tablets (12.5-25 mg total) by mouth 3 (three) times daily as needed for dizziness. 30 tablet 5   vitamin E 180 MG (400 UNITS) capsule Take 400 Units by mouth daily.     No current facility-administered medications on file prior to visit.    Review of Systems     Objective:  There were no vitals filed for this visit. BP Readings from Last 3 Encounters:  06/26/22 136/74  12/06/21 (!) 148/78  11/28/21 (!) 146/67   Wt Readings from Last 3 Encounters:  09/18/22 87 lb (39.5 kg)  06/26/22 89 lb 6.4 oz (40.6 kg)  12/06/21 90 lb (40.8 kg)   There is no height or weight on file to calculate BMI.    Physical Exam         Assessment & Plan:    See Problem List for Assessment and Plan of chronic medical problems.

## 2022-12-10 ENCOUNTER — Ambulatory Visit (INDEPENDENT_AMBULATORY_CARE_PROVIDER_SITE_OTHER): Payer: Medicare HMO | Admitting: Internal Medicine

## 2022-12-10 ENCOUNTER — Encounter: Payer: Self-pay | Admitting: Internal Medicine

## 2022-12-10 VITALS — BP 120/74 | HR 65 | Temp 98.1°F | Ht 61.5 in | Wt 89.4 lb

## 2022-12-10 DIAGNOSIS — R42 Dizziness and giddiness: Secondary | ICD-10-CM

## 2022-12-10 DIAGNOSIS — R55 Syncope and collapse: Secondary | ICD-10-CM | POA: Insufficient documentation

## 2022-12-10 DIAGNOSIS — I10 Essential (primary) hypertension: Secondary | ICD-10-CM

## 2022-12-10 NOTE — Assessment & Plan Note (Signed)
Acute 2 episodes of dizziness/lightheadedness in the past 2 weeks ?  Cardiac cause versus dehydration EKG today Given no symptoms in between his episodes we will hold off on blood work

## 2022-12-10 NOTE — Assessment & Plan Note (Signed)
Chronic BP well controlled Continue tiadylt ER 180 mg daily Monitor BP at home closely given recent episodes

## 2022-12-10 NOTE — Assessment & Plan Note (Addendum)
2 episodes in the past 2 weeks of dizziness/lightheadedness that resulted in her suddenly dropping to the floor-no LOC No associated symptoms Very good chance that she was dehydrated so vasovagal is a likely possibility EKG here today: NSR at 61 bpm, possible LAE, LAD.  No change compared to EKG from 2014. Discussed echo and cardiology referral-she would like to hold off and work on hydration and see if this occurs again in which case she will let me know Stressed hydration Continue current blood pressure medication Monitor BP

## 2022-12-10 NOTE — Patient Instructions (Addendum)
      An EKG was normal.    Medications changes include :   none      Return if symptoms worsen or fail to improve.

## 2023-01-19 ENCOUNTER — Other Ambulatory Visit: Payer: Self-pay | Admitting: Internal Medicine

## 2023-01-19 DIAGNOSIS — F411 Generalized anxiety disorder: Secondary | ICD-10-CM

## 2023-03-06 ENCOUNTER — Emergency Department (HOSPITAL_BASED_OUTPATIENT_CLINIC_OR_DEPARTMENT_OTHER)
Admission: EM | Admit: 2023-03-06 | Discharge: 2023-03-06 | Disposition: A | Discharge status: Home / Self Care | Payer: Medicare HMO | Attending: Emergency Medicine | Admitting: Emergency Medicine

## 2023-03-06 ENCOUNTER — Other Ambulatory Visit (HOSPITAL_BASED_OUTPATIENT_CLINIC_OR_DEPARTMENT_OTHER): Payer: Self-pay

## 2023-03-06 ENCOUNTER — Encounter (HOSPITAL_BASED_OUTPATIENT_CLINIC_OR_DEPARTMENT_OTHER): Payer: Self-pay | Admitting: Emergency Medicine

## 2023-03-06 ENCOUNTER — Other Ambulatory Visit: Payer: Self-pay

## 2023-03-06 ENCOUNTER — Emergency Department (HOSPITAL_BASED_OUTPATIENT_CLINIC_OR_DEPARTMENT_OTHER): Payer: Medicare HMO | Admitting: Radiology

## 2023-03-06 DIAGNOSIS — X501XXA Overexertion from prolonged static or awkward postures, initial encounter: Secondary | ICD-10-CM | POA: Insufficient documentation

## 2023-03-06 DIAGNOSIS — Z7982 Long term (current) use of aspirin: Secondary | ICD-10-CM | POA: Diagnosis not present

## 2023-03-06 DIAGNOSIS — S29001A Unspecified injury of muscle and tendon of front wall of thorax, initial encounter: Secondary | ICD-10-CM | POA: Diagnosis not present

## 2023-03-06 DIAGNOSIS — S2231XA Fracture of one rib, right side, initial encounter for closed fracture: Secondary | ICD-10-CM | POA: Insufficient documentation

## 2023-03-06 MED ORDER — NAPROXEN 500 MG PO TABS: 500.0000 mg | ORAL_TABLET | Freq: Two times a day (BID) | ORAL | 0 refills | Status: DC

## 2023-03-06 MED ORDER — OXYCODONE HCL 5 MG PO TABS: 5.0000 mg | ORAL_TABLET | Freq: Four times a day (QID) | ORAL | 0 refills | Status: DC | PRN

## 2023-03-06 MED ORDER — BENZONATATE 100 MG PO CAPS: 100.0000 mg | ORAL_CAPSULE | Freq: Three times a day (TID) | ORAL | 0 refills | Status: DC

## 2023-03-06 NOTE — ED Triage Notes (Addendum)
Pt's nephew (a "large man") gave pt a tight hug on Sunday and she felt a "pop" in her lower lateral right rib cage.  +pain since.  Pt has taken tylenol and ibuprofen at home with some relief.  Pt reports sleep and breathing are being impacted by pain.  Pt denies SOB or chest pain. NAD in triage.

## 2023-03-06 NOTE — ED Notes (Signed)
ED Provider at bedside. 

## 2023-03-06 NOTE — ED Provider Notes (Signed)
[place] [facility] DEPARTMENT AT [facility] Provider Note   CSN: [id number] Arrival date & time: 03/06/23  1035     History  Chief Complaint  Patient presents with   Chest Pain    Rib pain    Jennifer Stephenson is a 72 y.o. female.   Chest Pain Associated symptoms: no fever and no shortness of breath    72 year old female, she has a history of tobacco use, she is a chronic smoker, she has no prior injuries to her lungs or her ribs however on Sunday about 5 days ago she was hugging her family member who gave her a "bear hug, she states he is a "mountain man".  She felt a pop, since that time she has had pain over the right anterolateral lower ribs around rib 9, she has had increasing pain with coughing and deep breathing or moving.  She denies shortness of breath otherwise.  She has been taking Tylenol and ibuprofen without relief.    Home Medications Prior to Admission medications   Medication Sig Start Date End Date Taking? Authorizing Provider  naproxen (NAPROSYN) 500 MG tablet Take 1 tablet (500 mg total) by mouth 2 (two) times daily with a meal. 03/06/23  Yes Eber Hong, MD  oxyCODONE (ROXICODONE) 5 MG immediate release tablet Take 1 tablet (5 mg total) by mouth every 6 (six) hours as needed for severe pain. 03/06/23  Yes Eber Hong, MD  albuterol (VENTOLIN HFA) 108 (90 Base) MCG/ACT inhaler INHALE 2 PUFFS EVERY 6 (SIX) HOURS AS NEEDED INTO THE LUNGS FOR WHEEZING OR SHORTNESS OF BREATH. 06/26/22   Pincus Sanes, MD  aspirin 81 MG EC tablet Take 1 tablet (81 mg total) by mouth daily. Swallow whole. 10/16/21   Pincus Sanes, MD  Calcium-Vitamin D [phone] MG-UNIT TABS Take by mouth daily. Take 2    [provider]  diltiazem (TIADYLT ER) 180 MG 24 hr capsule TAKE 1 CAPSULE BY MOUTH EVERY DAY 06/26/22   Pincus Sanes, MD  famotidine (PEPCID) 20 MG tablet Take 1 tablet (20 mg total) by mouth 2 (two) times daily. 06/26/22   Pincus Sanes, MD  GARLIC OIL PO  Take 1 tablet by mouth daily.    [provider]  LORazepam (ATIVAN) 1 MG tablet TAKE 1/2 TABLETS BY MOUTH EVERY 8 HOURS AS NEEDED FOR ANXIETY 01/19/23   Burns, Bobette Mo, MD  meclizine (ANTIVERT) 12.5 MG tablet Take 1-2 tablets (12.5-25 mg total) by mouth 3 (three) times daily as needed for dizziness. 05/22/22   Pincus Sanes, MD  vitamin E 180 MG (400 UNITS) capsule Take 400 Units by mouth daily.    [provider]      Allergies    Bupropion hcl, Paroxetine, Pentazocine lactate, Ranitidine, Lipitor [atorvastatin], Sulfamethoxazole-trimethoprim, Pravastatin, Prednisone, and Sulfonamide derivatives    Review of Systems   Review of Systems  Constitutional:  Negative for fever.  Respiratory:  Negative for shortness of breath.   Cardiovascular:  Positive for chest pain. Negative for leg swelling.    Physical Exam Updated Vital Signs BP (!) 177/73 (BP Location: Right Arm)   Pulse 66   Temp 98 F (36.7 C)   Resp 16   SpO2 95%  Physical Exam Vitals and nursing note reviewed.  Constitutional:      Appearance: She is well-developed. She is not diaphoretic.  HENT:     Head: Normocephalic and atraumatic.  Eyes:     General:  Right eye: No discharge.        Left eye: No discharge.     Conjunctiva/sclera: Conjunctivae normal.  Pulmonary:     Effort: Pulmonary effort is normal. No respiratory distress.     Comments: Focal tenderness without crepitance or obvious injury to the chest wall over the right lower anterolateral rib Chest:     Chest wall: Tenderness present.  Abdominal:     General: There is no distension.     Tenderness: There is no abdominal tenderness.  Skin:    General: Skin is warm and dry.     Findings: No erythema or rash.  Neurological:     Mental Status: She is alert.     Coordination: Coordination normal.     ED Results / Procedures / Treatments   Labs (all labs ordered are listed, but only abnormal results are displayed) Labs Reviewed  - No data to display  EKG None  Radiology DG Ribs Unilateral W/Chest Right  Result Date: 03/06/2023 CLINICAL DATA:  Pain + Injury.  Pain in right lower ribs. EXAM: RIGHT RIBS AND CHEST - 3+ VIEW COMPARISON:  06/01/2012. FINDINGS: Bilateral lung fields are clear. Bilateral costophrenic angles are clear. Normal cardio-mediastinal silhouette. There is mildly displaced fracture of the anterolateral right ninth rib. No other acute osseous abnormalities. Lower cervical spinal fixation hardware noted. The soft tissues are within normal limits. IMPRESSION: *Mildly displaced anterolateral right ninth rib fracture. Electronically Signed   By: Jules Schick M.D.   On: 03/06/2023 11:54    Procedures Procedures    Medications Ordered in ED Medications - No data to display  ED Course/ Medical Decision Making/ A&P                                 Medical Decision Making Amount and/or Complexity of Data Reviewed Radiology: ordered.  Risk Prescription drug management.   Lung sounds clear, focal minor trauma to the chest, possibly strain versus ligamentous injury versus fracture.  I personally viewed and interpret the x-ray of the chest I do see 1/9 Antero rib fractures of the ribs, awaiting radiology interpretation.  I agree with radiology interpretation  Anticipate home with anti-inflammatories and antitussives.        Final Clinical Impression(s) / ED Diagnoses Final diagnoses:  Fracture of one rib, right side, initial encounter for closed fracture    Rx / DC Orders ED Discharge Orders          Ordered    oxyCODONE (ROXICODONE) 5 MG immediate release tablet  Every 6 hours PRN        03/06/23 1210    naproxen (NAPROSYN) 500 MG tablet  2 times daily with meals        03/06/23 1210              Eber Hong, MD 03/06/23 [phone]

## 2023-03-06 NOTE — ED Notes (Signed)
Patient transported to X-ray 

## 2023-03-06 NOTE — Discharge Instructions (Signed)
You do have 1 rib that has a small break in it, this will take about 6 weeks to heal, I would recommend taking an anti-inflammatory with you choose ibuprofen or the Naprosyn which I have prescribed.  I have also prescribed a small amount of oxycodone for severe pain, this can make you nauseated or constipated so be careful taking this medication.  You can follow-up with your family doctor as needed.

## 2023-03-18 ENCOUNTER — Telehealth: Payer: Self-pay

## 2023-03-18 NOTE — Telephone Encounter (Signed)
Transition Care Management Unsuccessful Follow-up Telephone Call  Date of discharge and from where:  [facility] 8/23  Attempts:  1st Attempt  Reason for unsuccessful TCM follow-up call:  No answer/busy   Jennifer Stephenson  [facility], [facility] Guide, Phone: [phone] Website: [email or web address]

## 2023-03-19 ENCOUNTER — Telehealth: Payer: Self-pay

## 2023-03-19 NOTE — Telephone Encounter (Signed)
Transition Care Management Unsuccessful Follow-up Telephone Call  Date of discharge and from where:  [facility] 8/23  Attempts:  2nd Attempt  Reason for unsuccessful TCM follow-up call:  No answer/busy   Jennifer Stephenson  [facility], [facility] Guide, Phone: [phone] Website: [email or web address]

## 2023-05-04 ENCOUNTER — Other Ambulatory Visit: Payer: Self-pay | Admitting: Internal Medicine

## 2023-05-04 DIAGNOSIS — F411 Generalized anxiety disorder: Secondary | ICD-10-CM

## 2023-06-28 ENCOUNTER — Encounter: Payer: Self-pay | Admitting: Internal Medicine

## 2023-06-28 NOTE — Progress Notes (Signed)
Subjective:    Patient ID: Jennifer Stephenson, female    DOB: 1951/06/24, 72 y.o.   MRN: [id number]      HPI [name] is here for a Physical exam and her chronic medical problems.    Increased stress - takes lorazepam once a day - can not get through w/o it.    Still smoking - has tried and was not able to quit -- it has been a very tough year -- she will keep trying     Medications and allergies reviewed with patient and updated if appropriate.  Current Outpatient Medications on File Prior to Visit  Medication Sig Dispense Refill   albuterol (VENTOLIN HFA) 108 (90 Base) MCG/ACT inhaler INHALE 2 PUFFS EVERY 6 (SIX) HOURS AS NEEDED INTO THE LUNGS FOR WHEEZING OR SHORTNESS OF BREATH. 36 g 8   aspirin 81 MG EC tablet Take 1 tablet (81 mg total) by mouth daily. Swallow whole. 30 tablet 12   Calcium-Vitamin D 600-125 MG-UNIT TABS Take by mouth daily. Take 2     diltiazem (TIADYLT ER) 180 MG 24 hr capsule TAKE 1 CAPSULE BY MOUTH EVERY DAY 90 capsule 3   famotidine (PEPCID) 20 MG tablet Take 1 tablet (20 mg total) by mouth 2 (two) times daily. 180 tablet 3   GARLIC OIL PO Take 1 tablet by mouth daily.     LORazepam (ATIVAN) 1 MG tablet TAKE 1/2 TABLETS BY MOUTH EVERY 8 HOURS AS NEEDED FOR ANXIETY 40 tablet 0   meclizine (ANTIVERT) 12.5 MG tablet Take 1-2 tablets (12.5-25 mg total) by mouth 3 (three) times daily as needed for dizziness. 30 tablet 5   vitamin E 180 MG (400 UNITS) capsule Take 400 Units by mouth daily.     No current facility-administered medications on file prior to visit.    Review of Systems  Constitutional:  Negative for fever.  Eyes:  Positive for visual disturbance (blurry).  Respiratory:  Positive for cough (more since having covid) and wheezing (occ). Negative for shortness of breath.   Cardiovascular:  Negative for chest pain, palpitations and leg swelling.  Gastrointestinal:  Negative for abdominal pain, blood in stool, constipation and diarrhea.       No  gerd  Genitourinary:  Negative for dysuria.  Musculoskeletal:  Positive for back pain (occ). Negative for arthralgias.  Skin:  Negative for rash.  Neurological:  Negative for light-headedness and headaches.  Psychiatric/Behavioral:  Negative for dysphoric mood. The patient is nervous/anxious.        Objective:   Vitals:   07/03/23 0806  BP: 118/70  Pulse: 79  Temp: 98 F (36.7 C)  SpO2: 97%   Filed Weights   07/03/23 0806  Weight: 85 lb 3.2 oz (38.6 kg)   Body mass index is 15.84 kg/m.  BP Readings from Last 3 Encounters:  07/03/23 118/70  03/06/23 (!) 177/73  12/10/22 120/74    Wt Readings from Last 3 Encounters:  07/03/23 85 lb 3.2 oz (38.6 kg)  12/10/22 89 lb 6.4 oz (40.6 kg)  09/18/22 87 lb (39.5 kg)       Physical Exam Constitutional: She appears well-developed and well-nourished. No distress.  HENT:  Head: Normocephalic and atraumatic.  Right Ear: External ear normal. Normal ear canal and TM Left Ear: External ear normal.  Normal ear canal and TM Mouth/Throat: Oropharynx is clear and moist.  Eyes: Conjunctivae normal.  Neck: Neck supple. No tracheal deviation present. No thyromegaly present.  No carotid bruit  Cardiovascular:  Normal rate, regular rhythm and normal heart sounds.   No murmur heard.  No edema. Pulmonary/Chest: Effort normal and breath sounds normal. No respiratory distress. She has no wheezes. She has no rales.  Breast: deferred   Abdominal: Soft. She exhibits no distension. There is no tenderness.  Lymphadenopathy: She has no cervical adenopathy.  Skin: Skin is warm and dry. She is not diaphoretic.  Psychiatric: She has a normal mood and affect. Her behavior is normal.     Lab Results  Component Value Date   WBC 6.7 06/26/2022   HGB 15.5 (H) 06/26/2022   HCT 45.6 06/26/2022   PLT 234.0 06/26/2022   GLUCOSE 89 06/26/2022   CHOL 185 06/26/2022   TRIG 82.0 06/26/2022   HDL 56.60 06/26/2022   LDLDIRECT 148.6 06/06/2013   LDLCALC  112 (H) 06/26/2022   ALT 13 06/26/2022   AST 20 06/26/2022   NA 139 06/26/2022   K 4.3 06/26/2022   CL 105 06/26/2022   CREATININE 0.85 06/26/2022   BUN 19 06/26/2022   CO2 26 06/26/2022   TSH 1.39 06/26/2022    The 10-year ASCVD risk score (Arnett DK, et al., 2019) is: 21.5%   Values used to calculate the score:     Age: 41 years     Sex: Female     Is Non-Hispanic African American: No     Diabetic: No     Tobacco smoker: Yes     Systolic Blood Pressure: 118 mmHg     Is BP treated: Yes     HDL Cholesterol: 56.6 mg/dL     Total Cholesterol: 185 mg/dL      Assessment & Plan:   Physical exam: Screening blood work  ordered Exercise  regular-walking Weight  on low side - stable Substance abuse  active smoker - will keep trying to quit   Reviewed recommended immunizations.   Health Maintenance  Topic Date Due   Medicare Annual Wellness (AWV)  09/18/2023   MAMMOGRAM  09/26/2023   DEXA SCAN  08/28/2024   DTaP/Tdap/Td (3 - Td or Tdap) 03/28/2029   Pneumonia Vaccine 56+ Years old  Completed   INFLUENZA VACCINE  Completed   Hepatitis C Screening  Completed   Zoster Vaccines- Shingrix  Completed   HPV VACCINES  Aged Out   Colonoscopy  Discontinued   COVID-19 Vaccine  Discontinued          See Problem List for Assessment and Plan of chronic medical problems.

## 2023-07-01 ENCOUNTER — Encounter: Payer: Medicare HMO | Admitting: Internal Medicine

## 2023-07-03 ENCOUNTER — Telehealth: Payer: Self-pay

## 2023-07-03 ENCOUNTER — Ambulatory Visit (INDEPENDENT_AMBULATORY_CARE_PROVIDER_SITE_OTHER): Payer: Medicare HMO | Admitting: Internal Medicine

## 2023-07-03 VITALS — BP 118/70 | HR 79 | Temp 98.0°F | Ht 61.5 in | Wt 85.2 lb

## 2023-07-03 DIAGNOSIS — E7849 Other hyperlipidemia: Secondary | ICD-10-CM | POA: Diagnosis not present

## 2023-07-03 DIAGNOSIS — Z0000 Encounter for general adult medical examination without abnormal findings: Secondary | ICD-10-CM

## 2023-07-03 DIAGNOSIS — M810 Age-related osteoporosis without current pathological fracture: Secondary | ICD-10-CM

## 2023-07-03 DIAGNOSIS — F411 Generalized anxiety disorder: Secondary | ICD-10-CM | POA: Diagnosis not present

## 2023-07-03 DIAGNOSIS — K219 Gastro-esophageal reflux disease without esophagitis: Secondary | ICD-10-CM

## 2023-07-03 DIAGNOSIS — I10 Essential (primary) hypertension: Secondary | ICD-10-CM

## 2023-07-03 LAB — LIPID PANEL
Cholesterol: 171 mg/dL (ref 0–200)
HDL: 56.2 mg/dL (ref >39.00)
LDL Cholesterol: 101 mg/dL — ABNORMAL HIGH (ref 0–99)
NonHDL: 114.58
Total CHOL/HDL Ratio: 3
Triglycerides: 70 mg/dL (ref 0.0–149.0)
VLDL: 14 mg/dL (ref 0.0–40.0)

## 2023-07-03 LAB — COMPREHENSIVE METABOLIC PANEL
ALT: 13 U/L (ref 0–35)
AST: 18 U/L (ref 0–37)
Albumin: 4.2 g/dL (ref 3.5–5.2)
Alkaline Phosphatase: 70 U/L (ref 39–117)
BUN: 15 mg/dL (ref 6–23)
CO2: 26 meq/L (ref 19–32)
Calcium: 9.5 mg/dL (ref 8.4–10.5)
Chloride: 104 meq/L (ref 96–112)
Creatinine, Ser: 0.77 mg/dL (ref 0.40–1.20)
GFR: 76.56 mL/min (ref >60.00)
Glucose, Bld: 84 mg/dL (ref 70–99)
Potassium: 4.3 meq/L (ref 3.5–5.1)
Sodium: 139 meq/L (ref 135–145)
Total Bilirubin: 0.6 mg/dL (ref 0.2–1.2)
Total Protein: 7.4 g/dL (ref 6.0–8.3)

## 2023-07-03 LAB — CBC WITH DIFFERENTIAL/PLATELET
Basophils Absolute: 0.1 10*3/uL (ref 0.0–0.1)
Basophils Relative: 1.2 % (ref 0.0–3.0)
Eosinophils Absolute: 0.1 10*3/uL (ref 0.0–0.7)
Eosinophils Relative: 1.7 % (ref 0.0–5.0)
HCT: 45.3 % (ref 36.0–46.0)
Hemoglobin: 15.3 g/dL — ABNORMAL HIGH (ref 12.0–15.0)
Lymphocytes Relative: 18.5 % (ref 12.0–46.0)
Lymphs Abs: 1.1 10*3/uL (ref 0.7–4.0)
MCHC: 33.8 g/dL (ref 30.0–36.0)
MCV: 90.1 fL (ref 78.0–100.0)
Monocytes Absolute: 0.4 10*3/uL (ref 0.1–1.0)
Monocytes Relative: 7 % (ref 3.0–12.0)
Neutro Abs: 4.4 10*3/uL (ref 1.4–7.7)
Neutrophils Relative %: 71.6 % (ref 43.0–77.0)
Platelets: 232 10*3/uL (ref 150.0–400.0)
RBC: 5.03 Mil/uL (ref 3.87–5.11)
RDW: 14.3 % (ref 11.5–15.5)
WBC: 6.2 10*3/uL (ref 4.0–10.5)

## 2023-07-03 LAB — TSH: TSH: 1.12 u[IU]/mL (ref 0.35–5.50)

## 2023-07-03 LAB — VITAMIN D 25 HYDROXY (VIT D DEFICIENCY, FRACTURES): VITD: 51.69 ng/mL (ref 30.00–100.00)

## 2023-07-03 MED ORDER — EZETIMIBE 10 MG PO TABS: 10.0000 mg | ORAL_TABLET | Freq: Every day | ORAL | 5 refills | Status: DC

## 2023-07-03 NOTE — Assessment & Plan Note (Signed)
Chronic GERD controlled Continue famotidine 20 mg bid

## 2023-07-03 NOTE — Assessment & Plan Note (Addendum)
Chronic Regular exercise and healthy diet encouraged Check lipid panel, tsh ASCVD risk is high Statin intolerant Agrees to try zetia 10 mg daily

## 2023-07-03 NOTE — Telephone Encounter (Signed)
-----   Message from Pincus Sanes sent at 07/03/2023 10:28 AM EST ----- Regarding: OP Would like to see if she can get Evenity for her OP - she is not a good candidate for fosamax due to GERD.   Would benefit from bone building medication.

## 2023-07-03 NOTE — Assessment & Plan Note (Signed)
Chronic Increased anxiety/stress Overall controlled Continue lorazepam 0.5 mg every 8 hours as needed

## 2023-07-03 NOTE — Assessment & Plan Note (Signed)
Chronic BP well controlled CBC, CMP Continue tiadylt ER 180 mg daily Monitor BP at home closely given recent episodes

## 2023-07-03 NOTE — Assessment & Plan Note (Addendum)
Chronic DEXA up-to-date and has worsened Continue regular exercise Continue calcium and vitamin D Check vitamin D level, tsh, pth  Not a good candidate for oral bisphosphonate secondary to reflux.  Would recommend ideally like to do Evenity and then Prolia  Can do reclast if insurance requires but really needs bone building med

## 2023-07-03 NOTE — Patient Instructions (Addendum)
Blood work was ordered.       Medications changes include :   zetia 10 mg daily for cholesterol     Return in about 1 year (around 07/02/2024) for Physical Exam.    Health Maintenance, Female Adopting a healthy lifestyle and getting preventive care are important in promoting health and wellness. Ask your health care provider about: The right schedule for you to have regular tests and exams. Things you can do on your own to prevent diseases and keep yourself healthy. What should I know about diet, weight, and exercise? Eat a healthy diet  Eat a diet that includes plenty of vegetables, fruits, low-fat dairy products, and lean protein. Do not eat a lot of foods that are high in solid fats, added sugars, or sodium. Maintain a healthy weight Body mass index (BMI) is used to identify weight problems. It estimates body fat based on height and weight. Your health care provider can help determine your BMI and help you achieve or maintain a healthy weight. Get regular exercise Get regular exercise. This is one of the most important things you can do for your health. Most adults should: Exercise for at least 150 minutes each week. The exercise should increase your heart rate and make you sweat (moderate-intensity exercise). Do strengthening exercises at least twice a week. This is in addition to the moderate-intensity exercise. Spend less time sitting. Even light physical activity can be beneficial. Watch cholesterol and blood lipids Have your blood tested for lipids and cholesterol at 73 years of age, then have this test every 5 years. Have your cholesterol levels checked more often if: Your lipid or cholesterol levels are high. You are older than 73 years of age. You are at high risk for heart disease. What should I know about cancer screening? Depending on your health history and family history, you may need to have cancer screening at various ages. This may include screening  for: Breast cancer. Cervical cancer. Colorectal cancer. Skin cancer. Lung cancer. What should I know about heart disease, diabetes, and high blood pressure? Blood pressure and heart disease High blood pressure causes heart disease and increases the risk of stroke. This is more likely to develop in people who have high blood pressure readings or are overweight. Have your blood pressure checked: Every 3-5 years if you are 56-67 years of age. Every year if you are 20 years old or older. Diabetes Have regular diabetes screenings. This checks your fasting blood sugar level. Have the screening done: Once every three years after age 42 if you are at a normal weight and have a low risk for diabetes. More often and at a younger age if you are overweight or have a high risk for diabetes. What should I know about preventing infection? Hepatitis B If you have a higher risk for hepatitis B, you should be screened for this virus. Talk with your health care provider to find out if you are at risk for hepatitis B infection. Hepatitis C Testing is recommended for: Everyone born from [birth year] through 1966. Anyone with known risk factors for hepatitis C. Sexually transmitted infections (STIs) Get screened for STIs, including gonorrhea and chlamydia, if: You are sexually active and are younger than 73 years of age. You are older than 73 years of age and your health care provider tells you that you are at risk for this type of infection. Your sexual activity has changed since you were last screened, and you are at  increased risk for chlamydia or gonorrhea. Ask your health care provider if you are at risk. Ask your health care provider about whether you are at high risk for HIV. Your health care provider may recommend a prescription medicine to help prevent HIV infection. If you choose to take medicine to prevent HIV, you should first get tested for HIV. You should then be tested every 3 months for as long as you  are taking the medicine. Pregnancy If you are about to stop having your period (premenopausal) and you may become pregnant, seek counseling before you get pregnant. Take 400 to 800 micrograms (mcg) of folic acid every day if you become pregnant. Ask for birth control (contraception) if you want to prevent pregnancy. Osteoporosis and menopause Osteoporosis is a disease in which the bones lose minerals and strength with aging. This can result in bone fractures. If you are 46 years old or older, or if you are at risk for osteoporosis and fractures, ask your health care provider if you should: Be screened for bone loss. Take a calcium or vitamin D supplement to lower your risk of fractures. Be given hormone replacement therapy (HRT) to treat symptoms of menopause. Follow these instructions at home: Alcohol use Do not drink alcohol if: Your health care provider tells you not to drink. You are pregnant, may be pregnant, or are planning to become pregnant. If you drink alcohol: Limit how much you have to: 0-1 drink a day. Know how much alcohol is in your drink. In the [place] one drink equals one 12 oz bottle of beer (355 mL), one 5 oz glass of wine (148 mL), or one 1 oz glass of hard liquor (44 mL). Lifestyle Do not use any products that contain nicotine or tobacco. These products include cigarettes, chewing tobacco, and vaping devices, such as e-cigarettes. If you need help quitting, ask your health care provider. Do not use street drugs. Do not share needles. Ask your health care provider for help if you need support or information about quitting drugs. General instructions Schedule regular health, dental, and eye exams. Stay current with your vaccines. Tell your health care provider if: You often feel depressed. You have ever been abused or do not feel safe at home. Summary Adopting a healthy lifestyle and getting preventive care are important in promoting health and wellness. Follow your  health care provider's instructions about healthy diet, exercising, and getting tested or screened for diseases. Follow your health care provider's instructions on monitoring your cholesterol and blood pressure. This information is not intended to replace advice given to you by your health care provider. Make sure you discuss any questions you have with your health care provider. Document Revised: 11/19/2020 Document Reviewed: 11/19/2020 Elsevier Patient Education  2024 [employer].

## 2023-07-05 LAB — PTH, INTACT AND CALCIUM
Calcium: 9.4 mg/dL (ref 8.6–10.4)
PTH: 57 pg/mL (ref 16–77)

## 2023-07-06 ENCOUNTER — Telehealth: Payer: Self-pay

## 2023-07-06 NOTE — Telephone Encounter (Addendum)
Pharmacy Patient Advocate Encounter   Received notification from  [employer] Portal that prior authorization for Evenity is required/requested.   Insurance verification completed.   The patient is insured through [employer] .    Please advise, thanks.  Authorization number: [id number]

## 2023-07-10 ENCOUNTER — Other Ambulatory Visit: Payer: Self-pay | Admitting: Internal Medicine

## 2023-07-11 ENCOUNTER — Other Ambulatory Visit: Payer: Self-pay | Admitting: Internal Medicine

## 2023-07-11 DIAGNOSIS — F411 Generalized anxiety disorder: Secondary | ICD-10-CM

## 2023-07-14 ENCOUNTER — Telehealth: Payer: Self-pay

## 2023-07-14 NOTE — Telephone Encounter (Signed)
noted 

## 2023-07-14 NOTE — Telephone Encounter (Signed)
 Prolia VOB initiated via [email or web address]

## 2023-07-14 NOTE — Telephone Encounter (Signed)
Changed to Prolia

## 2023-07-15 ENCOUNTER — Other Ambulatory Visit: Payer: Self-pay | Admitting: Internal Medicine

## 2023-07-15 DIAGNOSIS — J4520 Mild intermittent asthma, uncomplicated: Secondary | ICD-10-CM

## 2023-07-16 ENCOUNTER — Other Ambulatory Visit: Payer: Self-pay | Admitting: Internal Medicine

## 2023-07-16 DIAGNOSIS — F411 Generalized anxiety disorder: Secondary | ICD-10-CM

## 2023-07-16 NOTE — Telephone Encounter (Signed)
 Copied from CRM [id number]. Topic: Clinical - Medication Refill >> Jul 16, 2023 10:39 AM Evie NOVAK wrote: Most Recent Primary Care Visit:  Provider: BURNS, GLADE PARAS  Department: [facility]  Visit Type: PHYSICAL  Date: 07/03/2023  Medication: LORazepam  (ATIVAN ) 1 MG tablet  Has the patient contacted their pharmacy? Yes (Agent: If no, request that the patient contact the pharmacy for the refill. If patient does not wish to contact the pharmacy document the reason why and proceed with request.) (Agent: If yes, when and what did the pharmacy advise?)  Is this the correct pharmacy for this prescription? Yes If no, delete pharmacy and type the correct one.  This is the patient's preferred pharmacy:  [id number] - [place], [place] - [address] AT [facility] [address] [place] [address] Phone: [phone] Fax: [phone]    Has the prescription been filled recently? Yes  Is the patient out of the medication? Yes  Has the patient been seen for an appointment in the last year OR does the patient have an upcoming appointment? Yes  Can we respond through MyChart? No  Agent: Please be advised that Rx refills may take up to 3 business days. We ask that you follow-up with your pharmacy.

## 2023-07-17 ENCOUNTER — Ambulatory Visit (INDEPENDENT_AMBULATORY_CARE_PROVIDER_SITE_OTHER): Payer: Medicare HMO | Admitting: Nurse Practitioner

## 2023-07-17 VITALS — BP 128/64 | HR 81 | Temp 98.3°F | Ht 61.5 in | Wt 85.4 lb

## 2023-07-17 DIAGNOSIS — R059 Cough, unspecified: Secondary | ICD-10-CM | POA: Diagnosis not present

## 2023-07-17 LAB — POC COVID19 BINAXNOW: SARS Coronavirus 2 Ag: NEGATIVE

## 2023-07-17 LAB — POCT RESPIRATORY SYNCYTIAL VIRUS: RSV Rapid Ag: NEGATIVE

## 2023-07-17 LAB — POCT INFLUENZA A/B
Influenza A, POC: NEGATIVE
Influenza B, POC: NEGATIVE

## 2023-07-17 MED ORDER — AZITHROMYCIN 250 MG PO TABS: ORAL_TABLET | ORAL | 0 refills | Status: AC

## 2023-07-17 MED ORDER — PROMETHAZINE-DM 6.25-15 MG/5ML PO SYRP: 5.0000 mL | ORAL_SOLUTION | Freq: Four times a day (QID) | ORAL | 0 refills | Status: DC | PRN

## 2023-07-17 NOTE — Progress Notes (Signed)
 Established Patient Office Visit  Subjective   Patient ID: Jennifer Stephenson, female    DOB: August 02, 1950  Age: 73 y.o. MRN: [id number]  Chief Complaint  Patient presents with   Cough    For about a week now, nasal congestion, worse in the morning, tested negative for Covid monday    Patient arrives for acute visit, symptom onset 5 days ago.  She has been experiencing fatigue, congestion, productive cough, shortness of breath, and wheezing especially in the morning.  She does use albuterol  inhaler and has history of reactive airway disease which has been managing her wheezing.  She also is experiencing headache.    Review of Systems  Constitutional:  Positive for malaise/fatigue. Negative for chills and fever.  HENT:  Positive for congestion.        (+) runny nose  Respiratory:  Positive for cough, sputum production, shortness of breath and wheezing (in the morning).   Cardiovascular:  Negative for chest pain and palpitations.  Musculoskeletal:  Negative for myalgias.  Neurological:  Positive for headaches.      Objective:     BP 128/64   Pulse 81   Temp 98.3 F (36.8 C) (Temporal)   Ht 5' 1.5 (1.562 m)   Wt 85 lb 6 oz (38.7 kg)   SpO2 95%   BMI 15.87 kg/m    Physical Exam Vitals reviewed.  Constitutional:      General: She is not in acute distress.    Appearance: Normal appearance.  HENT:     Head: Normocephalic and atraumatic.  Cardiovascular:     Rate and Rhythm: Normal rate and regular rhythm.     Pulses: Normal pulses.     Heart sounds: Normal heart sounds.  Pulmonary:     Effort: Pulmonary effort is normal.     Breath sounds: Wheezing present.  Skin:    General: Skin is warm and dry.  Neurological:     General: No focal deficit present.     Mental Status: She is alert and oriented to person, place, and time.  Psychiatric:        Mood and Affect: Mood normal.        Behavior: Behavior normal.        Judgment: Judgment normal.      Results for  orders placed or performed in visit on 07/17/23  POC COVID-19  Result Value Ref Range   SARS Coronavirus 2 Ag Negative Negative  POCT Influenza A/B  Result Value Ref Range   Influenza A, POC Negative Negative   Influenza B, POC Negative Negative  POCT respiratory syncytial virus  Result Value Ref Range   RSV Rapid Ag negative       The 10-year ASCVD risk score (Arnett DK, et al., 2019) is: 24.5%    Assessment & Plan:   Problem List Items Addressed This Visit       Other   Cough - Primary   Acute Likely started as a viral etiology, concern for progression to possible bacterial bronchitis.  Patient does have some mild wheezing on exam, but vital signs are stable with pulse oximeter reading of 95% on room air.  Point-of-care COVID, flu, RSV all negative today.  Patient does appear ill on physical exam as far as clearly feeling uncomfortable and tired.  But as stated above vital signs are stable.  Recommend continue symptomatic treatment with rest, hydration, promethazine  dextromethorphan cough syrup as needed.  She was warned to avoid driving or operating heavy  machinery while taking the cough syrup at night.  She reports her understanding.  Does not tolerate prednisone so will not prescribe this for her.  She will continue taking albuterol  inhaler as needed for wheezing.  She was told if symptoms persist for an additional 4 to 5 days or if they get significantly worse before then she should start antibiotic treatment, will send in Z-Pak so she has this on hand in the event that she needs it.  If despite this treatment plan things worsen or do not improve she was told to return to office for further evaluation.  She reports her understanding.      Relevant Medications   azithromycin  (ZITHROMAX ) 250 MG tablet   promethazine -dextromethorphan (PROMETHAZINE -DM) 6.25-15 MG/5ML syrup   Other Relevant Orders   POC COVID-19 (Completed)   POCT Influenza A/B (Completed)   POCT respiratory  syncytial virus (Completed)    Return if symptoms worsen or fail to improve.    Jennifer FORBES Pereyra, NP

## 2023-07-17 NOTE — Telephone Encounter (Signed)
 Pharmacy Patient Advocate Encounter   Received notification from  [employer] Portal that prior authorization for Prolia  is required/requested.   Insurance verification completed.   The patient is insured through [employer] .   Per test claim: PA required; PA submitted to above mentioned insurance via Availity Key/confirmation #/EOC [id number] Status is pending

## 2023-07-17 NOTE — Assessment & Plan Note (Signed)
 Acute Likely started as a viral etiology, concern for progression to possible bacterial bronchitis.  Patient does have some mild wheezing on exam, but vital signs are stable with pulse oximeter reading of 95% on room air.  Point-of-care COVID, flu, RSV all negative today.  Patient does appear ill on physical exam as far as clearly feeling uncomfortable and tired.  But as stated above vital signs are stable.  Recommend continue symptomatic treatment with rest, hydration, promethazine  dextromethorphan cough syrup as needed.  She was warned to avoid driving or operating heavy machinery while taking the cough syrup at night.  She reports her understanding.  Does not tolerate prednisone so will not prescribe this for her.  She will continue taking albuterol  inhaler as needed for wheezing.  She was told if symptoms persist for an additional 4 to 5 days or if they get significantly worse before then she should start antibiotic treatment, will send in Z-Pak so she has this on hand in the event that she needs it.  If despite this treatment plan things worsen or do not improve she was told to return to office for further evaluation.  She reports her understanding.

## 2023-07-17 NOTE — Telephone Encounter (Signed)
 Jennifer Stephenson

## 2023-07-24 ENCOUNTER — Other Ambulatory Visit (HOSPITAL_COMMUNITY): Payer: Self-pay

## 2023-07-24 NOTE — Telephone Encounter (Signed)
 Pharmacy Patient Advocate Encounter  Received notification from [employer] that Prior Authorization for Prolia has been APPROVED from 07/17/23 to 07/16/24   PA #/Case ID/Reference #: [id number]

## 2023-07-24 NOTE — Telephone Encounter (Signed)
 Pt ready for scheduling for Prolia  on or after : 07/24/23  Out-of-pocket cost due at time of visit: $350  Number of injection/visits approved: 2  Primary: Prolia  Prolia  co-insurance: 20% Admin fee co-insurance: 20%  Secondary: N/A Prolia  co-insurance:  Admin fee co-insurance:   Medical Benefit Details: Date Benefits were checked: 07/15/23 Deductible: no/ Coinsurance: 20%/ Admin Fee: 20%  Prior Auth: Approved PA# [id number] Expiration Date: 07/17/23 to 07/16/24  # of doses approved:  Pharmacy benefit: Copay $645.79 If patient wants fill through the pharmacy benefit please send prescription to:  [facility] , and include estimated need by date in rx notes. Pharmacy will ship medication directly to the office.  Patient not eligible for Prolia  Copay Card. Copay Card can make patient's cost as little as $25. Link to apply: https://www.amgensupportplus.com/copay  ** This summary of benefits is an estimation of the patient's out-of-pocket cost. Exact cost may very based on individual plan coverage.

## 2023-07-28 NOTE — Telephone Encounter (Signed)
 Patient scheduled for Monday.

## 2023-07-30 ENCOUNTER — Other Ambulatory Visit: Payer: Self-pay | Admitting: Internal Medicine

## 2023-08-03 ENCOUNTER — Telehealth: Payer: Self-pay

## 2023-08-03 ENCOUNTER — Ambulatory Visit: Payer: Medicare HMO

## 2023-08-03 DIAGNOSIS — M810 Age-related osteoporosis without current pathological fracture: Secondary | ICD-10-CM | POA: Diagnosis not present

## 2023-08-03 MED ORDER — DENOSUMAB 60 MG/ML ~~LOC~~ SOSY: 60.0000 mg | PREFILLED_SYRINGE | Freq: Once | SUBCUTANEOUS | Status: DC

## 2023-08-03 MED ORDER — DENOSUMAB 60 MG/ML ~~LOC~~ SOSY
60.0000 mg | PREFILLED_SYRINGE | Freq: Once | SUBCUTANEOUS | Status: AC
  Administered 2023-08-03: 60 mg via SUBCUTANEOUS

## 2023-08-03 NOTE — Progress Notes (Signed)
Pt was given Prolia injec w/o any complications at this time.

## 2023-08-03 NOTE — Telephone Encounter (Signed)
Pt came in today to do her 1st Prolia injection. Pt states that the $350 co-pay is to high of a cost and is wanting to get pt assistance if possible to help with the oop cost.

## 2023-08-06 ENCOUNTER — Other Ambulatory Visit: Payer: Self-pay | Admitting: Pharmacist

## 2023-08-06 NOTE — Progress Notes (Signed)
   08/06/2023 Name: Jennifer Stephenson MRN: [id number] DOB: 10-20-1950  Chief Complaint  Patient presents with   Prolia Medication Access   Spoke to patient regarding Prolia patient assistance since she is unable to afford the Prolia copay moving forward.I spoke to [employer] and was told that patient's with Medicare are not eligible for Prolia PAP. There are currently no grants open for osteoporosis as well.   Informed pt of the above.   Recommending changing to alendronate 70 mg once weekly instead for a cheaper alternative. She shouldn't need to start for 6 months since she just got her Prolia injection on 08/03/23. Will send to PCP for her thoughts.  PCP is okay with doing alendronate. Pt will be due to start in July. Will set a reminder for July to send in Rx and follow up with pt.  Arbutus Leas, PharmD, BCPS, CPP Clinical Pharmacist Practitioner [facility] [phone]

## 2023-09-15 ENCOUNTER — Ambulatory Visit (INDEPENDENT_AMBULATORY_CARE_PROVIDER_SITE_OTHER)

## 2023-09-15 ENCOUNTER — Other Ambulatory Visit: Payer: Self-pay | Admitting: Internal Medicine

## 2023-09-15 VITALS — Ht 61.5 in | Wt 85.0 lb

## 2023-09-15 DIAGNOSIS — Z0000 Encounter for general adult medical examination without abnormal findings: Secondary | ICD-10-CM | POA: Diagnosis not present

## 2023-09-15 DIAGNOSIS — F411 Generalized anxiety disorder: Secondary | ICD-10-CM

## 2023-09-15 NOTE — Progress Notes (Signed)
 Subjective:   Jennifer Stephenson is a 73 y.o. who presents for a Medicare Wellness preventive visit.  Visit Complete: Virtual I connected with  Jennifer Stephenson on 09/15/23 by a audio enabled telemedicine application and verified that I am speaking with the correct person using two identifiers.  Patient Location: Home  Provider Location: Home Office  I discussed the limitations of evaluation and management by telemedicine. The patient expressed understanding and agreed to proceed.  Vital Signs: Because this visit was a virtual/telehealth visit, some criteria may be missing or patient reported. Any vitals not documented were not able to be obtained and vitals that have been documented are patient reported.  VideoDeclined- This patient declined Librarian, academic. Therefore the visit was completed with audio only.  AWV Questionnaire: No: Patient Medicare AWV questionnaire was not completed prior to this visit.  Cardiac Risk Factors include: advanced age (>41men, >70 women);hypertension;dyslipidemia     Objective:    Today's Vitals   09/15/23 0923  Weight: 85 lb (38.6 kg)  Height: 5' 1.5" (1.562 m)   Body mass index is 15.8 kg/m.     09/15/2023    9:37 AM 03/06/2023   11:29 AM 09/18/2022    8:49 AM 09/16/2021    8:50 AM 09/13/2020    9:12 AM 06/13/2019    8:10 AM 06/03/2018   10:05 AM  Advanced Directives  Does Patient Have a Medical Advance Directive? Yes Yes Yes Yes Yes No No  Type of Estate agent of [place];Living will Healthcare Power of [employer] of [place];Living will Living will;Healthcare Power of Attorney Living will;Healthcare Power of Attorney    Does patient want to make changes to medical advance directive?  No - Patient declined  No - Patient declined No - Patient declined No - Patient declined   Copy of Healthcare Power of Attorney in Chart? No - copy requested No - copy requested No - copy requested  No - copy requested No - copy requested    Would patient like information on creating a medical advance directive?       Yes (ED - Information included in AVS)    Current Medications (verified) Outpatient Encounter Medications as of 09/15/2023  Medication Sig   albuterol (VENTOLIN HFA) 108 (90 Base) MCG/ACT inhaler INHALE 2 PUFFS EVERY 6 (SIX) HOURS AS NEEDED INTO THE LUNGS FOR WHEEZING OR SHORTNESS OF BREATH.   aspirin 81 MG EC tablet Take 1 tablet (81 mg total) by mouth daily. Swallow whole.   Calcium-Vitamin D 600-125 MG-UNIT TABS Take by mouth daily. Take 2   ezetimibe (ZETIA) 10 MG tablet Take 1 tablet (10 mg total) by mouth daily.   famotidine (PEPCID) 20 MG tablet TAKE 1 TABLET BY MOUTH TWICE A DAY   GARLIC OIL PO Take 1 tablet by mouth daily.   LORazepam (ATIVAN) 1 MG tablet TAKE 1/2 TABLETS BY MOUTH EVERY 8 HOURS AS NEEDED FOR ANXIETY   meclizine (ANTIVERT) 12.5 MG tablet Take 1-2 tablets (12.5-25 mg total) by mouth 3 (three) times daily as needed for dizziness.   TIADYLT ER 180 MG 24 hr capsule TAKE 1 CAPSULE BY MOUTH EVERY DAY   vitamin E 180 MG (400 UNITS) capsule Take 400 Units by mouth daily.   promethazine-dextromethorphan (PROMETHAZINE-DM) 6.25-15 MG/5ML syrup Take 5 mLs by mouth 4 (four) times daily as needed for cough. (Patient not taking: Reported on 09/15/2023)   No facility-administered encounter medications on file as of 09/15/2023.  Allergies (verified) Bupropion hcl, Paroxetine, Pentazocine lactate, Ranitidine, Lipitor [atorvastatin], Sulfamethoxazole-trimethoprim, Pravastatin, Prednisone, and Sulfonamide derivatives   History: Past Medical History:  Diagnosis Date   Adenomatous colon polyp 2007 & 2010   Anemia    PMH of    Anxiety    on meds   Cataract    bilateral   DDD (degenerative disc disease), cervical    Dysphagia    Emphysema of lung (HCC)    beginning stage   Endometriosis    Fibrocystic breast disease    Fracture of wrist, closed    bilaterally  age 36 (  fell skating )   GERD (gastroesophageal reflux disease)    on meds   HTN (hypertension)    on meds   Hyperlipidemia    on meds   Interstitial cystitis    Osteopenia    [place] DEXA   Seasonal allergies    on meds   Syncope 07/14/2005   seen in ER ; Dx: dehydration   Past Surgical History:  Procedure Laterality Date   ABDOMINAL HYSTERECTOMY  07/14/1994   & BSO FOR ENDOMETRIOSIS   CATARACT EXTRACTION, BILATERAL     CERVICAL FUSION     X3:.2001 C4-6, 2007 C6-7,2012C3-4 (?)   COLONOSCOPY  07/15/2011   CG-F/V-movi(good)-normal   COLONOSCOPY W/ POLYPECTOMY  2007 & 2011   ADENOMATOUS polyp, internal hemorrhoids   EGD WITH ESOPHAGEAL DILATION  05/15/2009,07/03/11   ESOPHAGEAL MANOMETRY  10/27/2011   Procedure: ESOPHAGEAL MANOMETRY (EM);  Surgeon: Iva Boop, MD;  Location: [facility];  Service: Endoscopy;  Laterality: N/A;   SEPTOPLASTY     TUBAL LIGATION     WISDOM TOOTH EXTRACTION     Family History  Problem Relation Age of Onset   Dementia Mother    Ulcers Sister    Stroke Sister         X 2; both > 65   Esophageal cancer Sister 44   Ulcers Brother    Heart attack Brother         in 63s   Cancer Brother        PANCREATIC    Diabetes Brother        X 2   Stroke Brother        X2 , both > 55   Diabetes Paternal Uncle    Cancer Maternal Grandmother        CNS   Colon polyps Neg Hx    Colon cancer Neg Hx    Stomach cancer Neg Hx    Rectal cancer Neg Hx    Social History   Socioeconomic History   Marital status: Married    Spouse name: Ewing   Number of children: 2   Years of education: Not on file   Highest education level: Not on file  Occupational History   Occupation: DISABLED  Tobacco Use   Smoking status: Every Day    Current packs/day: 0.75    Types: Cigarettes   Smokeless tobacco: Never   Tobacco comments:    smoked 1967- present, up to 1 ppd; now 1/2-3/4 ppd  Vaping Use   Vaping status: Never Used  Substance and Sexual Activity    Alcohol use: No   Drug use: No   Sexual activity: Yes  Other Topics Concern   Not on file  Social History Narrative    CAFFEINE 3 CUPS DAILY   Lives at home with husband./2025   Social Drivers of Health   Financial Resource Strain: Low Risk  (  09/15/2023)   Overall Financial Resource Strain (CARDIA)    Difficulty of Paying Living Expenses: Not hard at all  Food Insecurity: No Food Insecurity (09/15/2023)   Hunger Vital Sign    Worried About Running Out of Food in the Last Year: Never true    Ran Out of Food in the Last Year: Never true  Transportation Needs: No Transportation Needs (09/15/2023)   PRAPARE - Administrator, Civil Service (Medical): No    Lack of Transportation (Non-Medical): No  Physical Activity: Inactive (09/15/2023)   Exercise Vital Sign    Days of Exercise per Week: 0 days    Minutes of Exercise per Session: 0 min  Stress: Stress Concern Present (09/15/2023)   [employer] of Occupational Health - Occupational Stress Questionnaire    Feeling of Stress : Very much  Social Connections: Socially Integrated (09/15/2023)   Social Connection and Isolation Panel [NHANES]    Frequency of Communication with Friends and Family: More than three times a week    Frequency of Social Gatherings with Friends and Family: Never    Attends Religious Services: More than 4 times per year    Active Member of [employer] or Organizations: Yes    Attends Banker Meetings: Never    Marital Status: Married    Tobacco Counseling Ready to quit: Not Answered Counseling given: Not Answered Tobacco comments: smoked 1967- present, up to 1 ppd; now 1/2-3/4 ppd    Clinical Intake:  Pre-visit preparation completed: Yes  Pain : No/denies pain     BMI - recorded: 15.8 Nutritional Status: BMI <19  Underweight Nutritional Risks: None Diabetes: No  How often do you need to have someone help you when you read instructions, pamphlets, or other written materials from  your doctor or pharmacy?: 1 - Never  Interpreter Needed?: No  Information entered by :: Mitsuko Luera, RMA   Activities of Daily Living     09/15/2023    9:24 AM 09/18/2022    8:54 AM  In your present state of health, do you have any difficulty performing the following activities:  Hearing? 0 0  Vision? 0 0  Difficulty concentrating or making decisions? 0 0  Walking or climbing stairs? 0 0  Dressing or bathing? 0 0  Doing errands, shopping? 0 0  Preparing Food and eating ? N N  Using the Toilet? N N  In the past six months, have you accidently leaked urine? Y Y  Comment  mini pads  Do you have problems with loss of bowel control? N N  Managing your Medications? N N  Managing your Finances? N N  Housekeeping or managing your Housekeeping? N N    Patient Care Team: Pincus Sanes, MD as PCP - General (Internal Medicine) Kathyrn Sheriff, [facility] (Inactive) as Pharmacist (Pharmacist) Ander Purpura, OD (Optometry) Mia Creek, MD as Consulting Physician (Ophthalmology) Stephannie Li, MD as Consulting Physician (Ophthalmology) Iva Boop, MD as Consulting Physician (Gastroenterology) Carrington Clamp, MD as Consulting Physician (Obstetrics and Gynecology)  Indicate any recent Medical Services you may have received from other than [facility] providers in the past year (date may be approximate).     Assessment:   This is a routine wellness examination for [place].  Hearing/Vision screen Hearing Screening - Comments:: Denies hearing difficulties   Vision Screening - Comments:: Had Cataract surgery    Goals Addressed               This Visit's Progress  Patient Stated (pt-stated)        Would like to quite smoking       Depression Screen     09/15/2023    9:42 AM 07/17/2023    4:09 PM 07/03/2023    8:15 AM 09/18/2022    8:53 AM 06/26/2022    7:55 AM 09/16/2021    8:40 AM 09/13/2020    9:29 AM  PHQ 2/9 Scores  PHQ - 2 Score 1 0 1 2 0 0 2  PHQ- 9 Score 1   4 2        Fall Risk     09/15/2023    9:38 AM 07/17/2023    4:09 PM 07/03/2023    8:15 AM 09/18/2022    8:50 AM 06/26/2022    7:55 AM  Fall Risk   Falls in the past year? 0 0 0 0 0  Number falls in past yr: 0 0 0 0 0  Injury with Fall? 0 0 0 0 0  Risk for fall due to : No Fall Risks No Fall Risks No Fall Risks No Fall Risks No Fall Risks  Follow up Falls prevention discussed;Falls evaluation completed Falls evaluation completed Falls evaluation completed Falls prevention discussed Falls evaluation completed    MEDICARE RISK AT HOME:  Medicare Risk at Home Any stairs in or around the home?: No Home free of loose throw rugs in walkways, pet beds, electrical cords, etc?: Yes Adequate lighting in your home to reduce risk of falls?: Yes Life alert?: No Use of a cane, walker or w/c?: No Grab bars in the bathroom?: No Shower chair or bench in shower?: No Elevated toilet seat or a handicapped toilet?: No  TIMED UP AND GO:  Was the test performed?  No  Cognitive Function: 6CIT completed        09/15/2023    9:38 AM 09/18/2022    8:51 AM  6CIT Screen  What Year? 0 points 0 points  What month? 0 points 0 points  What time? 0 points 0 points  Count back from 20 0 points 0 points  Months in reverse 0 points 0 points  Repeat phrase 0 points 0 points  Total Score 0 points 0 points    Immunizations Immunization History  Administered Date(s) Administered   Fluad Quad(high Dose 65+) 04/14/2022   Influenza Split 05/22/2011   Influenza Whole 05/09/2010, 04/18/2012, 04/13/2013   Influenza, High Dose Seasonal PF 04/28/2016, 04/20/2017, 04/13/2019, 05/08/2020, 04/08/2021, 04/22/2023   Influenza,inj,Quad PF,6+ Mos 05/11/2015   Influenza-Unspecified 04/17/2014, 04/13/2016, 04/20/2017   Pneumococcal Conjugate-13 10/04/2015   Pneumococcal Polysaccharide-23 10/20/2016   Rsv, Bivalent, Protein Subunit Rsvpref,pf (Abrysvo) 05/26/2023   Td 03/07/2008   Tdap 03/29/2019   Zoster Recombinant(Shingrix)  10/27/2021, 12/28/2021    Screening Tests Health Maintenance  Topic Date Due   MAMMOGRAM  09/26/2023   DEXA SCAN  08/28/2024   Medicare Annual Wellness (AWV)  09/14/2024   DTaP/Tdap/Td (3 - Td or Tdap) 03/28/2029   Pneumonia Vaccine 4+ Years old  Completed   INFLUENZA VACCINE  Completed   Hepatitis C Screening  Completed   Zoster Vaccines- Shingrix  Completed   HPV VACCINES  Aged Out   Colonoscopy  Discontinued   COVID-19 Vaccine  Discontinued    Health Maintenance  There are no preventive care reminders to display for this patient.  Health Maintenance Items Addressed: See Nurse Notes  Additional Screening:  Vision Screening: Recommended annual ophthalmology exams for early detection of glaucoma and other disorders of the  eye.  Dental Screening: Recommended annual dental exams for proper oral hygiene  Community Resource Referral / Chronic Care Management: CRR required this visit?  No   CCM required this visit?  No     Plan:     I have personally reviewed and noted the following in the patient's chart:   Medical and social history Use of alcohol, tobacco or illicit drugs  Current medications and supplements including opioid prescriptions. Patient is not currently taking opioid prescriptions. Functional ability and status Nutritional status Physical activity Advanced directives List of other physicians Hospitalizations, surgeries, and ER visits in previous 12 months Vitals Screenings to include cognitive, depression, and falls Referrals and appointments  In addition, I have reviewed and discussed with patient certain preventive protocols, quality metrics, and best practice recommendations. A written personalized care plan for preventive services as well as general preventive health recommendations were provided to patient.     Cuthbert Turton L Kallon Caylor, CMA   09/15/2023   After Visit Summary: (MyChart) Due to this being a telephonic visit, the after visit summary  with patients personalized plan was offered to patient via MyChart   Notes: Please refer to Routing Comments.

## 2023-09-15 NOTE — Patient Instructions (Signed)
 Ms. Sweatt , Thank you for taking time to come for your Medicare Wellness Visit. I appreciate your ongoing commitment to your health goals. Please review the following plan we discussed and let me know if I can assist you in the future.   Referrals/Orders/Follow-Ups/Clinician Recommendations: It was nice talking with you today.  Each day, aim for 6 glasses of water, plenty of protein in your diet and try to get up and walk/ stretch every hour for 5-10 minutes at a time.    This is a list of the screening recommended for you and due dates:  Health Maintenance  Topic Date Due   Medicare Annual Wellness Visit  09/18/2023   Mammogram  09/26/2023   DEXA scan (bone density measurement)  08/28/2024   DTaP/Tdap/Td vaccine (3 - Td or Tdap) 03/28/2029   Pneumonia Vaccine  Completed   Flu Shot  Completed   Hepatitis C Screening  Completed   Zoster (Shingles) Vaccine  Completed   HPV Vaccine  Aged Out   Colon Cancer Screening  Discontinued   COVID-19 Vaccine  Discontinued    Advanced directives: (Copy Requested) Please bring a copy of your health care power of attorney and living will to the office to be added to your chart at your convenience.  Next Medicare Annual Wellness Visit scheduled for next year: Yes

## 2023-10-01 DIAGNOSIS — Z01419 Encounter for gynecological examination (general) (routine) without abnormal findings: Secondary | ICD-10-CM | POA: Diagnosis not present

## 2023-10-01 DIAGNOSIS — Z1231 Encounter for screening mammogram for malignant neoplasm of breast: Secondary | ICD-10-CM | POA: Diagnosis not present

## 2023-10-07 DIAGNOSIS — H43822 Vitreomacular adhesion, left eye: Secondary | ICD-10-CM | POA: Diagnosis not present

## 2023-10-07 DIAGNOSIS — H31091 Other chorioretinal scars, right eye: Secondary | ICD-10-CM | POA: Diagnosis not present

## 2023-10-07 DIAGNOSIS — H524 Presbyopia: Secondary | ICD-10-CM | POA: Diagnosis not present

## 2023-10-07 DIAGNOSIS — H35341 Macular cyst, hole, or pseudohole, right eye: Secondary | ICD-10-CM | POA: Diagnosis not present

## 2023-10-07 DIAGNOSIS — Z961 Presence of intraocular lens: Secondary | ICD-10-CM | POA: Diagnosis not present

## 2023-10-07 DIAGNOSIS — H43392 Other vitreous opacities, left eye: Secondary | ICD-10-CM | POA: Diagnosis not present

## 2023-12-17 NOTE — Patient Instructions (Addendum)
      Medications changes include :   zpak, cough syrup.  Use the albuterol  inhaler.      Return if symptoms worsen or fail to improve.

## 2023-12-17 NOTE — Progress Notes (Unsigned)
 Subjective:    Patient ID: Jennifer Stephenson, female    DOB: 1951-01-09, 73 y.o.   MRN: [id number]      HPI Latroya is here for No chief complaint on file.   She is here for an acute visit for cold symptoms.   Her symptoms started   She is experiencing   She has tried taking       Medications and allergies reviewed with patient and updated if appropriate.  Current Outpatient Medications on File Prior to Visit  Medication Sig Dispense Refill   albuterol  (VENTOLIN  HFA) 108 (90 Base) MCG/ACT inhaler INHALE 2 PUFFS EVERY 6 (SIX) HOURS AS NEEDED INTO THE LUNGS FOR WHEEZING OR SHORTNESS OF BREATH. 25.5 each 8   aspirin  81 MG EC tablet Take 1 tablet (81 mg total) by mouth daily. Swallow whole. 30 tablet 12   Calcium -Vitamin D  600-125 MG-UNIT TABS Take by mouth daily. Take 2     ezetimibe  (ZETIA ) 10 MG tablet Take 1 tablet (10 mg total) by mouth daily. 30 tablet 5   famotidine  (PEPCID ) 20 MG tablet TAKE 1 TABLET BY MOUTH TWICE A DAY 180 tablet 3   GARLIC OIL PO Take 1 tablet by mouth daily.     LORazepam  (ATIVAN ) 1 MG tablet TAKE 1/2 TABLET BY MOUTH UP TO EVERY 8 HOURS AS NEEDED FOR ANXIETY 40 tablet 2   meclizine  (ANTIVERT ) 12.5 MG tablet Take 1-2 tablets (12.5-25 mg total) by mouth 3 (three) times daily as needed for dizziness. 30 tablet 5   promethazine -dextromethorphan (PROMETHAZINE -DM) 6.25-15 MG/5ML syrup Take 5 mLs by mouth 4 (four) times daily as needed for cough. (Patient not taking: Reported on 09/15/2023) 118 mL 0   TIADYLT  ER 180 MG 24 hr capsule TAKE 1 CAPSULE BY MOUTH EVERY DAY 90 capsule 3   vitamin E 180 MG (400 UNITS) capsule Take 400 Units by mouth daily.     No current facility-administered medications on file prior to visit.    Review of Systems     Objective:  There were no vitals filed for this visit. BP Readings from Last 3 Encounters:  07/17/23 128/64  07/03/23 118/70  03/06/23 (!) 177/73   Wt Readings from Last 3 Encounters:  09/15/23 85 lb (38.6  kg)  07/17/23 85 lb 6 oz (38.7 kg)  07/03/23 85 lb 3.2 oz (38.6 kg)   There is no height or weight on file to calculate BMI.    Physical Exam Constitutional:      General: She is not in acute distress.    Appearance: Normal appearance. She is not ill-appearing.  HENT:     Head: Normocephalic and atraumatic.     Right Ear: Tympanic membrane, ear canal and external ear normal.     Left Ear: Tympanic membrane, ear canal and external ear normal.     Mouth/Throat:     Mouth: Mucous membranes are moist.     Pharynx: No oropharyngeal exudate or posterior oropharyngeal erythema.  Eyes:     Conjunctiva/sclera: Conjunctivae normal.  Cardiovascular:     Rate and Rhythm: Normal rate and regular rhythm.  Pulmonary:     Effort: Pulmonary effort is normal. No respiratory distress.     Breath sounds: Normal breath sounds. No wheezing or rales.  Musculoskeletal:     Cervical back: Neck supple. No tenderness.  Lymphadenopathy:     Cervical: No cervical adenopathy.  Skin:    General: Skin is warm and dry.  Neurological:  Mental Status: She is alert.            Assessment & Plan:    See Problem List for Assessment and Plan of chronic medical problems.

## 2023-12-18 ENCOUNTER — Encounter: Payer: Self-pay | Admitting: Internal Medicine

## 2023-12-18 ENCOUNTER — Ambulatory Visit: Admitting: Internal Medicine

## 2023-12-18 VITALS — BP 132/78 | HR 80 | Temp 98.7°F | Ht 61.5 in | Wt 87.0 lb

## 2023-12-18 DIAGNOSIS — R059 Cough, unspecified: Secondary | ICD-10-CM

## 2023-12-18 DIAGNOSIS — I10 Essential (primary) hypertension: Secondary | ICD-10-CM | POA: Diagnosis not present

## 2023-12-18 DIAGNOSIS — J4520 Mild intermittent asthma, uncomplicated: Secondary | ICD-10-CM

## 2023-12-18 DIAGNOSIS — F17210 Nicotine dependence, cigarettes, uncomplicated: Secondary | ICD-10-CM | POA: Diagnosis not present

## 2023-12-18 MED ORDER — AZITHROMYCIN 250 MG PO TABS: ORAL_TABLET | ORAL | 0 refills | Status: DC

## 2023-12-18 MED ORDER — PROMETHAZINE-DM 6.25-15 MG/5ML PO SYRP: 5.0000 mL | ORAL_SOLUTION | Freq: Four times a day (QID) | ORAL | 0 refills | Status: DC | PRN

## 2023-12-18 NOTE — Assessment & Plan Note (Signed)
 Acute Started almost 2 weeks ago - not improved despite otc medications and time Concern of developing bacterial infection with currently smoking and chest tightness, SOB Continue working on smoking cessation Zpak Promethazine -DM cough syrup

## 2023-12-18 NOTE — Assessment & Plan Note (Signed)
 Chronic BP well controlled Continue tiadylt  ER 180 mg daily

## 2023-12-18 NOTE — Assessment & Plan Note (Signed)
 Chronic Current flare due to URI,smoking Continue smoking cessation Not able to take steroids Continue albuterol  as needed - has used it a little but advised to try to use it more consistently

## 2023-12-18 NOTE — Assessment & Plan Note (Signed)
 Chronic Has decreased her smoking to < 1/2 ppd and will continue to work on it She has changed when she smokes  Encouraged her to continue working on full cessation

## 2023-12-25 ENCOUNTER — Other Ambulatory Visit: Payer: Self-pay | Admitting: Internal Medicine

## 2024-01-28 ENCOUNTER — Telehealth: Payer: Self-pay | Admitting: Pharmacist

## 2024-01-28 DIAGNOSIS — M810 Age-related osteoporosis without current pathological fracture: Secondary | ICD-10-CM

## 2024-01-28 MED ORDER — ALENDRONATE SODIUM 70 MG PO TABS: 70.0000 mg | ORAL_TABLET | ORAL | 1 refills | Status: DC

## 2024-01-28 NOTE — Telephone Encounter (Signed)
   01/28/2024 Name: Jennifer Stephenson MRN: [id number] DOB: 05-Feb-1951  Chief Complaint  Patient presents with   Medication Management   Spoke to patient regarding switch from Prolia  to alendronate  as discussed 08/06/23 Patient Outreach encounter. Last Prolia  injection was 08/03/23 therefore, she can start alendronate  next week.  Counseled on proper administration and side effects. Rx sent.  Darrelyn Drum, PharmD, BCPS, CPP Clinical Pharmacist Practitioner [facility] [phone]

## 2024-04-20 ENCOUNTER — Other Ambulatory Visit: Payer: Self-pay | Admitting: Internal Medicine

## 2024-04-20 DIAGNOSIS — F411 Generalized anxiety disorder: Secondary | ICD-10-CM

## 2024-04-26 NOTE — Telephone Encounter (Signed)
 Pt had to r/s her physical and is concern about refilling meds before her next appt which is a month after her original appt. Pt didn't let me know the meds she need refilled but she will like for someone to contact her in clinical-- Tobias is aware of this encounter. Please advise, Thanks

## 2024-06-26 ENCOUNTER — Other Ambulatory Visit: Payer: Self-pay | Admitting: Internal Medicine

## 2024-06-29 ENCOUNTER — Encounter: Payer: Self-pay | Admitting: Internal Medicine

## 2024-06-29 NOTE — Patient Instructions (Addendum)

## 2024-06-29 NOTE — Progress Notes (Unsigned)
 Subjective:    Patient ID: Jennifer Stephenson, female    DOB: 01/26/1951, 73 y.o.   MRN: [id number]      HPI Sullivan is here for a Physical exam and her chronic medical problems.   BP 134/74 at home.  It has been well controlled.  Stress level is high.   No concerns.    Medications and allergies reviewed with patient and updated if appropriate.  Medications Ordered Prior to Encounter[1]  Review of Systems  Constitutional:  Negative for fever.  Eyes:  Negative for visual disturbance.  Respiratory:  Positive for cough (occ) and shortness of breath (sometimes). Negative for wheezing.   Cardiovascular:  Negative for chest pain, palpitations and leg swelling.  Gastrointestinal:  Negative for abdominal pain, blood in stool, constipation and diarrhea.       No gerd  Genitourinary:  Negative for dysuria.  Musculoskeletal:  Positive for arthralgias (hip pain) and back pain (sometimes).  Skin:  Negative for rash.  Neurological:  Positive for light-headedness (occ). Negative for headaches.  Psychiatric/Behavioral:  Negative for dysphoric mood. The patient is nervous/anxious.        Objective:   Vitals:   06/30/24 0814 06/30/24 0856  BP: (!) 150/100 (!) 148/92  Pulse: 74   Temp: 98.7 F (37.1 C)   SpO2: 98%    Filed Weights   06/30/24 0814  Weight: 89 lb 9.6 oz (40.6 kg)   Body mass index is 16.93 kg/m.  BP Readings from Last 3 Encounters:  06/30/24 (!) 148/92  12/18/23 132/78  07/17/23 128/64    Wt Readings from Last 3 Encounters:  06/30/24 89 lb 9.6 oz (40.6 kg)  12/18/23 87 lb (39.5 kg)  09/15/23 85 lb (38.6 kg)       Physical Exam Constitutional: She appears well-developed and well-nourished. No distress.  HENT:  Head: Normocephalic and atraumatic.  Right Ear: External ear normal. Normal ear canal and TM Left Ear: External ear normal.  Normal ear canal and TM Mouth/Throat: Oropharynx is clear and moist.  Eyes: Conjunctivae normal.  Neck: Neck supple.  No tracheal deviation present. No thyromegaly present.  No carotid bruit  Cardiovascular: Normal rate, regular rhythm and normal heart sounds.   No murmur heard.  No edema. Pulmonary/Chest: Effort normal and breath sounds normal. No respiratory distress. She has no wheezes. She has no rales.  Breast: deferred   Abdominal: Soft. She exhibits no distension. There is no tenderness.  Lymphadenopathy: She has no cervical adenopathy.  Skin: Skin is warm and dry. She is not diaphoretic.  Psychiatric: She has a normal mood and affect. Her behavior is normal.     Lab Results  Component Value Date   WBC 6.4 06/30/2024   HGB 13.8 06/30/2024   HCT 40.7 06/30/2024   PLT 246.0 06/30/2024   GLUCOSE 115 (H) 06/30/2024   CHOL 171 06/30/2024   TRIG 89.0 06/30/2024   HDL 52.30 06/30/2024   LDLDIRECT 148.6 06/06/2013   LDLCALC 101 (H) 06/30/2024   ALT 13 06/30/2024   AST 19 06/30/2024   NA 132 (L) 06/30/2024   K 4.3 06/30/2024   CL 99 06/30/2024   CREATININE 0.70 06/30/2024   BUN 14 06/30/2024   CO2 25 06/30/2024   TSH 1.42 06/30/2024         Assessment & Plan:   Physical exam: Screening blood work  ordered Exercise  walking Weight  low - stable Substance abuse-smoking-working on cessation - 1/2 ppd   Reviewed recommended immunizations.  Health Maintenance  Topic Date Due   Mammogram  09/26/2023   Bone Density Scan  08/28/2024   Medicare Annual Wellness (AWV)  09/14/2024   DTaP/Tdap/Td (3 - Td or Tdap) 03/28/2029   Pneumococcal Vaccine: 50+ Years  Completed   Influenza Vaccine  Completed   Hepatitis C Screening  Completed   Zoster Vaccines- Shingrix  Completed   Meningococcal B Vaccine  Aged Out   Colonoscopy  Discontinued   COVID-19 Vaccine  Discontinued     Requested mammogram report    See Problem List for Assessment and Plan of chronic medical problems.         [1]  Current Outpatient Medications on File Prior to Visit  Medication Sig Dispense Refill    albuterol  (VENTOLIN  HFA) 108 (90 Base) MCG/ACT inhaler INHALE 2 PUFFS EVERY 6 (SIX) HOURS AS NEEDED INTO THE LUNGS FOR WHEEZING OR SHORTNESS OF BREATH. 25.5 each 8   aspirin  81 MG EC tablet Take 1 tablet (81 mg total) by mouth daily. Swallow whole. 30 tablet 12   Calcium -Vitamin D  600-125 MG-UNIT TABS Take by mouth daily. Take 2     GARLIC OIL PO Take 1 tablet by mouth daily.     LORazepam  (ATIVAN ) 1 MG tablet TAKE 1/2 TABLET BY MOUTH UP TO EVERY 8 HOURS AS NEEDED FOR ANXIETY 40 tablet 2   meclizine  (ANTIVERT ) 12.5 MG tablet Take 1-2 tablets (12.5-25 mg total) by mouth 3 (three) times daily as needed for dizziness. 30 tablet 5   vitamin E 180 MG (400 UNITS) capsule Take 400 Units by mouth daily.     No current facility-administered medications on file prior to visit.

## 2024-06-29 NOTE — Assessment & Plan Note (Signed)
 Chronic DEXA up-to-date  Continue regular exercise Continue calcium  and vitamin D  Check vitamin D  level, CBC, CMP Did Prolia  times 15 July 2023-after that it was not affordable Started Fosamax  01/2024-plan to continue for 5 years if tolerated

## 2024-06-30 ENCOUNTER — Ambulatory Visit: Admitting: Internal Medicine

## 2024-06-30 VITALS — BP 148/92 | HR 74 | Temp 98.7°F | Ht 61.0 in | Wt 89.6 lb

## 2024-06-30 DIAGNOSIS — F17210 Nicotine dependence, cigarettes, uncomplicated: Secondary | ICD-10-CM | POA: Diagnosis not present

## 2024-06-30 DIAGNOSIS — K219 Gastro-esophageal reflux disease without esophagitis: Secondary | ICD-10-CM | POA: Diagnosis not present

## 2024-06-30 DIAGNOSIS — Z0000 Encounter for general adult medical examination without abnormal findings: Secondary | ICD-10-CM

## 2024-06-30 DIAGNOSIS — F411 Generalized anxiety disorder: Secondary | ICD-10-CM | POA: Diagnosis not present

## 2024-06-30 DIAGNOSIS — M810 Age-related osteoporosis without current pathological fracture: Secondary | ICD-10-CM

## 2024-06-30 DIAGNOSIS — E7800 Pure hypercholesterolemia, unspecified: Secondary | ICD-10-CM | POA: Diagnosis not present

## 2024-06-30 DIAGNOSIS — I10 Essential (primary) hypertension: Secondary | ICD-10-CM | POA: Diagnosis not present

## 2024-06-30 LAB — CBC WITH DIFFERENTIAL/PLATELET
Basophils Absolute: 0.1 K/uL (ref 0.0–0.1)
Basophils Relative: 1.1 % (ref 0.0–3.0)
Eosinophils Absolute: 0.1 K/uL (ref 0.0–0.7)
Eosinophils Relative: 1.2 % (ref 0.0–5.0)
HCT: 40.7 % (ref 36.0–46.0)
Hemoglobin: 13.8 g/dL (ref 12.0–15.0)
Lymphocytes Relative: 17.2 % (ref 12.0–46.0)
Lymphs Abs: 1.1 K/uL (ref 0.7–4.0)
MCHC: 33.9 g/dL (ref 30.0–36.0)
MCV: 88.4 fl (ref 78.0–100.0)
Monocytes Absolute: 0.4 K/uL (ref 0.1–1.0)
Monocytes Relative: 7 % (ref 3.0–12.0)
Neutro Abs: 4.7 K/uL (ref 1.4–7.7)
Neutrophils Relative %: 73.5 % (ref 43.0–77.0)
Platelets: 246 K/uL (ref 150.0–400.0)
RBC: 4.6 Mil/uL (ref 3.87–5.11)
RDW: 14.9 % (ref 11.5–15.5)
WBC: 6.4 K/uL (ref 4.0–10.5)

## 2024-06-30 LAB — COMPREHENSIVE METABOLIC PANEL WITH GFR
ALT: 13 U/L (ref 3–35)
AST: 19 U/L (ref 5–37)
Albumin: 4.3 g/dL (ref 3.5–5.2)
Alkaline Phosphatase: 53 U/L (ref 39–117)
BUN: 14 mg/dL (ref 6–23)
CO2: 25 meq/L (ref 19–32)
Calcium: 9.5 mg/dL (ref 8.4–10.5)
Chloride: 99 meq/L (ref 96–112)
Creatinine, Ser: 0.7 mg/dL (ref 0.40–1.20)
GFR: 85.24 mL/min (ref >60.00)
Glucose, Bld: 115 mg/dL — ABNORMAL HIGH (ref 70–99)
Potassium: 4.3 meq/L (ref 3.5–5.1)
Sodium: 132 meq/L — ABNORMAL LOW (ref 135–145)
Total Bilirubin: 0.5 mg/dL (ref 0.2–1.2)
Total Protein: 7.3 g/dL (ref 6.0–8.3)

## 2024-06-30 LAB — LIPID PANEL
Cholesterol: 171 mg/dL (ref 28–200)
HDL: 52.3 mg/dL (ref >39.00)
LDL Cholesterol: 101 mg/dL — ABNORMAL HIGH (ref 10–99)
NonHDL: 118.44
Total CHOL/HDL Ratio: 3
Triglycerides: 89 mg/dL (ref 10.0–149.0)
VLDL: 17.8 mg/dL (ref 0.0–40.0)

## 2024-06-30 LAB — TSH: TSH: 1.42 u[IU]/mL (ref 0.35–5.50)

## 2024-06-30 LAB — VITAMIN D 25 HYDROXY (VIT D DEFICIENCY, FRACTURES): VITD: 38.83 ng/mL (ref 30.00–100.00)

## 2024-06-30 MED ORDER — FAMOTIDINE 20 MG PO TABS: 20.0000 mg | ORAL_TABLET | Freq: Two times a day (BID) | ORAL | Status: DC | PRN

## 2024-06-30 MED ORDER — ALENDRONATE SODIUM 70 MG PO TABS: 70.0000 mg | ORAL_TABLET | ORAL | 1 refills | Status: DC

## 2024-06-30 MED ORDER — EZETIMIBE 10 MG PO TABS: 10.0000 mg | ORAL_TABLET | Freq: Every day | ORAL | 1 refills | Status: AC

## 2024-06-30 MED ORDER — FAMOTIDINE 20 MG PO TABS: 20.0000 mg | ORAL_TABLET | Freq: Two times a day (BID) | ORAL | 2 refills | Status: AC | PRN

## 2024-06-30 MED ORDER — DILTIAZEM HCL ER BEADS 180 MG PO CP24: 180.0000 mg | ORAL_CAPSULE | Freq: Every day | ORAL | 3 refills | Status: AC

## 2024-06-30 NOTE — Assessment & Plan Note (Signed)
 Chronic Continues to work on smoking cessation-encouraged complete cessation

## 2024-06-30 NOTE — Assessment & Plan Note (Signed)
 Chronic BP well controlled CBC, CMP Continue tiadylt  ER 180 mg daily

## 2024-06-30 NOTE — Assessment & Plan Note (Addendum)
 Chronic GERD controlled Continue famotidine  20 mg bid - can take prn

## 2024-06-30 NOTE — Assessment & Plan Note (Addendum)
 Chronic Increased anxiety/stress Not controlled Continue lorazepam  0.5 mg every 8 hours as needed - typically takes it once daily -- occasionally bid Discussed adding in daily SRI but she deferred

## 2024-06-30 NOTE — Assessment & Plan Note (Signed)
 Chronic Regular exercise and healthy diet encouraged Check lipid panel, tsh, CMP ASCVD risk is high Statin intolerant Continue zetia  10 mg daily

## 2024-07-03 ENCOUNTER — Ambulatory Visit: Payer: Self-pay | Admitting: Internal Medicine

## 2024-07-03 DIAGNOSIS — E871 Hypo-osmolality and hyponatremia: Secondary | ICD-10-CM

## 2024-07-05 ENCOUNTER — Other Ambulatory Visit

## 2024-07-05 DIAGNOSIS — E871 Hypo-osmolality and hyponatremia: Secondary | ICD-10-CM

## 2024-07-05 LAB — BASIC METABOLIC PANEL WITH GFR
BUN: 12 mg/dL (ref 6–23)
CO2: 26 meq/L (ref 19–32)
Calcium: 9.8 mg/dL (ref 8.4–10.5)
Chloride: 100 meq/L (ref 96–112)
Creatinine, Ser: 0.73 mg/dL (ref 0.40–1.20)
GFR: 81.05 mL/min
Glucose, Bld: 88 mg/dL (ref 70–99)
Potassium: 4.3 meq/L (ref 3.5–5.1)
Sodium: 134 meq/L — ABNORMAL LOW (ref 135–145)

## 2024-07-06 ENCOUNTER — Ambulatory Visit: Payer: Self-pay | Admitting: Internal Medicine

## 2024-07-07 ENCOUNTER — Other Ambulatory Visit: Payer: Self-pay | Admitting: Internal Medicine

## 2024-07-08 ENCOUNTER — Encounter: Payer: Medicare HMO | Admitting: Internal Medicine

## 2024-08-09 ENCOUNTER — Encounter: Admitting: Internal Medicine

## 2025-07-10 ENCOUNTER — Encounter: Admitting: Internal Medicine
